# Patient Record
Sex: Male | Born: 1935
Health system: Southern US, Community
[De-identification: ages and names within clinical notes are randomized; demographics above are authoritative.]

## PROBLEM LIST (undated history)

## (undated) DIAGNOSIS — E785 Hyperlipidemia, unspecified: Secondary | ICD-10-CM

## (undated) DIAGNOSIS — I10 Essential (primary) hypertension: Secondary | ICD-10-CM

## (undated) DIAGNOSIS — N400 Enlarged prostate without lower urinary tract symptoms: Secondary | ICD-10-CM

## (undated) HISTORY — DX: Essential (primary) hypertension: I10

## (undated) HISTORY — PX: EXPLORATORY LAPAROTOMY: SUR591

## (undated) HISTORY — DX: Hyperlipidemia, unspecified: E78.5

## (undated) HISTORY — PX: FINGER SURGERY: SHX640

## (undated) HISTORY — DX: Benign prostatic hyperplasia without lower urinary tract symptoms: N40.0

---

## 2011-04-12 DIAGNOSIS — N400 Enlarged prostate without lower urinary tract symptoms: Secondary | ICD-10-CM | POA: Diagnosis not present

## 2011-04-12 DIAGNOSIS — I10 Essential (primary) hypertension: Secondary | ICD-10-CM | POA: Diagnosis not present

## 2011-04-12 DIAGNOSIS — E785 Hyperlipidemia, unspecified: Secondary | ICD-10-CM | POA: Diagnosis not present

## 2011-04-14 DIAGNOSIS — L259 Unspecified contact dermatitis, unspecified cause: Secondary | ICD-10-CM | POA: Diagnosis not present

## 2011-04-14 DIAGNOSIS — Z1212 Encounter for screening for malignant neoplasm of rectum: Secondary | ICD-10-CM | POA: Diagnosis not present

## 2011-05-12 DIAGNOSIS — L259 Unspecified contact dermatitis, unspecified cause: Secondary | ICD-10-CM | POA: Diagnosis not present

## 2011-07-25 DIAGNOSIS — E785 Hyperlipidemia, unspecified: Secondary | ICD-10-CM | POA: Diagnosis not present

## 2011-07-25 DIAGNOSIS — I10 Essential (primary) hypertension: Secondary | ICD-10-CM | POA: Diagnosis not present

## 2011-08-11 DIAGNOSIS — D235 Other benign neoplasm of skin of trunk: Secondary | ICD-10-CM | POA: Diagnosis not present

## 2011-08-11 DIAGNOSIS — L259 Unspecified contact dermatitis, unspecified cause: Secondary | ICD-10-CM | POA: Diagnosis not present

## 2011-09-07 DIAGNOSIS — E785 Hyperlipidemia, unspecified: Secondary | ICD-10-CM | POA: Diagnosis not present

## 2011-09-07 DIAGNOSIS — I10 Essential (primary) hypertension: Secondary | ICD-10-CM | POA: Diagnosis not present

## 2011-10-26 DIAGNOSIS — L259 Unspecified contact dermatitis, unspecified cause: Secondary | ICD-10-CM | POA: Diagnosis not present

## 2011-11-03 DIAGNOSIS — M542 Cervicalgia: Secondary | ICD-10-CM | POA: Diagnosis not present

## 2011-11-09 DIAGNOSIS — H903 Sensorineural hearing loss, bilateral: Secondary | ICD-10-CM | POA: Diagnosis not present

## 2011-12-21 DIAGNOSIS — E785 Hyperlipidemia, unspecified: Secondary | ICD-10-CM | POA: Diagnosis not present

## 2011-12-21 DIAGNOSIS — M542 Cervicalgia: Secondary | ICD-10-CM | POA: Diagnosis not present

## 2011-12-21 DIAGNOSIS — I10 Essential (primary) hypertension: Secondary | ICD-10-CM | POA: Diagnosis not present

## 2011-12-21 DIAGNOSIS — E559 Vitamin D deficiency, unspecified: Secondary | ICD-10-CM | POA: Diagnosis not present

## 2011-12-21 DIAGNOSIS — Z23 Encounter for immunization: Secondary | ICD-10-CM | POA: Diagnosis not present

## 2012-02-05 DIAGNOSIS — Z79899 Other long term (current) drug therapy: Secondary | ICD-10-CM | POA: Diagnosis not present

## 2012-02-05 DIAGNOSIS — I10 Essential (primary) hypertension: Secondary | ICD-10-CM | POA: Diagnosis not present

## 2012-03-20 DIAGNOSIS — Z125 Encounter for screening for malignant neoplasm of prostate: Secondary | ICD-10-CM | POA: Diagnosis not present

## 2012-03-20 DIAGNOSIS — E785 Hyperlipidemia, unspecified: Secondary | ICD-10-CM | POA: Diagnosis not present

## 2012-03-20 DIAGNOSIS — E559 Vitamin D deficiency, unspecified: Secondary | ICD-10-CM | POA: Diagnosis not present

## 2012-03-20 DIAGNOSIS — I10 Essential (primary) hypertension: Secondary | ICD-10-CM | POA: Diagnosis not present

## 2012-04-24 ENCOUNTER — Telehealth: Payer: Self-pay | Admitting: *Deleted

## 2012-04-24 NOTE — Telephone Encounter (Signed)
Verbal order from Gennette Pac, FNP for patient to try OTC Benadryl 25mg  instead of hydroxyzine.

## 2012-04-24 NOTE — Telephone Encounter (Signed)
Medicare denied hydroxyzine and pt wants a cheaper medication. Can you help out with this please?

## 2012-04-25 NOTE — Telephone Encounter (Signed)
Talked with pts son this am with pts  Permission and told him to get benadryl 25mg  for father and let us know if it does not work,

## 2012-05-01 DIAGNOSIS — R351 Nocturia: Secondary | ICD-10-CM | POA: Diagnosis not present

## 2012-05-01 DIAGNOSIS — R3915 Urgency of urination: Secondary | ICD-10-CM | POA: Diagnosis not present

## 2012-05-01 DIAGNOSIS — R972 Elevated prostate specific antigen [PSA]: Secondary | ICD-10-CM | POA: Diagnosis not present

## 2012-05-01 DIAGNOSIS — R39198 Other difficulties with micturition: Secondary | ICD-10-CM | POA: Diagnosis not present

## 2012-05-14 ENCOUNTER — Other Ambulatory Visit: Payer: Self-pay | Admitting: *Deleted

## 2012-05-14 MED ORDER — MELOXICAM 7.5 MG PO TABS: 7.5000 mg | ORAL_TABLET | Freq: Every day | ORAL | Status: DC

## 2012-05-16 ENCOUNTER — Other Ambulatory Visit: Payer: Self-pay

## 2012-05-16 MED ORDER — CLOBETASOL PROP EMOLLIENT BASE 0.05 % EX CREA: 1.0000 [drp] | TOPICAL_CREAM | Freq: Two times a day (BID) | CUTANEOUS | Status: DC

## 2012-05-27 DIAGNOSIS — R972 Elevated prostate specific antigen [PSA]: Secondary | ICD-10-CM | POA: Diagnosis not present

## 2012-05-31 DIAGNOSIS — R3915 Urgency of urination: Secondary | ICD-10-CM | POA: Diagnosis not present

## 2012-05-31 DIAGNOSIS — S61209A Unspecified open wound of unspecified finger without damage to nail, initial encounter: Secondary | ICD-10-CM | POA: Diagnosis not present

## 2012-05-31 DIAGNOSIS — IMO0002 Reserved for concepts with insufficient information to code with codable children: Secondary | ICD-10-CM | POA: Diagnosis not present

## 2012-05-31 DIAGNOSIS — S62609B Fracture of unspecified phalanx of unspecified finger, initial encounter for open fracture: Secondary | ICD-10-CM | POA: Diagnosis not present

## 2012-05-31 DIAGNOSIS — T148XXA Other injury of unspecified body region, initial encounter: Secondary | ICD-10-CM | POA: Diagnosis not present

## 2012-05-31 DIAGNOSIS — R972 Elevated prostate specific antigen [PSA]: Secondary | ICD-10-CM | POA: Diagnosis not present

## 2012-05-31 DIAGNOSIS — R351 Nocturia: Secondary | ICD-10-CM | POA: Diagnosis not present

## 2012-06-03 DIAGNOSIS — S61209A Unspecified open wound of unspecified finger without damage to nail, initial encounter: Secondary | ICD-10-CM | POA: Diagnosis not present

## 2012-06-05 DIAGNOSIS — Y998 Other external cause status: Secondary | ICD-10-CM | POA: Diagnosis not present

## 2012-06-05 DIAGNOSIS — Y9389 Activity, other specified: Secondary | ICD-10-CM | POA: Diagnosis not present

## 2012-06-05 DIAGNOSIS — IMO0002 Reserved for concepts with insufficient information to code with codable children: Secondary | ICD-10-CM | POA: Diagnosis not present

## 2012-06-05 DIAGNOSIS — Y92009 Unspecified place in unspecified non-institutional (private) residence as the place of occurrence of the external cause: Secondary | ICD-10-CM | POA: Diagnosis not present

## 2012-06-05 DIAGNOSIS — W309XXA Contact with unspecified agricultural machinery, initial encounter: Secondary | ICD-10-CM | POA: Diagnosis not present

## 2012-06-05 DIAGNOSIS — S61209A Unspecified open wound of unspecified finger without damage to nail, initial encounter: Secondary | ICD-10-CM | POA: Diagnosis not present

## 2012-06-11 ENCOUNTER — Other Ambulatory Visit: Payer: Self-pay | Admitting: Family Medicine

## 2012-06-12 NOTE — Telephone Encounter (Signed)
LAST OV 2/14. 

## 2012-06-14 DIAGNOSIS — Z4889 Encounter for other specified surgical aftercare: Secondary | ICD-10-CM | POA: Diagnosis not present

## 2012-07-01 ENCOUNTER — Other Ambulatory Visit: Payer: Self-pay

## 2012-07-01 MED ORDER — LISINOPRIL 40 MG PO TABS: 40.0000 mg | ORAL_TABLET | Freq: Every day | ORAL | Status: DC

## 2012-07-01 MED ORDER — HYDROCHLOROTHIAZIDE 25 MG PO TABS: 25.0000 mg | ORAL_TABLET | Freq: Every day | ORAL | Status: DC

## 2012-07-04 DIAGNOSIS — Z4889 Encounter for other specified surgical aftercare: Secondary | ICD-10-CM | POA: Diagnosis not present

## 2012-07-19 ENCOUNTER — Ambulatory Visit: Payer: Self-pay | Admitting: Nurse Practitioner

## 2012-07-23 ENCOUNTER — Encounter: Payer: Self-pay | Admitting: Family Medicine

## 2012-07-23 ENCOUNTER — Ambulatory Visit (INDEPENDENT_AMBULATORY_CARE_PROVIDER_SITE_OTHER): Payer: Medicare Other | Admitting: Family Medicine

## 2012-07-23 ENCOUNTER — Ambulatory Visit: Payer: Self-pay | Admitting: Family Medicine

## 2012-07-23 VITALS — BP 140/66 | HR 57 | Temp 97.0°F | Ht 64.0 in | Wt 136.4 lb

## 2012-07-23 DIAGNOSIS — I10 Essential (primary) hypertension: Secondary | ICD-10-CM | POA: Insufficient documentation

## 2012-07-23 DIAGNOSIS — N400 Enlarged prostate without lower urinary tract symptoms: Secondary | ICD-10-CM | POA: Diagnosis not present

## 2012-07-23 DIAGNOSIS — E785 Hyperlipidemia, unspecified: Secondary | ICD-10-CM | POA: Diagnosis not present

## 2012-07-23 DIAGNOSIS — M542 Cervicalgia: Secondary | ICD-10-CM | POA: Insufficient documentation

## 2012-07-23 LAB — POCT CBC
Granulocyte percent: 63.8 %G (ref 37–80)
HCT, POC: 40.7 % (ref 37.7–47.9)
Hemoglobin: 13.9 g/dL (ref 12.2–16.2)
Lymph, poc: 2.1 (ref 0.6–3.4)
MCH, POC: 31.9 pg — AB (ref 27–31.2)
MCHC: 34.2 g/dL (ref 31.8–35.4)
MCV: 93.3 fL (ref 80–97)
MPV: 7.9 fL (ref 0–99.8)
POC Granulocyte: 4.7 (ref 2–6.9)
POC LYMPH PERCENT: 28 %L (ref 10–50)
Platelet Count, POC: 203 10*3/uL (ref 142–424)
RBC: 4.4 M/uL (ref 4.04–5.48)
RDW, POC: 13.3 %
WBC: 7.4 10*3/uL (ref 4.6–10.2)

## 2012-07-23 MED ORDER — CYCLOBENZAPRINE HCL 5 MG PO TABS: 5.0000 mg | ORAL_TABLET | Freq: Three times a day (TID) | ORAL | Status: DC | PRN

## 2012-07-23 MED ORDER — MELOXICAM 7.5 MG PO TABS: 7.5000 mg | ORAL_TABLET | Freq: Every day | ORAL | Status: DC

## 2012-07-23 MED ORDER — LISINOPRIL 40 MG PO TABS: 40.0000 mg | ORAL_TABLET | Freq: Every day | ORAL | Status: DC

## 2012-07-23 MED ORDER — HYDROCHLOROTHIAZIDE 25 MG PO TABS: 25.0000 mg | ORAL_TABLET | Freq: Every day | ORAL | Status: DC

## 2012-07-23 MED ORDER — ATORVASTATIN CALCIUM 40 MG PO TABS: 40.0000 mg | ORAL_TABLET | Freq: Every day | ORAL | Status: DC

## 2012-07-23 NOTE — Progress Notes (Signed)
  Subjective:    Patient ID: Darren Rose, male    DOB: 1931-02-24, 79 y.o.   MRN: [id number]  HPI  This 79 y.o. male presents for evaluation of BPH, Hyperlipidemia, hypertension, and c/o headache which is band like.  He has hx of cervicalgia and OA.  Review of Systems    No chest pain, SOB, HA, dizziness, vision change, N/V, diarrhea, constipation, dysuria, urinary urgency or frequency, myalgias, arthralgias or rash.  Objective:   Physical Exam  Vital signs noted  Well developed well nourished male.  HEENT - Head atraumatic Normocephalic                Eyes - PERRLA, Conjuctiva - clear Sclera- Clear EOMI                Ears - EAC's Wnl TM's Wnl Gross Hearing WNL                Nose - Nares patent                 Throat - oropharanx wnl Respiratory - Lungs CTA bilateral Cardiac - RRR S1 and S2 without murmur GI - Abdomen soft Nontender and bowel sounds active x 4 Extremities - No edema. Neuro - Grossly intact.      Assessment & Plan:  Essential hypertension, benign - Plan: POCT CBC, COMPLETE METABOLIC PANEL WITH GFR Controlled and continue current.  BPH (benign prostatic hyperplasia) - Plan: POCT CBC, COMPLETE METABOLIC PANEL WITH GFR No increased nocturia.    Cervicalgia - Plan: Continue Mobic and take flexeril otc and this will also help with his headache.  Other and unspecified hyperlipidemia - Plan: POCT CBC, COMPLETE METABOLIC PANEL WITH GFR.  Continue atorvastatin.

## 2012-07-23 NOTE — Patient Instructions (Signed)

## 2012-07-24 LAB — COMPLETE METABOLIC PANEL WITH GFR
ALT: 23 U/L (ref 0–35)
AST: 24 U/L (ref 0–37)
Albumin: 4 g/dL (ref 3.5–5.2)
Alkaline Phosphatase: 64 U/L (ref 39–117)
BUN: 22 mg/dL (ref 6–23)
CO2: 30 mEq/L (ref 19–32)
Calcium: 9.8 mg/dL (ref 8.4–10.5)
Chloride: 100 mEq/L (ref 96–112)
Creat: 0.65 mg/dL (ref 0.50–1.10)
GFR, Est African American: >89 mL/min
GFR, Est Non African American: 85 mL/min
Glucose, Bld: 88 mg/dL (ref 70–99)
Potassium: 4.4 mEq/L (ref 3.5–5.3)
Sodium: 143 mEq/L (ref 135–145)
Total Bilirubin: 0.5 mg/dL (ref 0.3–1.2)
Total Protein: 6.7 g/dL (ref 6.0–8.3)

## 2012-07-24 NOTE — Progress Notes (Signed)
  Subjective:    Patient ID: Darren Rose, male    DOB: 1931/04/06, 79 y.o.   MRN: [id number]  HPI    Review of Systems     Objective:   Physical Exam   Vital signs noted  Well developed well nourished male in NAD.  HEENT - Head atraumatic Normocephalic                Eyes - PERRLA, Conjuctiva - clear Sclera- Clear EOMI                Ears - EAC's Wnl TM's Wnl Gross Hearing WNL                Nose - Nares patent                 Throat - oropharanx wnl Respiratory - Lungs CTA bilateral Cardiac - RRR S1 and S2 without murmur GI - Abdomen soft Nontender and bowel sounds active x 4 Extremities - No edema. Neuro - Grossly intact.     Assessment & Plan:  Patient had wrong dx of BPH entered.

## 2012-08-09 ENCOUNTER — Other Ambulatory Visit: Payer: Self-pay

## 2012-08-09 MED ORDER — ATORVASTATIN CALCIUM 40 MG PO TABS: 40.0000 mg | ORAL_TABLET | Freq: Every day | ORAL | Status: DC

## 2012-08-14 ENCOUNTER — Other Ambulatory Visit: Payer: Self-pay | Admitting: Nurse Practitioner

## 2012-09-06 ENCOUNTER — Ambulatory Visit: Payer: Self-pay | Admitting: Nurse Practitioner

## 2012-10-02 ENCOUNTER — Other Ambulatory Visit: Payer: Self-pay | Admitting: Family Medicine

## 2012-10-29 ENCOUNTER — Other Ambulatory Visit: Payer: Self-pay | Admitting: Family Medicine

## 2012-11-06 ENCOUNTER — Other Ambulatory Visit: Payer: Self-pay | Admitting: Family Medicine

## 2012-11-22 ENCOUNTER — Ambulatory Visit (INDEPENDENT_AMBULATORY_CARE_PROVIDER_SITE_OTHER): Payer: Medicare Other | Admitting: Family Medicine

## 2012-11-22 VITALS — BP 132/73 | HR 60 | Temp 97.8°F | Ht 64.0 in | Wt 140.0 lb

## 2012-11-22 DIAGNOSIS — M129 Arthropathy, unspecified: Secondary | ICD-10-CM | POA: Diagnosis not present

## 2012-11-22 DIAGNOSIS — N400 Enlarged prostate without lower urinary tract symptoms: Secondary | ICD-10-CM | POA: Diagnosis not present

## 2012-11-22 DIAGNOSIS — M1990 Unspecified osteoarthritis, unspecified site: Secondary | ICD-10-CM

## 2012-11-22 DIAGNOSIS — I10 Essential (primary) hypertension: Secondary | ICD-10-CM | POA: Diagnosis not present

## 2012-11-22 LAB — POCT CBC
Granulocyte percent: 64 %G (ref 37–80)
HCT, POC: 42.1 % — AB (ref 43.5–53.7)
Hemoglobin: 14 g/dL — AB (ref 14.1–18.1)
Lymph, poc: 1.9 (ref 0.6–3.4)
MCH, POC: 30.7 pg (ref 27–31.2)
MCHC: 33.3 g/dL (ref 31.8–35.4)
MCV: 92.2 fL (ref 80–97)
MPV: 8.3 fL (ref 0–99.8)
POC Granulocyte: 4.2 (ref 2–6.9)
POC LYMPH PERCENT: 29 %L (ref 10–50)
Platelet Count, POC: 192 10*3/uL (ref 142–424)
RBC: 4.6 M/uL — AB (ref 4.69–6.13)
RDW, POC: 13.2 %
WBC: 6.6 10*3/uL (ref 4.6–10.2)

## 2012-11-22 MED ORDER — TAMSULOSIN HCL 0.4 MG PO CAPS: 0.4000 mg | ORAL_CAPSULE | Freq: Every day | ORAL | Status: DC

## 2012-11-22 MED ORDER — MELOXICAM 7.5 MG PO TABS: 7.5000 mg | ORAL_TABLET | Freq: Every day | ORAL | Status: DC

## 2012-11-22 MED ORDER — CYCLOBENZAPRINE HCL 5 MG PO TABS: 5.0000 mg | ORAL_TABLET | Freq: Two times a day (BID) | ORAL | Status: DC | PRN

## 2012-11-22 NOTE — Patient Instructions (Signed)

## 2012-11-22 NOTE — Progress Notes (Signed)
  Subjective:    Patient ID: Darren Rose, male    DOB: 1931/03/22, 79 y.o.   MRN: [id number]  HPI This 79 y.o. male presents for evaluation of follow up.  He has Hx of hypertension, hyperlipidemia,and OA..   Review of Systems No chest pain, SOB, HA, dizziness, vision change, N/V, diarrhea, constipation, dysuria, urinary urgency or frequency, myalgias, arthralgias or rash.     Objective:   Physical Exam Vital signs noted  Well developed well nourished male.  HEENT - Head atraumatic Normocephalic                Eyes - PERRLA, Conjuctiva - clear Sclera- Clear EOMI                Ears - EAC's Wnl TM's Wnl Gross Hearing WNL                Nose - Nares patent                 Throat - oropharanx wnl Respiratory - Lungs CTA bilateral Cardiac - RRR S1 and S2 without murmur GI - Abdomen soft Nontender and bowel sounds active x 4 Extremities - No edema. Neuro - Grossly intact.       Assessment & Plan:  BPH (benign prostatic hyperplasia) - Plan: tamsulosin (FLOMAX) 0.4 MG CAPS capsule, POCT CBC, CMP14+EGFR  Arthritis - Plan: meloxicam (MOBIC) 7.5 MG tablet, cyclobenzaprine (FLEXERIL) 5 MG tablet, POCT CBC, CMP14+EGFR  Essential hypertension, benign - Plan: POCT CBC, CMP14+EGFR  Deatra Canter FNP

## 2012-11-23 LAB — CMP14+EGFR
ALT: 18 IU/L (ref 0–44)
AST: 20 IU/L (ref 0–40)
Albumin/Globulin Ratio: 1.9 (ref 1.1–2.5)
Albumin: 4.3 g/dL (ref 3.5–4.8)
Alkaline Phosphatase: 81 IU/L (ref 39–117)
BUN/Creatinine Ratio: 40 — ABNORMAL HIGH (ref 10–22)
BUN: 27 mg/dL (ref 8–27)
CO2: 28 mmol/L (ref 18–29)
Calcium: 9.6 mg/dL (ref 8.6–10.2)
Chloride: 99 mmol/L (ref 97–108)
Creatinine, Ser: 0.67 mg/dL — ABNORMAL LOW (ref 0.76–1.27)
GFR calc Af Amer: 106 mL/min/{1.73_m2} (ref >59)
GFR calc non Af Amer: 91 mL/min/{1.73_m2} (ref >59)
Globulin, Total: 2.3 g/dL (ref 1.5–4.5)
Glucose: 95 mg/dL (ref 65–99)
Potassium: 4.6 mmol/L (ref 3.5–5.2)
Sodium: 142 mmol/L (ref 134–144)
Total Bilirubin: 0.4 mg/dL (ref 0.0–1.2)
Total Protein: 6.6 g/dL (ref 6.0–8.5)

## 2012-12-23 ENCOUNTER — Other Ambulatory Visit: Payer: Self-pay

## 2012-12-23 MED ORDER — ATORVASTATIN CALCIUM 40 MG PO TABS: 40.0000 mg | ORAL_TABLET | Freq: Every day | ORAL | Status: DC

## 2012-12-23 NOTE — Telephone Encounter (Signed)
ntbs

## 2012-12-23 NOTE — Telephone Encounter (Signed)
Last lipid 03/20/12  [facility]

## 2012-12-24 ENCOUNTER — Telehealth: Payer: Self-pay | Admitting: *Deleted

## 2012-12-24 NOTE — Telephone Encounter (Signed)
Left message,NTBS.

## 2013-01-20 ENCOUNTER — Ambulatory Visit: Payer: Medicare Other | Admitting: Family Medicine

## 2013-01-21 ENCOUNTER — Ambulatory Visit (INDEPENDENT_AMBULATORY_CARE_PROVIDER_SITE_OTHER): Payer: Medicare Other | Admitting: General Practice

## 2013-01-21 ENCOUNTER — Ambulatory Visit (INDEPENDENT_AMBULATORY_CARE_PROVIDER_SITE_OTHER): Payer: Medicare Other

## 2013-01-21 ENCOUNTER — Encounter: Payer: Self-pay | Admitting: General Practice

## 2013-01-21 VITALS — BP 144/65 | HR 60 | Temp 97.3°F | Ht 64.0 in | Wt 138.0 lb

## 2013-01-21 DIAGNOSIS — R52 Pain, unspecified: Secondary | ICD-10-CM | POA: Diagnosis not present

## 2013-01-21 DIAGNOSIS — Z23 Encounter for immunization: Secondary | ICD-10-CM

## 2013-01-21 DIAGNOSIS — L84 Corns and callosities: Secondary | ICD-10-CM | POA: Diagnosis not present

## 2013-01-21 MED ORDER — ATORVASTATIN CALCIUM 40 MG PO TABS: 40.0000 mg | ORAL_TABLET | Freq: Every day | ORAL | Status: DC

## 2013-01-21 NOTE — Patient Instructions (Signed)
Callos y callosidades (Corns and Calluses) Un callo es una pequea zona de piel ms gruesa que se desarrolla en la parte superior, los costados, y las puntas de un dedo del pie. Contienen un ncleo con forma de cono que puede presionar un nervio que se encuentre debajo y causar dolor. Los callos son reas de piel engrosada que se desarrollan en las manos, los dedos, las palmas de las manos, las plantas de los pies y los talones. Estas son las reas que experimentan friccin o presin frecuente.  CAUSAS  Los callos son generalmente el resultado de roce (friccin) o la presin de los zapatos que son demasiado apretados o no se ajustan adecuadamente. Los callos son causados   por la friccin y la presin repetida sobre las zonas afectadas.  SNTOMAS  Un crecimiento de piel dura en los dedos de los pies.  Dolor o sensibilidad en la piel.  A veces, enrojecimiento e hinchazn.  Mayor dolor con el uso de zapatos ajustados. DIAGNSTICO  El mdico puede diagnosticar el problema haciendo un examen fsico.  TRATAMIENTO  Eliminacin de la causa de la friccin o la presin es generalmente el nico tratamiento necesario. Sin embargo, a veces pueden utilizarse medicamentos para ayudar a ablandar las zonas endurecidas y engrosadas. Estos medicamentos incluyen apsitos de cido saliclico y una locin de lactato de amonio al 12%. Estos medicamentos slo deben utilizarse bajo la supervisin de su mdico.  INSTRUCCIONES PARA EL CUIDADO DOMICILIARIO  Intente eliminar la presin de la zona afectada.  Puede proteger la piel con almohadillas para callos con forma de aro.  Puede usar una piedra pmez o una lima de uas metlica con suavidad para reducir el grosor del callo.  Use calzado bien ajustado.  Si tiene callos en las manos, utilice guantes al realizar actividades que puedan causar friccin.  Las personas diabticas deber controlar regularmente sus pies y comunicarse con el mdico de cabecera si notan  problemas en ellos. SOLICITE ATENCIN MDICA DE INMEDIATO SI:  Ha aumentado el dolor, hinchazn, enrojecimiento o calor en la zona afectada.  Su callo comienza a drenar el lquido o sangra.  Usted no est mejorando, incluso con tratamiento. Document Released: 10/26/2007 Document Revised: 04/10/2011 [employer] Patient Information 2014 [employer].  

## 2013-01-21 NOTE — Progress Notes (Signed)
   Subjective:    Patient ID: Darren Rose, male    DOB: 01/29/1932, 79 y.o.   MRN: [id number]  HPI Patient presents today today with complaints of right foot pain, between 3rd and 4th toe. Reports onset was several months ago and he has used OTC medication for callus removal. Reports also using knife to trim callus area down.  Patient is accompanied by his family and a Nurse, learning disability, due to being spanish speaking only.    Review of Systems  Constitutional: Negative for fever and chills.  Respiratory: Negative for chest tightness and shortness of breath.   Cardiovascular: Negative for chest pain and palpitations.  Musculoskeletal:       Right foot pain, between 3rd and 4th toes  All other systems reviewed and are negative.       Objective:   Physical Exam  Constitutional: He is oriented to person, place, and time. He appears well-developed and well-nourished.  Cardiovascular: Normal rate, regular rhythm and normal heart sounds.   Pulmonary/Chest: Effort normal and breath sounds normal. No respiratory distress. He exhibits no tenderness.  Musculoskeletal:  Callous noted to right foot 3rd toe and pressing against 4th toe. Negative broken skin to 4th toe.   Neurological: He is alert and oriented to person, place, and time.  Skin: Skin is warm and dry.  Psychiatric: He has a normal mood and affect.    [facility] reading (PRIMARY) by Coralie Keens, FNP-C, no fracture, dislocation, or bone spur noted.                                     Assessment & Plan:  1. Pain  - DG Foot Complete Right; Future  2. Callus of foot  - Ambulatory referral to Podiatry -keep feet clean and dry -information provided and discussed about callus -RTO if symptoms worsen, prior to podiatrist visit Patient verbalized understanding Coralie Keens, FNP-C

## 2013-02-03 DIAGNOSIS — L259 Unspecified contact dermatitis, unspecified cause: Secondary | ICD-10-CM | POA: Diagnosis not present

## 2013-02-12 ENCOUNTER — Ambulatory Visit: Payer: Medicare Other | Admitting: Podiatry

## 2013-02-18 DIAGNOSIS — M79609 Pain in unspecified limb: Secondary | ICD-10-CM | POA: Diagnosis not present

## 2013-02-18 DIAGNOSIS — M2040 Other hammer toe(s) (acquired), unspecified foot: Secondary | ICD-10-CM | POA: Diagnosis not present

## 2013-02-26 ENCOUNTER — Other Ambulatory Visit: Payer: Self-pay | Admitting: Family Medicine

## 2013-02-28 ENCOUNTER — Other Ambulatory Visit: Payer: Self-pay | Admitting: *Deleted

## 2013-02-28 MED ORDER — ATORVASTATIN CALCIUM 40 MG PO TABS
40.0000 mg | ORAL_TABLET | Freq: Every day | ORAL | Status: DC
Start: 2013-02-28 — End: 2013-04-24

## 2013-03-11 DIAGNOSIS — M779 Enthesopathy, unspecified: Secondary | ICD-10-CM | POA: Diagnosis not present

## 2013-03-11 DIAGNOSIS — M79609 Pain in unspecified limb: Secondary | ICD-10-CM | POA: Diagnosis not present

## 2013-04-01 DIAGNOSIS — M779 Enthesopathy, unspecified: Secondary | ICD-10-CM | POA: Diagnosis not present

## 2013-04-01 DIAGNOSIS — M79609 Pain in unspecified limb: Secondary | ICD-10-CM | POA: Diagnosis not present

## 2013-04-01 DIAGNOSIS — M25579 Pain in unspecified ankle and joints of unspecified foot: Secondary | ICD-10-CM | POA: Diagnosis not present

## 2013-04-24 ENCOUNTER — Other Ambulatory Visit: Payer: Self-pay | Admitting: *Deleted

## 2013-04-24 MED ORDER — ATORVASTATIN CALCIUM 40 MG PO TABS: 40.0000 mg | ORAL_TABLET | Freq: Every day | ORAL | Status: DC

## 2013-04-29 DIAGNOSIS — M25579 Pain in unspecified ankle and joints of unspecified foot: Secondary | ICD-10-CM | POA: Diagnosis not present

## 2013-04-29 DIAGNOSIS — M779 Enthesopathy, unspecified: Secondary | ICD-10-CM | POA: Diagnosis not present

## 2013-05-13 DIAGNOSIS — M2040 Other hammer toe(s) (acquired), unspecified foot: Secondary | ICD-10-CM | POA: Diagnosis not present

## 2013-05-22 DIAGNOSIS — M2040 Other hammer toe(s) (acquired), unspecified foot: Secondary | ICD-10-CM | POA: Diagnosis not present

## 2013-05-22 DIAGNOSIS — I10 Essential (primary) hypertension: Secondary | ICD-10-CM | POA: Diagnosis not present

## 2013-05-22 DIAGNOSIS — Z79899 Other long term (current) drug therapy: Secondary | ICD-10-CM | POA: Diagnosis not present

## 2013-05-22 DIAGNOSIS — M129 Arthropathy, unspecified: Secondary | ICD-10-CM | POA: Diagnosis not present

## 2013-05-22 DIAGNOSIS — L84 Corns and callosities: Secondary | ICD-10-CM | POA: Diagnosis not present

## 2013-05-23 DIAGNOSIS — M2040 Other hammer toe(s) (acquired), unspecified foot: Secondary | ICD-10-CM | POA: Diagnosis not present

## 2013-05-26 ENCOUNTER — Encounter: Payer: Medicare Other | Admitting: Family Medicine

## 2013-05-26 ENCOUNTER — Other Ambulatory Visit: Payer: Self-pay | Admitting: Family Medicine

## 2013-05-26 NOTE — Progress Notes (Signed)
   Subjective:    Patient ID: Darren Rose, male    DOB: 12-12-1931, 80 y.o.   MRN: [id number]  HPI    Review of Systems     Objective:   Physical Exam        Assessment & Plan:

## 2013-05-27 NOTE — Telephone Encounter (Signed)
Last seen 01/21/13 Mae  No Lipids in [facility]

## 2013-06-03 DIAGNOSIS — M2040 Other hammer toe(s) (acquired), unspecified foot: Secondary | ICD-10-CM | POA: Diagnosis not present

## 2013-06-04 DIAGNOSIS — R972 Elevated prostate specific antigen [PSA]: Secondary | ICD-10-CM | POA: Diagnosis not present

## 2013-06-09 DIAGNOSIS — R972 Elevated prostate specific antigen [PSA]: Secondary | ICD-10-CM | POA: Diagnosis not present

## 2013-06-17 ENCOUNTER — Encounter: Payer: Self-pay | Admitting: Family

## 2013-06-17 ENCOUNTER — Ambulatory Visit (INDEPENDENT_AMBULATORY_CARE_PROVIDER_SITE_OTHER): Payer: Medicare Other | Admitting: Family

## 2013-06-17 VITALS — BP 136/71 | HR 55 | Temp 97.6°F | Ht 64.0 in | Wt 133.0 lb

## 2013-06-17 DIAGNOSIS — E785 Hyperlipidemia, unspecified: Secondary | ICD-10-CM | POA: Diagnosis not present

## 2013-06-17 DIAGNOSIS — I10 Essential (primary) hypertension: Secondary | ICD-10-CM

## 2013-06-17 DIAGNOSIS — Z23 Encounter for immunization: Secondary | ICD-10-CM

## 2013-06-17 DIAGNOSIS — N400 Enlarged prostate without lower urinary tract symptoms: Secondary | ICD-10-CM | POA: Diagnosis not present

## 2013-06-17 DIAGNOSIS — Z0000 Encounter for general adult medical examination without abnormal findings: Secondary | ICD-10-CM

## 2013-06-17 MED ORDER — TAMSULOSIN HCL 0.4 MG PO CAPS: 0.4000 mg | ORAL_CAPSULE | Freq: Every day | ORAL | Status: DC

## 2013-06-17 MED ORDER — ATORVASTATIN CALCIUM 40 MG PO TABS: 40.0000 mg | ORAL_TABLET | Freq: Every day | ORAL | Status: DC

## 2013-06-17 NOTE — Addendum Note (Signed)
Addended by: Ilean China on: 06/17/2013 04:38 PM   Modules accepted: Orders

## 2013-06-17 NOTE — Patient Instructions (Addendum)
Mantenimiento de [employer]. (Health Maintenance, Males) Un estilo de vida saludable y los cuidados preventivos pueden favorecer la salud y [place].  No deje de [employer] de rutina de la salud, dentales y de Public librarian.  Consuma una dieta saludable. Los [employer], frutas, cereales integrales, productos lcteos descremados y protenas magras contienen los nutrientes que usted necesita y no tienen muchas caloras. Disminuya el consumo de alimentos ricos en grasas slidas, azcar y sal agregadas. Si es necesario, pdale informacin acerca de [place] a su mdico.  La actividad fsica regular es una de las cosas ms importantes que puede hacer por su salud. Los adultos deben hacer al menos 150 minutos de ejercicios de intensidad moderada (cualquier actividad que aumente la frecuencia cardaca y lo haga transpirar) todas las semana. Adems, la mayora de los adultos necesita realizar ejercicios de fortalecimiento muscular 2 o ms veces por semana.  Mantenga un peso saludable. El ndice de masa corporal [facility]) es una herramienta que identifica posibles problemas con [place]. Proporciona una estimacin de la grasa corporal basndose en el peso y la altura. El mdico podr determinar su [facility] y ayudarlo a Scientist, forensic o Theatre manager un peso saludable. Para los adultos mayores de 20aos:  Un [place] por debajo de 18,5 se considera bajo peso.  Un [facility] entre 18,5 y 24,9 es normal.  Un IMS entre 25 y 29,9 se considera sobrepeso.  Un IMC por encima de 30 se considera obesidad.  Mantenga un nivel normal de lpidos y colesterol en la sangre practicando actividad fsica y minimizando la ingesta de grasas saturadas. Consuma una dieta balanceada e incluya variedad de frutas y vegetales. A partir de los 20 aos, se deben [employer] de lpidos y Research officer, trade union y repetirse cada 5 aos. Si los niveles de colesterol o lpidos son elevados, tiene ms de 50aos o tiene mayor  riesgo de sufrir enfermedades cardacas, puede necesitar controlarse los niveles de colesterol con ms frecuencia. Si sus niveles elevados de lpidos y colesterol son Cori Razor, y si la dieta y el ejercicio no dan resultado, entonces debe recibir tratamiento con medicamentos.  Si fuma, consulte con el mdico acerca de las opciones para abandonar este hbito. Si no fuma, no comience.  Es recomendable que las personas adultas de entre 69 y 44aos que estn en riesgo de Horticulturist, commercial de pulmn por sus antecedentes de consumo de tabaco, se realicen los exmenes de deteccin correspondientes. Para quienes hayan fumado 30 paquetes por ao y sigan fumando o hayan dejado el hbito en los ltimos 15aos, se recomienda realizarse una tomografa computarizada de baja dosis de los [employer]. Paquete por ao es la forma de medir la cantidad de cigarrillos que una persona ha fumado durante un perodo. Fumar un paquete por ao equivale a fumar un promedio de un paquete de cigarrillos diario durante un ao (por ejemplo, fumar 30paquetes por ao podra significar fumar un paquete de cigarrillos diario durante 30aos o 2paquetes diarios durante 15aos). Se deben realizar estos exmenes todos los aos hasta que el fumador haya dejado de fumar durante un mnimo de 15 aos. No deben realizarse en personas que tengan un problema de salud que les impida recibir tratamiento para el cncer de pulmn.  Si decide tomar alcohol, no beba ms de [employer]. Se considera una medida 12onzas (34m) de cerveza, 5onzas (1572m de vino o 1,5,[id number] licor.  Evite el alcohol y el consumo de drogas.  No comparta agujas. Pida ayuda si necesita asistencia o instrucciones con respecto a abandonar el consumo de alcohol, cigarrillos o drogas.  La hipertensin arterial causa enfermedades cardacas y [place] el riesgo de ictus. Debe controlar su presin arterial al menos cada uno o [place]. La presin  arterial elevada que persiste debe tratarse con medicamentos si la prdida de peso y el ejercicio no son efectivos.  Si tiene entre 44 y 64 aos, consulte a su mdico si debe tomar aspirina para prevenir enfermedades cardacas.  Los anlisis para la diabetes implican tomar Truddie Coco de sangre para controlar el nivel de azcar en la sangre en ayuno. Debe hacerlos cada 3aos despus de los 21aos si su peso es normal y no tiene factores de riesgo de diabetes. Si tiene sobrepeso y cuenta con al menos un factor de riesgo, entonces lo mejor es controlarse y Dispensing optician las pruebas correspondientes desde joven o con ms frecuencia.  El cncer colorrectal puede detectarse y, con frecuencia, puede prevenirse. La mayor parte de los estudios de rutina de control de Surveyor, minerals comienzan a Dispensing optician a los 1 aos y [employer] 37 aos. Sin embargo, el mdico podr aconsejarle que lo haga antes si tiene factores de riesgo de cncer de colon. Una vez por ao, el mdico le dar un kit de prueba casera para Hydrologist en la materia fecal. La utilizacin de una pequea cmara en el extremo de un tubo para examinar directamente el colon (sigmoidoscopia o colonoscopia) puede detectar formas tempranas de cncer colorrectal. Hable con su mdico si tiene 25aos, edad en la que comienzan a realizarse los estudios de [place]. El examen directo del colon debe repetirse cada 5 a 10aos, hasta los 75aos, excepto que se encuentren formas tempranas de plipos precancerosos o pequeas masas.  Las personas con un riesgo mayor de hepatitis B deben realizarse anlisis para Futures trader virus. Se considera que tiene un alto riesgo de hepatitis B si:  Naci en un pas donde la hepatitis B es frecuente. Pregntele a su mdico qu pases son considerados de Public affairs consultant.  Sus padres nacieron en un pas de alto riesgo y usted no recibi la vacuna contra la hepatitis B.  [place].  [place] agujas para  inyectarse drogas ilegales.  Vive o tiene sexo con alguien que tiene hepatitis B.  Es un hombre que tiene sexo con otros hombres.  Recibe tratamiento de hemodilisis.  Toma ciertos medicamentos para Chief Operating Officer, trasplante de rganos y afecciones autoinmunes.  Se recomienda realizar un anlisis de sangre para Hydrographic surveyor hepatitis C a todas las personas nacidas entre 1945 y 1965, y a toda Insurance claims handler persona que tenga un riesgo de haber contrado esta enfermedad.  Los hombres sanos no deben hacerse anlisis de sangre para Hydrographic surveyor antgenos especficos prostticos como parte de los estudios de rutina para Science writer. Pregntele a su mdico sobre las pruebas de deteccin de cncer de prstata.  La evaluacin del cncer de testculos no se recomienda en hombres adolescentes ni adultos que no tengan sntomas. La evaluacin incluye el autoexamen, el examen por parte del profesional y otras pruebas diagnsticas. Consulte con su mdico si tiene algn sntoma o preocupaciones acerca del cncer de testculos.  Practique el sexo seguro. Use condones y evite las prcticas sexuales riesgosas para disminuir el contagio de enfermedades de transmisin sexual (ETS).  Use protector solar. Aplique el protector muchas veces al da. Resgurdese del sol cuando su propia sombra sea ms pequea que usted.  Protjase usando mangas y [employer], un sombrero de ala ancha y anteojos de sol todo el ao, siempre que se Scientific laboratory technician.  Informe a su mdico si aparecen nuevos lunares o los que tiene se modifican, especialmente en forma y color. Tambin notifique al mdico si un lunar es ms grande que el tamao de una goma de Games developer.  Si tiene entre 41 y los 47aos, y es o ha sido fumador, se recomienda un estudio con ecografa para Environmental manager de aorta abdominal (AAA) y su eventual reparacin [place].  Sauget (inmunizaciones). Document Released: 07/15/2007  Document Revised: 11/06/2012 [facility] Patient Information 2014 [place], [place].   Start on Melatonin OTC- 0.18m every night at bedtime  VWestern Saharadifteria, ttanos, tos ferina (DTP) - Lo que debe saber  (Tetanus, Diphtheria, Pertussis [Tdap] Vaccine, What You Need to Know) PORQU VACUNARSE?  El ttanos, la difteria y la tos fDyann Ruddlepueden ser enfermedades muy graves, an en adolescentes y aHoncut La vacuna Tdap nos puede proteger de estas enfermedades.  El TTANOS (Trismo) provoca la contraccin dolorosa de los msculos, por lo general, en todo el cuerpo.   Puede causar el endurecimiento de los msculos de la cabeza y el cuello, de modo que impide abrir la boca, tragar y en algunos casos, rAmbulance person El ttanos causa la muerte de 1 de cada 5 personas que se infectan. La DIFTERIA produce la formacin de una membrana gruesa que cubre el fondo de la garganta.   Puede causar problemas respiratorios, parlisis, insuficiencia cardaca e incluso la muerte. TOS FERINA (Pertusis) causa episodios de tos graves, que pueden hacer difcil la respiracin, causar vmitos y trastornos del sueo.   Tambin puede ser la causa de prdida de p[place] incontinencia y fMetallurgistde cTheatre stage manager Dos de cada 100 adolescentes y cArchitectde cada 100 adultos que enferman de pertusis deben ser hospitalizados, tienen complicaciones como la neumona o m[place] Estas enfermedades son provocadas por bacterias. La difteria y el pertusis se contagian de persona a persona a travs de la tos o el estornudo. El ttanos ingresa al organismo a travs de cortes, rasguos o heridas.  Antes de las vacunas, en los [place] se vieron ms de 200.000 casos al ao de difteria y tos fComorosy cientos de casos de ttanos. Desde el inicio de la vacunacin, los casos de ttanos y difteria han disminuido alrededor del 99% y los casos de tos ferina alrededor del 80%.  Tdap  La vacuna Tdap protege a adolescentes y a25contra el ttanos, la difteria y la  tos fPhilom[place] Una dosis de Tdap se administra a los 11 o 12 aos de edad. Las pIllinois Tool Worksno recibieron la vacuna Tdap a esa edad deben recibirla tan pronto como sea posible.  Es muy importante que los profesionales de la salud y todos aquellos que tengan contacto cercano con bebs menores de 12 meses reciban la Tdap.  Las mujeres embarazadas deben recibir una dosis de Tdap en cada eMedia planner para proteger al recin nacido de la tos fBur[place] Los nios tienen mayor riesgo de complicaciones graves y potencialmente mortales debido a la tos [place]ok[place]  Una vacuna similar, llamada Td, protege contra el ttanos y la difteria, pero no contra la tos fMay[place] Cada 10 aos debe recibirse un refuerzo de Td. La Tdap se puede administrar como uno de estos refuerzos, si todava no ha recibido una dosis. Tambin se puede aplicar despus de un corte o quemadura grave para prevenir la infeccin por ttanos.  El mdico le dar ms informacin.  La Tdap puede administrarse de manera segura simultneamente con otras vacunas.  ALGUNAS PERSONAS NO DEBEN RECIBIR ESTA VACUNA.   Si alguna vez tuvo una reaccin alrgica potencialmente mortal despus de Ardelia Mems dosis de la vacuna contra el ttanos, la diferia o la tos [place], o tuvo una alergia grave a cualquiera de los componentes de esta vacuna, no debe aplicarse la vacuna. Informe a su mdico si usted sufre algn tipo de alergia grave.  Si estuvo en coma o sufri mltiples convulsiones dentro de los 7 das posteriores despus de una dosis de DTP o DTaP no debe recibir la Tdap, salvo que se encuentre otra causa En este caso puede recibir la Td.  Consulte con su mdico si:  tiene epilepsia u otra enfermedad del sistema nervioso,  siente dolor intenso o se hincha despus de recibir cualquier vacuna contra la difteria, el ttanos o la tos [place],  alguna vez ha sufrido el sndrome de Curator,  no se siente Pharmacologist en que se ha programado la vacuna. RIESGOS DE UNA REACCIN  A LA VACUNA Con cualquier medicamento, incluyendo las vacunas, existe la posibilidad de que aparezcan efectos secundarios. Estos son leves y desaparecen por s solos, pero tambin son posibles las reacciones graves.  Breves episodios de [employer] seguir a una vacunacin, causando lesiones por la cada. Sentarse o recostarse durante 15 minutos puede ayudar a Scientist, clinical (histocompatibility and immunogenetics). Informe al mdico si se siente mareado o aturdido, tiene [employer] visin o zumbidos en los odos.  Problemas leves luego de la Tdap (no interferirn con las actividades)   Dolor en el sitio de la inyeccin (alrededor de 1 de cada 4 adolescentes o 2 de cada 3 adultos).  Enrojecimiento o hinchazn en el lugar de la inyeccin (1 de cada 5 personas).  Fiebre leve de al menos 100,4 F (38 C) (hasta alrededor de 1 cada 25 adolescentes y 1 de cada 100 adultos).  Dolor de cabeza (3 o 4de cada 10 personas).  Cansancio (1 de cada 3 o 4 personas).  Nuseas, vmitos, diarrea, dolor de estmago (1 de cada 4 adolescentes o 1 de cada 10 adultos).  Escalofros, dolores corporales, dolor articular, erupciones, inflamacin de las glndulas (poco frecuente). Problemas moderados: (interfieren con las actividades, pero no requieren atencin mdica)   Management consultant de la inyeccin (1 de cada 5 adolescentes o 1 de cada 100 adultos).  Enrojecimiento o inflamacin (1 de cada 16 adolescentes y 1 de cada 25 adultos).  Fiebre de ms de 102F o 38,9C (1 de cada 100 adolescentes o 1 de cada 250 adultos).  Dolor de cabeza (alrededor de 4 de cada 20 adolescentes y 3 de cada 10 adultos).  Nuseas, vmitos, diarrea, dolor de estmago (1 a 3 de cada 100 personas).  Hinchazn de todo el brazo en el que se aplic la vacuna (3 de [id number] personas). Problemas graves: luego de la Tdap (no puede Optometrist las actividades habituales, requiere atencin mdica)   Inflamacin, dolor intenso, sangrado y enrojecimiento en el brazo, en el sitio de la  inyeccin (poco frecuente). Una reaccin alrgica grave puede ocurrir despus de la administracin de cualquier vacuna (se estima en menos de 1 en un milln de dosis).  QU PASA SI HAY UNA REACCIN GRAVE?  Qu signos debo buscar?  Observe todo lo que le preocupe, como signos de una reaccin alrgica grave, fiebre muy alta o cambios en el comportamiento. Los signos de Nurse, mental health grave  pueden incluir urticaria, hinchazn de la cara y la garganta, dificultad para respirar, ritmo cardaco acelerado, mareos y debilidad. Estos sntomas pueden comenzar entre unos pocos minutos y algunas horas despus de la vacunacin.  Qu debo hacer?  Si usted piensa que se trata de una reaccin alrgica grave o de otra emergencia que no puede esperar, llame al 911 o lleve a la persona al hospital ms cercano. De lo contrario, llame a su mdico.  Despus, la reaccin debe informarse a la "Vaccine Adverse Event Reporting System" (Sistema de informacin sobre efectos [place]). El mdico o usted mismo pueden Multimedia programmer informe en el sitio web del VAERS [email or web address] llame al [phone]. El VAERS es slo para Electrical engineer. No brindan consejo mdico.  PROGRAMA NACIONAL DE COMPENSACIN DE DAOS POR [place] Vaccine Injury Compensation Program (VICP) es un programa federal que fue creado para compensar a las personas que puedan haber sufrido daos al recibir ciertas vacunas.  Aquellas personas que consideren que han sufrido un dao como consecuencia de una vacuna y quieren saber ms acerca del programa y como presentar Raechel [place], pueden llamar [phone] o visite el sitio web del VICP en [email or web address].  [place] INFORMACIN?   Consulte a su mdico.  Comunquese con el servicio de salud de su localidad o su estado.  Comunquese con los Centros para el control y la prevencin de Probation officer for Disease Control  and Prevention , CDC).  llamando al [phone] o visitando el sitio web del CDC en http://hunter.com/. CDC Tdap Vaccine VIS (06/08/11)  Document Released: 01/03/2012 [facility] Patient Information 2014 [place].

## 2013-06-17 NOTE — Progress Notes (Signed)
Subjective:    Patient ID: Darren Rose, male    DOB: August 19, 1931, 80 y.o.   MRN: [id number]  Hypertension This is a chronic problem. The current episode started more than 1 year ago. The problem has been resolved since onset. The problem is controlled. Pertinent negatives include no anxiety, blurred vision, chest pain, headaches, palpitations, peripheral edema or shortness of breath. Risk factors for coronary artery disease include dyslipidemia, male gender and smoking/tobacco exposure. Past treatments include ACE inhibitors and diuretics. The current treatment provides moderate improvement. There is no history of kidney disease or a thyroid problem. There is no history of sleep apnea.  Hyperlipidemia This is a chronic problem. The current episode started more than 1 year ago. The problem is controlled. Recent lipid tests were reviewed and are normal. He has no history of diabetes or liver disease. Pertinent negatives include no chest pain, leg pain or shortness of breath. Current antihyperlipidemic treatment includes statins. The current treatment provides moderate improvement of lipids. There are no compliance problems.  Risk factors for coronary artery disease include dyslipidemia, hypertension and male sex.  BPH Pt currently taking flomax. Pt states is currently working with no complaints.  Chronic Neck Pain Pt currently taking mobic for this with mild relief. Pt having trouble sleeping at night because of pain. Would like to have something to help him sleep.   *Pt spanish speaking with limited English interpreter present.   Review of Systems  Constitutional: Negative.   HENT: Negative.   Eyes: Negative for blurred vision.  Respiratory: Negative.  Negative for shortness of breath.   Cardiovascular: Negative for chest pain and palpitations.  Genitourinary: Negative.   Neurological: Negative for headaches.  All other systems reviewed and are negative.      Objective:   Physical  Exam  Vitals reviewed. Constitutional: He is oriented to person, place, and time. He appears well-developed and well-nourished. No distress.  HENT:  Head: Normocephalic.  Right Ear: External ear normal.  Left Ear: External ear normal.  Nose: Nose normal.  Mouth/Throat: Oropharynx is clear and moist.  Eyes: Pupils are equal, round, and reactive to light. Right eye exhibits no discharge. Left eye exhibits no discharge.  Neck: Normal range of motion. Neck supple. No thyromegaly present.  Cardiovascular: Normal rate, regular rhythm, normal heart sounds and intact distal pulses.   No murmur heard. Pulmonary/Chest: Effort normal. No respiratory distress. He has no wheezes.  Diminished breath sounds   Abdominal: Soft. Bowel sounds are normal. He exhibits no distension. There is no tenderness.  Musculoskeletal: Normal range of motion. He exhibits no edema and no tenderness.  Neurological: He is alert and oriented to person, place, and time. He has normal reflexes. No cranial nerve deficit.  Skin: Skin is warm and dry. No rash noted. No erythema.  Psychiatric: He has a normal mood and affect. His behavior is normal. Judgment and thought content normal.      BP 136/71  Pulse 55  Temp(Src) 97.6 F (36.4 C) (Oral)  Ht _0  (1.626 m)  Wt 133 lb (60.328 kg)  BMI 22.82 kg/m2     Assessment & Plan:  1. Essential hypertension, benign - BMP8+EGFR; Future  2. BPH (benign prostatic hyperplasia) - tamsulosin (FLOMAX) 0.4 MG CAPS capsule; Take 1 capsule (0.4 mg total) by mouth daily.  Dispense: 30 capsule; Refill: 11  3. Other and unspecified hyperlipidemia - Lipid panel; Future  4. Annual physical exam - PSA, total and free; Future - Vit D  25  hydroxy (rtn osteoporosis monitoring); Future   Continue all meds Labs pending Health Maintenance reviewed Diet and exercise encouraged RTO 6 months   Evelina Dun, FNP

## 2013-06-18 ENCOUNTER — Other Ambulatory Visit (INDEPENDENT_AMBULATORY_CARE_PROVIDER_SITE_OTHER): Payer: Medicare Other

## 2013-06-18 DIAGNOSIS — Z0000 Encounter for general adult medical examination without abnormal findings: Secondary | ICD-10-CM

## 2013-06-18 DIAGNOSIS — I10 Essential (primary) hypertension: Secondary | ICD-10-CM

## 2013-06-18 DIAGNOSIS — Z125 Encounter for screening for malignant neoplasm of prostate: Secondary | ICD-10-CM | POA: Diagnosis not present

## 2013-06-18 DIAGNOSIS — E785 Hyperlipidemia, unspecified: Secondary | ICD-10-CM

## 2013-06-18 DIAGNOSIS — E559 Vitamin D deficiency, unspecified: Secondary | ICD-10-CM | POA: Diagnosis not present

## 2013-06-18 NOTE — Progress Notes (Signed)
Pt came in for labs only 

## 2013-06-19 LAB — BMP8+EGFR
BUN / CREAT RATIO: 17 (ref 10–22)
BUN: 13 mg/dL (ref 8–27)
CHLORIDE: 101 mmol/L (ref 97–108)
CO2: 28 mmol/L (ref 18–29)
Calcium: 9.5 mg/dL (ref 8.6–10.2)
Creatinine, Ser: 0.76 mg/dL (ref 0.76–1.27)
GFR calc non Af Amer: 86 mL/min/{1.73_m2} (ref >59)
GFR, EST AFRICAN AMERICAN: 100 mL/min/{1.73_m2} (ref >59)
Glucose: 98 mg/dL (ref 65–99)
POTASSIUM: 3.9 mmol/L (ref 3.5–5.2)
SODIUM: 143 mmol/L (ref 134–144)

## 2013-06-19 LAB — VITAMIN D 25 HYDROXY (VIT D DEFICIENCY, FRACTURES): Vit D, 25-Hydroxy: 24.4 ng/mL — ABNORMAL LOW (ref 30.0–100.0)

## 2013-06-19 LAB — LIPID PANEL
CHOLESTEROL TOTAL: 107 mg/dL (ref 100–199)
Chol/HDL Ratio: 2.6 ratio units (ref 0.0–5.0)
HDL: 41 mg/dL (ref >39)
LDL CALC: 53 mg/dL (ref 0–99)
Triglycerides: 63 mg/dL (ref 0–149)
VLDL Cholesterol Cal: 13 mg/dL (ref 5–40)

## 2013-06-19 LAB — PSA, TOTAL AND FREE
PSA FREE: 0.66 ng/mL
PSA, Free Pct: 22 %
PSA: 3 ng/mL (ref 0.0–4.0)

## 2013-06-20 ENCOUNTER — Telehealth: Payer: Self-pay | Admitting: *Deleted

## 2013-06-20 NOTE — Telephone Encounter (Signed)
All labs look good except vitamin D is low. Start OTC vitamin D 3000u twice daily. Continue medication,dieting and exercising.

## 2013-06-26 ENCOUNTER — Telehealth: Payer: Self-pay | Admitting: *Deleted

## 2013-06-26 NOTE — Telephone Encounter (Signed)
Take OTC vitamin D 6000 units daily to increase numbers.  Follow low fat diet and exercise more.  Labs are normal otherwise.

## 2013-06-30 ENCOUNTER — Other Ambulatory Visit: Payer: Self-pay

## 2013-06-30 MED ORDER — LISINOPRIL 40 MG PO TABS: ORAL_TABLET | ORAL | Status: DC

## 2013-07-01 ENCOUNTER — Other Ambulatory Visit: Payer: Self-pay | Admitting: Family Medicine

## 2013-07-01 DIAGNOSIS — M2040 Other hammer toe(s) (acquired), unspecified foot: Secondary | ICD-10-CM | POA: Diagnosis not present

## 2013-07-27 ENCOUNTER — Other Ambulatory Visit: Payer: Self-pay | Admitting: Family Medicine

## 2013-09-11 ENCOUNTER — Encounter: Payer: Self-pay | Admitting: *Deleted

## 2013-11-23 ENCOUNTER — Other Ambulatory Visit: Payer: Self-pay | Admitting: Family Medicine

## 2013-11-24 NOTE — Telephone Encounter (Signed)
Last seen 06/17/13 [facility]

## 2013-12-22 ENCOUNTER — Ambulatory Visit (INDEPENDENT_AMBULATORY_CARE_PROVIDER_SITE_OTHER): Payer: Medicare Other | Admitting: Nurse Practitioner

## 2013-12-22 ENCOUNTER — Encounter: Payer: Self-pay | Admitting: Nurse Practitioner

## 2013-12-22 ENCOUNTER — Ambulatory Visit: Payer: Medicare Other | Admitting: Family

## 2013-12-22 VITALS — BP 130/88 | HR 56 | Temp 96.8°F | Ht 64.0 in | Wt 135.0 lb

## 2013-12-22 DIAGNOSIS — M542 Cervicalgia: Secondary | ICD-10-CM | POA: Diagnosis not present

## 2013-12-22 DIAGNOSIS — N400 Enlarged prostate without lower urinary tract symptoms: Secondary | ICD-10-CM | POA: Diagnosis not present

## 2013-12-22 DIAGNOSIS — I10 Essential (primary) hypertension: Secondary | ICD-10-CM

## 2013-12-22 DIAGNOSIS — E785 Hyperlipidemia, unspecified: Secondary | ICD-10-CM

## 2013-12-22 MED ORDER — LISINOPRIL 40 MG PO TABS: ORAL_TABLET | ORAL | Status: DC

## 2013-12-22 MED ORDER — ATORVASTATIN CALCIUM 40 MG PO TABS: 40.0000 mg | ORAL_TABLET | Freq: Every day | ORAL | Status: DC

## 2013-12-22 MED ORDER — HYDROCHLOROTHIAZIDE 25 MG PO TABS: ORAL_TABLET | ORAL | Status: DC

## 2013-12-22 NOTE — Patient Instructions (Signed)
Mantenimiento de Technical sales engineer (Health Maintenance) Un estilo de vida saludable y los cuidados preventivos pueden favorecer la salud y [place].  No deje de [employer] de rutina de la salud, dentales y de Public librarian.  Consuma una dieta saludable. Los [employer], frutas, cereales integrales, productos lcteos descremados y protenas magras contienen los nutrientes que usted necesita y no tienen muchas caloras. Disminuya la ingesta de alimentos ricos en grasas slidas, azcares y sal agregadas. Si es necesario, pdale informacin acerca de una dieta [place] a su mdico.  Realizar actividad fsica de forma regular es una de las prcticas ms importantes que puede hacer por su salud. Los adultos deben hacer al menos 150 minutos de ejercicios de intensidad moderada (cualquier actividad que aumente la frecuencia cardaca y lo haga transpirar) cada semana. Adems, la [employer] de los adultos necesita practicar ejercicios de fortalecimiento muscular dos o ms veces por semana.  Mantenga un peso saludable. El ndice de masa corporal [facility]) es una herramienta que identifica posibles problemas con [place]. Proporciona una estimacin de la grasa corporal basndose en el peso y la altura. El mdico podr determinar su [facility] y ayudarlo a Scientist, forensic o Theatre manager un peso saludable. Para los adultos mayores de 20aos:  Un [facility] menor de 18,5 se considera bajo peso.  Un [facility] entre 18,5 y 24,9 es normal.  Un [facility] entre 25 y 29,9 se considera sobrepeso.  Un IMC de 30 o ms se considera obesidad.  Mantenga un nivel normal de lpidos y colesterol en la sangre practicando actividad fsica y minimizando la ingesta de grasas saturadas. Consuma una dieta balanceada e incluya variedad de frutas y vegetales. A partir de los 20 aos se deben realizar anlisis de sangre a fin de Freight forwarder nivel de lpidos y colesterol en la sangre y [place] cada 5 aos. Si los niveles de colesterol son altos, tiene ms de 50  aos o tiene riesgo elevado de sufrir enfermedades cardacas, Designer, industrial/product controlarse con ms frecuencia. Si tiene [employer] de lpidos y colesterol, debe recibir tratamiento con medicamentos, si la dieta y el ejercicio no estn funcionando.  Si fuma, consulte con el mdico acerca de las opciones para dejar de [place]. Si no fuma, no comience.  Se recomienda realizar exmenes de deteccin de cncer de pulmn a personas adultas entre 92 y 12 aos que estn en riesgo de Horticulturist, commercial de pulmn por sus antecedentes de consumo de tabaco. Para quienes hayan fumado durante 30 aos un paquete diario, y sigan fumando o hayan dejado el hbito en algn momento en los ltimos 15 aos, se recomienda realizarse una tomografa computada de baja dosis de los pulmones todos los [place]. Fumar un paquete por ao equivale a fumar un promedio de un paquete de cigarrillos diario durante un ao (por ejemplo, fumar 30paquetes por ao podra significar fumar un paquete de cigarrillos diario durante 30aos o 2paquetes diarios durante 15aos). Los exmenes anuales deben continuar hasta que el fumador haya dejado de fumar durante un mnimo de 15 aos. Ya no deben Emergency planning/management officer que tengan un problema de salud que les impida recibir tratamiento para el cncer de pulmn.  Si decide tomar alcohol, no beba ms de [employer]. Se considera una medida 12onzas (361m) de cerveza, 5onzas (1528m de vino o 1,[id number] licor.  Evite el consumo de drogas. No comparta agujas. Pida ayuda si necesita asistencia o instrucciones con respecto a abandonar el consumo de drogas.  La hipertensin arterial causa  enfermedades cardacas y [place] el riesgo de ictus. Debe controlar su presin arterial al menos cada uno o [place]. La presin arterial elevada que persiste debe tratarse con medicamentos si la prdida de peso y el ejercicio no son efectivos.  Si tiene entre 70 y 37 aos, consulte a su mdico si debe  tomar aspirina para prevenir enfermedades cardacas.  Los anlisis para la diabetes incluyen la toma de [place] de sangre para controlar el nivel de azcar en la sangre durante el [place]. Debe hacerlos cada 3aos despus de los 12aos si su peso es normal y no tiene factores de riesgo de diabetes. Las pruebas deben comenzar a edades tempranas o llevarse a cabo con ms frecuencia si tiene sobrepeso y al menos un factor de riesgo para la diabetes.  El cncer colorrectal puede detectarse y con frecuencia puede prevenirse. La mayor parte de los estudios de rutina se deben Medical laboratory scientific officer a Field seismologist a Proofreader de los 41 aos y [place] 55 aos. Sin embargo, el mdico podr aconsejarle que lo haga antes, si tiene factores de riesgo para el cncer de colon. Una vez por ao, el mdico le dar un kit de prueba para Hydrologist en la materia fecal. Es posible que se use una pequea cmara en el extremo de un tubo para examinar directamente el colon (sigmoidoscopa o colonoscopa) para Hydrographic surveyor formas tempranas de cncer colorrectal. Hable sobre esto con su mdico si tiene 38 aos, edad a la que [employer] estudios de [place]. El examen directo del colon debe repetirse cada cinco a 10 aos, hasta los 75 aos, excepto que se encuentren formas tempranas de plipos precancerosos o pequeos bultos.  Las personas con un riesgo mayor de Insurance risk surveyor hepatitis B deben realizarse anlisis para Futures trader virus. Se considera que tiene un alto riesgo de Museum/gallery curator hepatitis B si:  Naci en un pas donde la hepatitis B es frecuente. Pregntele a su mdico qu pases son considerados de Public affairs consultant.  Sus padres nacieron en un pas de alto riesgo y usted no recibi una vacuna que lo proteja contra la hepatitis B (vacuna contra la hepatitis B).  [place].  [place] agujas para inyectarse drogas.  Vive o tiene sexo con alguien que tiene hepatitis B.  Es un hombre que tiene sexo con otros hombres.  Recibe tratamiento de  hemodilisis.  Toma ciertos medicamentos para Nurse, mental health, trasplante de rganos y afecciones autoinmunes.  Se recomienda realizar un anlisis de sangre para Hydrographic surveyor hepatitis C a todas las personas nacidas entre 1945 y 1965, y a toda persona que tenga un riesgo de haber contrado esta enfermedad.  Los hombres sanos no deben hacerse anlisis de sangre para Hydrographic surveyor antgenos especficos prostticos (PSA) como parte de los estudios de rutina para Science writer. Pregntele a su mdico sobre las pruebas de deteccin de cncer de prstata.  La evaluacin del cncer de testculos no se recomienda en hombres adolescentes ni adultos que no tengan sntomas. La evaluacin incluye el autoexamen, el examen por parte del mdico y otras pruebas diagnsticas. Consulte con su mdico si tiene algn sntoma o preocupaciones acerca del cncer de testculos.  Practique el sexo seguro. Use condones y evite las prcticas sexuales riesgosas para disminuir el contagio de enfermedades de transmisin sexual (ETS).  Debe realizarse pruebas de deteccin de ETS, incluidas la gonorrea y la clamidia si:  Es sexualmente activa y es menor de [place].  Es mayor de 45aos y Scientific laboratory technician que est  en riesgo de padecer esta infeccin.  La actividad sexual ha cambiado desde que le hicieron la ltima prueba de deteccin y tiene un riesgo mayor de Best boy clamidia o Radio broadcast assistant. Pregntele al mdico si usted tiene riesgo.  Si tiene riesgo de infectarse por el VIH, se recomienda tomar diariamente un medicamento recetado para evitar la infeccin. Esto se conoce como profilaxis previa a la exposicin. Se considera que est en riesgo si:  Es un hombre que tiene sexo con otros hombres.  Es heterosexual y es activo sexualmente con mltiples parejas.  Se inyecta drogas.  Es sexualmente activo con una pareja que tiene VIH.  Consulte a su mdico para saber si tiene un alto riesgo de infectarse por el VIH. Si opta por  comenzar la profilaxis previa a la exposicin, primero debe realizarse anlisis de deteccin del VIH. Luego, le harn anlisis cada 83mses mientras est tomando los medicamentos para la profilaxis previa a la exposicin.  Utilice pantalla solar. Aplique pantalla solar de mKerry Doryy repetida a lo largo del dTraining and development officer Resgurdese del sol cuando la sombra sea ms pequea que usted. Protjase usando mangas y p[employer] un sombrero de ala ancha y gafas para el sol todo el ao, siempre que se encuentre en el exterior.  Informe a su mdico si aparecen nuevos lunares o los que tiene se modifican, especialmente en forma y color. Tambin notifique al mdico si un lunar es ms grande que el tamao de una goma de lGames developer  Si tiene entre 680y 758aos y es o ha sido fumador, se recomienda un estudio con ecografa para dEnvironmental managerde aorta abdominal (AAA) y su eventual reparacin q[place]  [place](inmunizaciones). Document Released: 07/15/2007 Document Revised: 01/21/2013 EGateway Surgery CenterPatient Information 2015 [place] This information is not intended to replace advice given to you by your health care provider. Make sure you discuss any questions you have with your health care provider.

## 2013-12-22 NOTE — Progress Notes (Signed)
Subjective:    Patient ID: Darren Rose, male    DOB: [birth year], 80 y.o.   MRN: [id number]  Hypertension This is a chronic problem. The current episode started more than 1 year ago. The problem is unchanged. The problem is controlled. Pertinent negatives include no chest pain, headaches, palpitations or shortness of breath. Risk factors for coronary artery disease include dyslipidemia, family history and male gender. Past treatments include ACE inhibitors and diuretics. The current treatment provides moderate improvement. Compliance problems include diet and exercise.   Hyperlipidemia This is a chronic problem. The current episode started more than 1 year ago. The problem is uncontrolled. Recent lipid tests were reviewed and are high. He has no history of diabetes, hypothyroidism or obesity. Pertinent negatives include no chest pain or shortness of breath. Current antihyperlipidemic treatment includes statins. The current treatment provides moderate improvement of lipids. Compliance problems include adherence to diet and adherence to exercise.  Risk factors for coronary artery disease include dyslipidemia, family history and hypertension.  BPH Pt currently taking flomax. Pt states is currently working with no complaints.  Chronic Neck Pain Pt currently taking mobic for this with mild relief. Pt having trouble sleeping at night because of pain. Would like to have something to help him sleep.   *Pt spanish speaking with limited English interpreter present.   Review of Systems  Constitutional: Negative.   HENT: Negative.   Respiratory: Negative.  Negative for shortness of breath.   Cardiovascular: Negative for chest pain and palpitations.  Genitourinary: Negative.   Neurological: Negative for headaches.  All other systems reviewed and are negative.      Objective:   Physical Exam  Constitutional: He is oriented to person, place, and time. He appears well-developed and well-nourished.   HENT:  Head: Normocephalic.  Right Ear: External ear normal.  Left Ear: External ear normal.  Nose: Nose normal.  Mouth/Throat: Oropharynx is clear and moist.  Eyes: EOM are normal. Pupils are equal, round, and reactive to light.  Neck: Normal range of motion. Neck supple. No JVD present. No thyromegaly present.  Cardiovascular: Normal rate, regular rhythm, normal heart sounds and intact distal pulses.  Exam reveals no gallop and no friction rub.   No murmur heard. Pulmonary/Chest: Effort normal and breath sounds normal. No respiratory distress. He has no wheezes. He has no rales. He exhibits no tenderness.  Abdominal: Soft. Bowel sounds are normal. He exhibits no mass. There is no tenderness.  Musculoskeletal: Normal range of motion. He exhibits no edema.  Lymphadenopathy:    He has no cervical adenopathy.  Neurological: He is alert and oriented to person, place, and time. No cranial nerve deficit.  Skin: Skin is warm and dry.  Psychiatric: He has a normal mood and affect. His behavior is normal. Judgment and thought content normal.      BP 144/77 mmHg  Pulse 56  Temp(Src) 96.8 F (36 C) (Oral)  Ht 5' 4"  (1.626 m)  Wt 135 lb (61.236 kg)  BMI 23.16 kg/m2     Assessment & Plan:  1. Essential hypertension, benign DO not add salt to diet - hydrochlorothiazide (HYDRODIURIL) 25 MG tablet; TAKE 1 TABLET (25 MG TOTAL) BY MOUTH DAILY.  Dispense: 30 tablet; Refill: 5 - lisinopril (PRINIVIL,ZESTRIL) 40 MG tablet; TAKE 1 TABLET (40 MG TOTAL) BY MOUTH DAILY.  Dispense: 30 tablet; Refill: 5 - CMP14+EGFR  2. BPH (benign prostatic hyperplasia) Keep appoontment with urologist  3. Hyperlipidemia with target LDL less than 100 Low fat diet -  atorvastatin (LIPITOR) 40 MG tablet; Take 1 tablet (40 mg total) by mouth daily at 6 PM.  Dispense: 30 tablet; Refill: 6 - NMR, lipoprofile  4. Cervicalgia Rest Moist heat as needed   Flu shot today Labs pending Health maintenance  reviewed Diet and exercise encouraged Continue all meds Follow up  In 3 month   [place], FNP

## 2013-12-23 LAB — CMP14+EGFR
ALBUMIN: 4.2 g/dL (ref 3.5–4.7)
ALT: 21 IU/L (ref 0–44)
AST: 27 IU/L (ref 0–40)
Albumin/Globulin Ratio: 1.9 (ref 1.1–2.5)
Alkaline Phosphatase: 74 IU/L (ref 39–117)
BUN/Creatinine Ratio: 28 — ABNORMAL HIGH (ref 10–22)
BUN: 22 mg/dL (ref 8–27)
CALCIUM: 9.6 mg/dL (ref 8.6–10.2)
CO2: 26 mmol/L (ref 18–29)
CREATININE: 0.78 mg/dL (ref 0.76–1.27)
Chloride: 99 mmol/L (ref 97–108)
GFR calc Af Amer: 99 mL/min/{1.73_m2} (ref >59)
GFR calc non Af Amer: 85 mL/min/{1.73_m2} (ref >59)
GLOBULIN, TOTAL: 2.2 g/dL (ref 1.5–4.5)
Glucose: 87 mg/dL (ref 65–99)
Potassium: 4 mmol/L (ref 3.5–5.2)
Sodium: 140 mmol/L (ref 134–144)
TOTAL PROTEIN: 6.4 g/dL (ref 6.0–8.5)
Total Bilirubin: 0.8 mg/dL (ref 0.0–1.2)

## 2013-12-23 LAB — NMR, LIPOPROFILE
CHOLESTEROL: 120 mg/dL (ref 100–199)
HDL CHOLESTEROL BY NMR: 49 mg/dL (ref >39)
HDL PARTICLE NUMBER: 32.9 umol/L (ref >=30.5)
LDL Particle Number: 567 nmol/L (ref <1000)
LDL Size: 20.8 nm (ref >20.5)
LDL-C: 59 mg/dL (ref 0–99)
LP-IR Score: 32 (ref <=45)
Small LDL Particle Number: 194 nmol/L (ref <=527)
TRIGLYCERIDES BY NMR: 59 mg/dL (ref 0–149)

## 2013-12-24 ENCOUNTER — Telehealth: Payer: Self-pay | Admitting: Nurse Practitioner

## 2013-12-24 NOTE — Telephone Encounter (Signed)
-----   Message from [facility], [place] sent at 12/23/2013  6:48 PM EST ----- Kidney and liver function stable Cholesterol looks great Continue current meds- low fat diet and exercise and recheck in 3 months

## 2013-12-30 ENCOUNTER — Other Ambulatory Visit: Payer: Self-pay | Admitting: Nurse Practitioner

## 2013-12-30 ENCOUNTER — Other Ambulatory Visit: Payer: Self-pay | Admitting: Family Medicine

## 2013-12-30 NOTE — Telephone Encounter (Signed)
Patient aware.

## 2014-02-09 DIAGNOSIS — L309 Dermatitis, unspecified: Secondary | ICD-10-CM | POA: Diagnosis not present

## 2014-03-24 ENCOUNTER — Telehealth: Payer: Self-pay | Admitting: Nurse Practitioner

## 2014-03-24 NOTE — Telephone Encounter (Signed)
Pt given appt with MMM 2/26 at 12:30.

## 2014-03-27 ENCOUNTER — Ambulatory Visit: Payer: Medicare Other | Admitting: Nurse Practitioner

## 2014-03-30 ENCOUNTER — Encounter: Payer: Self-pay | Admitting: Nurse Practitioner

## 2014-03-30 ENCOUNTER — Ambulatory Visit (INDEPENDENT_AMBULATORY_CARE_PROVIDER_SITE_OTHER): Payer: Medicare Other | Admitting: Nurse Practitioner

## 2014-03-30 VITALS — BP 139/66 | HR 52 | Temp 97.1°F | Ht 64.0 in | Wt 135.0 lb

## 2014-03-30 DIAGNOSIS — L0100 Impetigo, unspecified: Secondary | ICD-10-CM | POA: Diagnosis not present

## 2014-03-30 DIAGNOSIS — N400 Enlarged prostate without lower urinary tract symptoms: Secondary | ICD-10-CM

## 2014-03-30 DIAGNOSIS — M542 Cervicalgia: Secondary | ICD-10-CM | POA: Diagnosis not present

## 2014-03-30 MED ORDER — MUPIROCIN 2 % EX OINT: TOPICAL_OINTMENT | CUTANEOUS | Status: DC

## 2014-03-30 MED ORDER — TAMSULOSIN HCL 0.4 MG PO CAPS: 0.4000 mg | ORAL_CAPSULE | Freq: Every day | ORAL | Status: DC

## 2014-03-30 MED ORDER — MELOXICAM 7.5 MG PO TABS: ORAL_TABLET | ORAL | Status: DC

## 2014-03-30 NOTE — Progress Notes (Signed)
   Subjective:    Patient ID: Darren Rose, male    DOB: Nov 02, 1931, 81 y.o.   MRN: [id number]  HPI Patient is here today for skin rash on left arm that started a month ago (interpreter present). Patient report the rash is painful and pruritic. He has  applied a cream which did not help. He reports drainage sometimes.     Review of Systems  Constitutional: Negative.   HENT: Negative.   Eyes: Negative.   Respiratory: Negative.   Cardiovascular: Negative.   Gastrointestinal: Negative.   Endocrine: Negative.   Genitourinary: Negative.   Musculoskeletal: Negative.   Skin: Positive for rash (left arm. ).  Allergic/Immunologic: Negative.   Neurological: Negative.   Hematological: Negative.   Psychiatric/Behavioral: Negative.  Negative for suicidal ideas.       Objective:   Physical Exam  Constitutional: He is oriented to person, place, and time. He appears well-developed and well-nourished.  HENT:  Head: Normocephalic.  Eyes: Conjunctivae are normal. Pupils are equal, round, and reactive to light.  Neck: Normal range of motion.  Cardiovascular: Normal rate.   Pulmonary/Chest: Effort normal.  Abdominal: Soft.  Musculoskeletal: Normal range of motion.  Neurological: He is alert and oriented to person, place, and time.  Skin: Skin is warm.  Psychiatric: He has a normal mood and affect.   BP 139/66 mmHg  Pulse 52  Temp(Src) 97.1 F (36.2 C) (Oral)  Ht 5\' 4"  (1.626 m)  Wt 135 lb (61.236 kg)  BMI 23.16 kg/m2        Assessment & Plan:  1. Impetigo Keep clean Do not pick or scratch Good hand washing after applying medication - mupirocin ointment (BACTROBAN) 2 %; Apply to affected area   BID x5 days  Dispense: 22 g; Refill: 0  2. BPH (benign prostatic hyperplasia) - tamsulosin (FLOMAX) 0.4 MG CAPS capsule; Take 1 capsule (0.4 mg total) by mouth daily.  Dispense: 30 capsule; Refill: 11  3. Neck pain - meloxicam (MOBIC) 7.5 MG tablet; TAKE 1 TABLET (7.5 MG TOTAL)  BY MOUTH DAILY.  Dispense: 30 tablet; Refill: 2   RTO PRN Mary-Margaret Hassell Done, FNP

## 2014-04-06 ENCOUNTER — Telehealth: Payer: Self-pay | Admitting: Nurse Practitioner

## 2014-04-06 NOTE — Telephone Encounter (Signed)
Appointment given for tomorrow at 2 with Ronnald Collum, FNP

## 2014-04-07 ENCOUNTER — Ambulatory Visit (INDEPENDENT_AMBULATORY_CARE_PROVIDER_SITE_OTHER): Payer: Medicare Other | Admitting: Nurse Practitioner

## 2014-04-07 ENCOUNTER — Encounter: Payer: Self-pay | Admitting: Nurse Practitioner

## 2014-04-07 VITALS — BP 134/64 | HR 64 | Temp 97.4°F | Ht 64.0 in | Wt 135.0 lb

## 2014-04-07 DIAGNOSIS — L0100 Impetigo, unspecified: Secondary | ICD-10-CM | POA: Diagnosis not present

## 2014-04-07 MED ORDER — SULFAMETHOXAZOLE-TRIMETHOPRIM 800-160 MG PO TABS: 1.0000 | ORAL_TABLET | Freq: Two times a day (BID) | ORAL | Status: DC

## 2014-04-07 NOTE — Progress Notes (Signed)
° °  Subjective:    Patient ID: Darren Rose, male    DOB: [birth year], 81 y.o.   MRN: [id number]  HPI Patient is here today following up on a rash on his left arm. He was seen on 2/29 and was given bactroban. He reports the rash is not healing. He denies any pain but reports intermittent itching.    Review of Systems  Constitutional: Negative.   HENT: Negative.   Eyes: Negative.   Respiratory: Negative.   Cardiovascular: Negative.   Gastrointestinal: Negative.   Endocrine: Negative.   Genitourinary: Negative.   Musculoskeletal: Negative.   Skin: Negative.        Left arm rash.   Allergic/Immunologic: Negative.   Neurological: Negative.   Hematological: Negative.   Psychiatric/Behavioral: Negative.        Objective:   Physical Exam  Constitutional: He is oriented to person, place, and time. He appears well-developed.  HENT:  Head: Normocephalic.  Eyes: Pupils are equal, round, and reactive to light.  Neck: Normal range of motion.  Cardiovascular: Normal rate.   Pulmonary/Chest: Effort normal.  Abdominal: Soft.  Musculoskeletal: Normal range of motion.  Neurological: He is alert and oriented to person, place, and time.  Skin: Rash (left arm. ) noted. There is erythema (left arm. ).  Rash is healing. Small maculapapular lesions noted in annular shape with central scabbing. No drainage.     BP 134/64 mmHg   Pulse 64   Temp(Src) 97.4 F (36.3 C) (Oral)   Ht 5\' 4"  (1.626 m)   Wt 135 lb (61.236 kg)   BMI 23.16 kg/m2       Assessment & Plan:   1. Impetigo    Meds ordered this encounter  Medications   sulfamethoxazole-trimethoprim (BACTRIM DS) 800-160 MG per tablet    Sig: Take 1 tablet by mouth 2 (two) times daily.    Dispense:  14 tablet    Refill:  0    Order Specific Question:  Supervising Provider    Answer:  Joycelyn Man   Do not scratch or rub RTO prn  Mary-Margaret Hassell Done, FNP

## 2014-04-25 ENCOUNTER — Other Ambulatory Visit: Payer: Self-pay | Admitting: Nurse Practitioner

## 2014-07-02 ENCOUNTER — Other Ambulatory Visit: Payer: Self-pay | Admitting: Nurse Practitioner

## 2014-07-13 ENCOUNTER — Other Ambulatory Visit: Payer: Self-pay | Admitting: Nurse Practitioner

## 2014-08-11 ENCOUNTER — Other Ambulatory Visit: Payer: Self-pay | Admitting: Nurse Practitioner

## 2014-08-11 NOTE — Telephone Encounter (Signed)
Last seen 04/07/14 MMM  Last lipid 12/22/13

## 2014-08-11 NOTE — Telephone Encounter (Signed)
no more refills without being seen  

## 2014-09-10 ENCOUNTER — Other Ambulatory Visit: Payer: Self-pay | Admitting: Nurse Practitioner

## 2014-09-30 ENCOUNTER — Other Ambulatory Visit: Payer: Self-pay | Admitting: Nurse Practitioner

## 2014-10-01 NOTE — Telephone Encounter (Signed)
no more refills without being seen  

## 2014-10-01 NOTE — Telephone Encounter (Signed)
Last seen 04/07/14 MMM

## 2014-10-10 ENCOUNTER — Other Ambulatory Visit: Payer: Self-pay | Admitting: Nurse Practitioner

## 2014-10-12 NOTE — Telephone Encounter (Signed)
Only filled blood pressure meds Patient NTBS for follow up and lab work

## 2014-10-12 NOTE — Telephone Encounter (Signed)
Detailed message left for patient that he will need to be seen and that we only filled that BP medication.

## 2014-10-12 NOTE — Telephone Encounter (Signed)
Last labs 11/2013

## 2014-10-16 ENCOUNTER — Ambulatory Visit (INDEPENDENT_AMBULATORY_CARE_PROVIDER_SITE_OTHER): Payer: Medicare Other | Admitting: Family Medicine

## 2014-10-16 ENCOUNTER — Encounter: Payer: Self-pay | Admitting: Family Medicine

## 2014-10-16 ENCOUNTER — Ambulatory Visit (INDEPENDENT_AMBULATORY_CARE_PROVIDER_SITE_OTHER): Payer: Medicare Other

## 2014-10-16 VITALS — BP 138/77 | HR 52 | Temp 97.4°F | Ht 64.0 in | Wt 134.0 lb

## 2014-10-16 DIAGNOSIS — E785 Hyperlipidemia, unspecified: Secondary | ICD-10-CM

## 2014-10-16 DIAGNOSIS — N400 Enlarged prostate without lower urinary tract symptoms: Secondary | ICD-10-CM

## 2014-10-16 DIAGNOSIS — G8929 Other chronic pain: Secondary | ICD-10-CM | POA: Diagnosis not present

## 2014-10-16 DIAGNOSIS — I10 Essential (primary) hypertension: Secondary | ICD-10-CM

## 2014-10-16 DIAGNOSIS — R361 Hematospermia: Secondary | ICD-10-CM

## 2014-10-16 DIAGNOSIS — Z0000 Encounter for general adult medical examination without abnormal findings: Secondary | ICD-10-CM

## 2014-10-16 DIAGNOSIS — K5901 Slow transit constipation: Secondary | ICD-10-CM

## 2014-10-16 DIAGNOSIS — M542 Cervicalgia: Secondary | ICD-10-CM

## 2014-10-16 MED ORDER — LISINOPRIL 40 MG PO TABS: ORAL_TABLET | ORAL | Status: DC

## 2014-10-16 MED ORDER — MELOXICAM 7.5 MG PO TABS: ORAL_TABLET | ORAL | Status: DC

## 2014-10-16 MED ORDER — ATORVASTATIN CALCIUM 40 MG PO TABS: ORAL_TABLET | ORAL | Status: DC

## 2014-10-16 MED ORDER — DOCUSATE SODIUM 100 MG PO CAPS: 100.0000 mg | ORAL_CAPSULE | Freq: Two times a day (BID) | ORAL | Status: DC

## 2014-10-16 MED ORDER — HYDROCHLOROTHIAZIDE 25 MG PO TABS: ORAL_TABLET | ORAL | Status: DC

## 2014-10-16 MED ORDER — TAMSULOSIN HCL 0.4 MG PO CAPS: 0.4000 mg | ORAL_CAPSULE | Freq: Every day | ORAL | Status: DC

## 2014-10-16 NOTE — Progress Notes (Signed)
Subjective:   Darren Rose is a 81 y.o. male who presents for Medicare Annual/Subsequent preventive examination.  Review of Systems:  Review of Systems  Constitutional: Negative for fever and chills.  HENT: Positive for hearing loss. Negative for congestion, ear discharge and ear pain.   Eyes: Negative for double vision.  Respiratory: Negative for cough, hemoptysis, shortness of breath and wheezing.   Cardiovascular: Negative for chest pain, palpitations and leg swelling.  Gastrointestinal: Positive for constipation. Negative for heartburn, nausea, vomiting, abdominal pain, diarrhea, blood in stool and melena.  Genitourinary: Negative for dysuria and flank pain.  Musculoskeletal: Positive for neck pain.  Skin: Negative for rash.  Neurological: Negative for dizziness, tremors, weakness and headaches.  Psychiatric/Behavioral: Negative for depression and suicidal ideas. The patient is not nervous/anxious.     Cardiac Risk Factors include: advanced age (>78mn, >>66women);hypertension;male gender     Objective:    Vitals: BP 138/77 mmHg  Pulse 52  Temp(Src) 97.4 F (36.3 C) (Oral)  Ht 5' 4"  (1.626 m)  Wt 134 lb (60.782 kg)  BMI 22.99 kg/m2  Tobacco History  Smoking status  . Never Smoker   Smokeless tobacco  . Not on file     Counseling given: Not Answered   Past Medical History  Diagnosis Date  . Hypertension   . Hyperlipidemia   . BPH (benign prostatic hypertrophy)    Past Surgical History  Procedure Laterality Date  . Finger surgery     History reviewed. No pertinent family history. History  Sexual Activity  . Sexual Activity: Not on file    Outpatient Encounter Prescriptions as of 10/16/2014  Medication Sig  . atorvastatin (LIPITOR) 40 MG tablet TAKE 1 TABLET (40 MG TOTAL) BY MOUTH DAILY AT 6 PM.  . hydrochlorothiazide (HYDRODIURIL) 25 MG tablet TAKE 1 TABLET (25 MG TOTAL) BY MOUTH DAILY.  .Marland Kitchenlisinopril (PRINIVIL,ZESTRIL) 40 MG tablet TAKE 1 TABLET  (40 MG TOTAL) BY MOUTH DAILY.  . meloxicam (MOBIC) 7.5 MG tablet TOME UNA TABLETA TODOS LOS DIAS  . tamsulosin (FLOMAX) 0.4 MG CAPS capsule Take 1 capsule (0.4 mg total) by mouth daily.  . [DISCONTINUED] CLOBETASOL PROPIONATE E 0.05 % emollient cream APLIQUE DOS VECES AL DIA  . [DISCONTINUED] mupirocin ointment (BACTROBAN) 2 % Apply to affected area   BID x5 days  . [DISCONTINUED] sulfamethoxazole-trimethoprim (BACTRIM DS) 800-160 MG per tablet Take 1 tablet by mouth 2 (two) times daily.   No facility-administered encounter medications on file as of 10/16/2014.    Activities of Daily Living In your present state of health, do you have any difficulty performing the following activities: 10/16/2014 10/16/2014  Hearing? Y N  Vision? N N  Difficulty concentrating or making decisions? - -  Walking or climbing stairs? - N  Dressing or bathing? - N  Doing errands, shopping? - N  PConservation officer, natureand eating ? - N  Using the Toilet? - N  In the past six months, have you accidently leaked urine? - N  Do you have problems with loss of bowel control? - N  Managing your Medications? - N  Managing your Finances? - N  Housekeeping or managing your Housekeeping? - N    Patient Care Team: MChevis Pretty FNP as PCP - General (Nurse Practitioner)   Assessment:    Problem List Items Addressed This Visit      Cardiovascular and Mediastinum   Essential hypertension, benign - Primary   Relevant Orders   Basic metabolic panel  Other   Hyperlipidemia with target LDL less than 100   Relevant Orders   Lipid panel    Other Visit Diagnoses    Medicare annual wellness visit, subsequent        Routine history and physical examination of adult           Exercise Activities and Dietary recommendations Current Exercise Habits:: Home exercise routine, Type of exercise: walking, Time (Minutes): 20, Frequency (Times/Week): 5, Weekly Exercise (Minutes/Week): 100  Goals    None     Fall  Risk Fall Risk  10/16/2014 03/30/2014 12/22/2013 01/21/2013  Falls in the past year? No No No No   Depression Screen PHQ 2/9 Scores 10/16/2014 03/30/2014 12/22/2013  PHQ - 2 Score 0 0 0    Cognitive Testing MMSE - Mini Mental State Exam 10/16/2014  Orientation to time 5  Orientation to Place 5  Registration 3  Attention/ Calculation 5  Recall 2  Language- name 2 objects 2  Language- repeat 1  Language- follow 3 step command 3  Language- read & follow direction 1  Write a sentence 0  Copy design 1  Total score 28    Immunization History  Administered Date(s) Administered  . Pneumococcal Polysaccharide-23 01/21/2013  . Tdap 06/17/2013  . Zoster 12/20/2012   Screening Tests Health Maintenance  Topic Date Due  . PNA vac Low Risk Adult (2 of 2 - PCV13) 01/21/2014  . INFLUENZA VACCINE  11/15/2014 (Originally 08/31/2014)  . COLONOSCOPY  12/19/2022  . TETANUS/TDAP  06/18/2023  . ZOSTAVAX  Completed      Plan:      Problem List Items Addressed This Visit      Cardiovascular and Mediastinum   Essential hypertension, benign - Primary   Relevant Medications   atorvastatin (LIPITOR) 40 MG tablet   hydrochlorothiazide (HYDRODIURIL) 25 MG tablet   lisinopril (PRINIVIL,ZESTRIL) 40 MG tablet   Other Relevant Orders   Basic metabolic panel   KVQ25+ZDGL     Genitourinary   BPH (benign prostatic hyperplasia)   Relevant Medications   tamsulosin (FLOMAX) 0.4 MG CAPS capsule   Other Relevant Orders   PSA, total and free     Other   Hyperlipidemia with target LDL less than 100   Relevant Medications   atorvastatin (LIPITOR) 40 MG tablet   hydrochlorothiazide (HYDRODIURIL) 25 MG tablet   lisinopril (PRINIVIL,ZESTRIL) 40 MG tablet   Other Relevant Orders   Lipid panel   Lipid panel    Other Visit Diagnoses    Medicare annual wellness visit, subsequent        Routine history and physical examination of adult        Blood in semen        Relevant Orders    Ambulatory  referral to Urology    Slow transit constipation        Relevant Medications    docusate sodium (COLACE) 100 MG capsule    Neck pain of over 3 months duration        Relevant Medications    meloxicam (MOBIC) 7.5 MG tablet    Other Relevant Orders    DG Cervical Spine Complete      During the course of the visit the patient was educated and counseled about the following appropriate screening and preventive services:   Vaccines to include Pneumoccal, Influenza, Hepatitis B, Td, Zostavax, HCV  Electrocardiogram  Cardiovascular Disease  Colorectal cancer screening  Diabetes screening  Prostate Cancer Screening  Glaucoma screening  Nutrition counseling  Smoking cessation counseling  Patient does not want colonoscopy. Influenza vaccine will be back next month and patient will come back for that.  Patient Instructions (the written plan) was given to the patient.    Worthy Rancher, MD  10/16/2014

## 2014-10-16 NOTE — Patient Instructions (Signed)
Hiperplasia prosttica benigna  (Benign Prostatic Hypertrophy) La glndula prosttica forma parte del sistema reproductor masculino. Una prstata normal tiene aproximadamente el tamao de [place]. Produce un lquido que se mezcla con el esperma para formar el semen. Esta glndula rodea la uretra y se [place] en frente del recto y por debajo de la vejiga. La vejiga es TEFL teacher en el que se acumula la [place]. La uretra es el conducto que lleva la orina desde la vejiga hacia el exterior del cuerpo. La prstata crece a medida que el hombre envejece. Cuando la prstata se agranda por otras causas que no sean cncer, producen un trastorno llamado hipertrofia prosttica benigna (HPB). Una prstata agrandada puede presionar la uretra. Esto puede dificultar el pasaje de la orina. En las primeras etapas del agrandamiento, la vejiga puede adaptarse al fortalecer los msculos para impulsar la orina en la uretra ms estrecha. Si el problema contina o Boykin Reaper, requerir tratamiento mdico o quirrgico.  Esta afeccin debe ser controlada por su mdico. La acumulacin de orina en la vejiga puede causar una infeccin. La presin y la infeccin pueden llevar a un dao en la vejiga y a una insuficiencia en los riones (renal). Si es necesario, el mdico podr derivarlo a Teaching laboratory technician en enfermedades del rin y de la prstata (urlogo). CAUSAS  La hiperplasia prosttica benigna es un problema en hombres mayores de 22 aos. Esta afeccin es una parte normal del envejecimiento. Sin embargo, no todos los Energy manager por esta afeccin. Si el agrandamiento va ms all de la uretra, no habr compresin de la uretra ni resistencia al flujo de la orina. Si el agrandamiento es [employer] uretra y la comprime, experimentar dificultad para Garment/textile technologist.  SNTOMAS   Dificultad para vaciar completamente la vejiga.  Levantarse con frecuencia durante la noche para orinar.  Necesidad de Garment/textile technologist con ms frecuencia Musician.  Dificultad para comenzar a eliminar la orina.  Disminucin del tamao y la fuerza del chorro de [place].  Goteo al terminar de Garment/textile technologist.  Dolor al orinar (ms frecuente en caso de infeccin).  Imposibilidad para orinar. En este caso es necesario un tratamiento inmediato.  Desarrollo de una infeccin en el tracto urinario. DIAGNSTICO  Estos estudios ayudarn al mdico a diagnosticar el problema.  Una exhaustiva historia clnica y examen fsico.  Historia de su forma de orinar, con el nmero de veces y la cantidad que orina, la fuerza del chorro de [place] y la sensacin de haber vaciado o no la vejiga despus de Garment/textile technologist.  Un estudio despus de vaciar la vejiga que mide la cantidad de orina que queda en la vejiga despus de terminar de Garment/textile technologist.  Examen rectal digital. En el examen rectal digital, el mdico controla la prstata colocando un dedo enguantado y lubricado en el recto para sentir la parte posterior de la prstata. En este estudio se detecta el tamao de la glndula y si hay bultos o crecimientos.  Anlisis de orina (urinlisis).  Estudio de antgeno prosttico [place] es un anlisis de sangre que se utiliza para Electrical engineer de prstata.  Ecografa rectal. En este estudio se utilizan ondas de sonido para producir de Publishing copy una imagen de la glndula prosttica. TRATAMIENTO  Una vez que los sntomas comienzan, el mdico controlar la afeccin. De los hombres que sufren esta afeccin, un tercio tendr sntomas que se estabilizan, un tercio tendr sntomas que mejoran y un tercio tendr sntomas que Set designer. Los sntomas  leves pueden no necesitar tratamiento. Una simple observacin y exmenes anuales pudiera ser todo lo que necesita. Los medicamentos y la [place] son opciones para los problemas ms graves. El mdico lo ayudar a tomar una decisin acerca de lo que resulte lo mejor para usted. Dos tipos de medicamentos estn  disponibles para el alivio de los sntomas prostticos:  Hay medicamentos para Contractor. Esto ayuda a [employer]. Estos medicamentos tardan en Chief of Staff y pueden pasar meses antes de que se observe alguna mejora.  Los efectos adversos poco frecuentes incluyen problemas con la funcin sexual.  Medicamentos para relajar el msculo de la prstata. Esto tambin [employer] la obstruccin al reducir la compresin sobre la uretra. Este grupo de medicamentos funciona ms rpido que aquellos que reducen el tamao de la glndula prosttica. Generalmente se experimenta una mejora en [employer].  Los efectos adversos incluyen [place], [place], [place] varios tipos de tratamiento quirrgico disponibles para Public house manager los sntomas de la prstata:  Reseccin transuretral de la prstata (RTUP): En este tipo de tratamiento, se inserta un instrumento a travs de la abertura de la punta del pene. Se cortan trozos del Freight forwarder. Los trozos se retiran a Designer, jewellery de la misma abertura del pene. Esto mejora la obstruccin y [place] a [employer] sntomas.  Incisin transuretral (ITUP): En este procedimiento se hacen pequeos cortes en la prstata. sto hace que se alivie la presin de la prstata sobre la uretra.  Termoterapia transuretral con microondas (TTUM): En este procedimiento se utilizan microondas para crear calor. El calor destruye y extirpa una pequea cantidad de tejido prosttico.  Ablacin transuretral con aguja (ATUA): Este es un procedimiento en el que se utilizan frecuencias de radio para Field seismologist lo mismo que en el TTUM.  Coagulacin intersticial con lser (CIL): En este procedimiento se utiliza un rayo lser para hacer lo mismo que en el TTUM y el ATUA.  Electrovaporizacin transuretral (EVTU): En este procedimiento se utilizan electrodos para hacer los mismo que en los procedimientos enumerados anteriormente. SOLICITE ATENCIN MDICA SI:   Jaclynn Guarneri.  Siente un dolor inexplicable en la espalda.  Los sntomas no se alivian con los medicamentos recetados.  Los [employer] causan [employer].  La orina se vuelve muy oscura o tiene mal olor.  Siente que la parte inferior del abdomen est distendida y tiene dificultad para Garment/textile technologist. SOLICITE ATENCIN MDICA DE INMEDIATO SI:   Repentinamente no puede orinar. Esto es Engineer, maintenance (IT). Debe ser atendido inmediatamente.  Observa gran cantidad de sangre o cogulos sanguneos en la orina.  No puede controlar sus problemas urinarios.  Se siente confundido, tiene mareos intensos o se desmaya.  Siente dolor moderado a intenso en la espalda o en un flanco.  Siente escalofros o le sube la fiebre. Document Released: 01/16/2005 Document Revised: 01/21/2013 [facility] Patient Information 2015 [place]. This information is not intended to replace advice given to you by your health care provider. Make sure you discuss any questions you have with your health care provider.

## 2014-10-19 ENCOUNTER — Other Ambulatory Visit: Payer: Self-pay | Admitting: *Deleted

## 2014-10-19 DIAGNOSIS — M47812 Spondylosis without myelopathy or radiculopathy, cervical region: Secondary | ICD-10-CM

## 2014-10-23 DIAGNOSIS — M542 Cervicalgia: Secondary | ICD-10-CM | POA: Diagnosis not present

## 2014-11-04 ENCOUNTER — Ambulatory Visit: Payer: Medicare Other

## 2014-11-13 ENCOUNTER — Ambulatory Visit: Payer: Self-pay | Admitting: Urology

## 2014-11-25 ENCOUNTER — Ambulatory Visit (INDEPENDENT_AMBULATORY_CARE_PROVIDER_SITE_OTHER): Payer: Medicare Other | Admitting: Urology

## 2014-11-25 DIAGNOSIS — N329 Bladder disorder, unspecified: Secondary | ICD-10-CM

## 2014-11-25 DIAGNOSIS — N401 Enlarged prostate with lower urinary tract symptoms: Secondary | ICD-10-CM

## 2014-11-25 DIAGNOSIS — R361 Hematospermia: Secondary | ICD-10-CM

## 2014-12-21 DIAGNOSIS — M542 Cervicalgia: Secondary | ICD-10-CM | POA: Diagnosis not present

## 2014-12-30 ENCOUNTER — Ambulatory Visit (INDEPENDENT_AMBULATORY_CARE_PROVIDER_SITE_OTHER): Payer: Medicare Other | Admitting: Urology

## 2014-12-30 DIAGNOSIS — R351 Nocturia: Secondary | ICD-10-CM

## 2014-12-30 DIAGNOSIS — R3915 Urgency of urination: Secondary | ICD-10-CM

## 2014-12-30 DIAGNOSIS — N401 Enlarged prostate with lower urinary tract symptoms: Secondary | ICD-10-CM | POA: Diagnosis not present

## 2014-12-30 DIAGNOSIS — R361 Hematospermia: Secondary | ICD-10-CM

## 2014-12-31 DIAGNOSIS — M25512 Pain in left shoulder: Secondary | ICD-10-CM | POA: Diagnosis not present

## 2014-12-31 DIAGNOSIS — M25511 Pain in right shoulder: Secondary | ICD-10-CM | POA: Diagnosis not present

## 2015-01-06 DIAGNOSIS — M542 Cervicalgia: Secondary | ICD-10-CM | POA: Diagnosis not present

## 2015-01-06 DIAGNOSIS — M50322 Other cervical disc degeneration at C5-C6 level: Secondary | ICD-10-CM | POA: Diagnosis not present

## 2015-01-19 ENCOUNTER — Ambulatory Visit: Payer: Medicare Other | Attending: Physician Assistant | Admitting: Physical Therapy

## 2015-01-19 DIAGNOSIS — M542 Cervicalgia: Secondary | ICD-10-CM | POA: Insufficient documentation

## 2015-01-19 DIAGNOSIS — M25512 Pain in left shoulder: Secondary | ICD-10-CM | POA: Diagnosis not present

## 2015-01-19 DIAGNOSIS — M25511 Pain in right shoulder: Secondary | ICD-10-CM | POA: Diagnosis not present

## 2015-01-19 NOTE — Therapy (Signed)
[facility] [place], [place], [address] Phone: [phone]   Fax:  [phone]  Physical Therapy Evaluation  Patient Details  Name: Darren Rose MRN: [id number] Date of Birth: Dec 14, 1931 Referring Provider: Jeri Cos PA-C  Encounter Date: 01/19/2015      PT End of Session - 01/19/15 0935    Visit Number 1   Number of Visits 12   Date for PT Re-Evaluation 03/02/15   PT Start Time 0900   PT Stop Time 0942   PT Time Calculation (min) 42 min   Activity Tolerance Patient tolerated treatment well   Behavior During Therapy [facility] for tasks assessed/performed      Past Medical History  Diagnosis Date  . Hypertension   . Hyperlipidemia   . BPH (benign prostatic hypertrophy)     Past Surgical History  Procedure Laterality Date  . Finger surgery      There were no vitals filed for this visit.  Visit Diagnosis:  Neck pain  Pain of both shoulder joints      Subjective Assessment - 01/19/15 1021    Subjective Patient presented to clinic with daughter and interpreter.  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   Patient Stated Goals Have neck feel better.   Currently in Pain? No/denies            [facility] PT Assessment - 01/19/15 0001    Assessment   Medical Diagnosis Degeneration of intervertebral disc C5-6   Referring Provider Jeri Cos PA-C   Onset Date/Surgical Date --  ~6 months.   Precautions   Precautions None   Restrictions   Weight Bearing Restrictions No   Balance Screen   Has the patient fallen in the past 6 months No   Has the patient had a decrease in activity level because of a fear of falling?  No   Is the patient reluctant to leave their home because of a fear of falling?  No   Home Environment   Living Environment Private residence   Prior Function   Level of Independence Independent   Posture/Postural Control   Posture/Postural Control Postural limitations   Postural  Limitations Rounded Shoulders;Forward head   ROM / Strength   AROM / PROM / Strength AROM;Strength   AROM   AROM Assessment Site Cervical   Cervical - Right Rotation 62 degrees.   Cervical - Left Rotation 53 degrees.   Strength   Overall Strength Comments Normal bilateral UE strength.   Palpation   Palpation comment Tender to palpation over left levator scapulae.   Special Tests    Special Tests Rotator Cuff Impingement  1+/4+ bil UE DTR's.   Rotator Cuff Impingment tests Michel Bickers test   Hawkins-Kennedy test   Findings Positive   Side Left                   [facility] Adult PT Treatment/Exercise - 01/19/15 0001    Modalities   Modalities Electrical Stimulation;Moist Heat   Moist Heat Therapy   Number Minutes Moist Heat 15 Minutes   Moist Heat Location Cervical   Electrical Stimulation   Electrical Stimulation Location --  Left side of neck/levator scapulae.   Electrical Stimulation Action --  80-150 HZ x 15 minutes (5 sec on and 5 sec off).   Electrical Stimulation Goals Pain                  PT Short Term Goals - 01/19/15 1254  PT SHORT TERM GOAL #1   Title Ind with HEP.   Time 2   Period Weeks   Status New           PT Long Term Goals - 22-Jan-2015 1254    PT LONG TERM GOAL #1   Title Bilateral active cervical rotation= 65-70 degrees.   Time 6   Period Weeks   Status New   PT LONG TERM GOAL #2   Title Perform ADL's with pain not > 3/10.   Time 6   Period Weeks   Status New   PT LONG TERM GOAL #3   Title Sleep 6 hours undisturbed.   Time 6   Period Weeks   Status New               Plan - 22-Jan-2015 1228    Clinical Impression Statement The patient has had ongoing neck and shoulder pain left > right over the last 6 months.  He reports no pain at rest todaybut is palpably tender over his left levator scapulae.  He reports he has trouble getting to sleep at night due to pain and also rotating his neck and doing wood slitting  increases his pain as well.  His pain radiates into his left shoulder region in the area of the middle deltoid.   Pt will benefit from skilled therapeutic intervention in order to improve on the following deficits Pain;Decreased activity tolerance;Decreased range of motion   Rehab Potential Excellent   PT Frequency 2x / week   PT Duration 6 weeks   PT Treatment/Interventions ADLs/Self Care Home Management;Moist Heat;Electrical Stimulation;Ultrasound;Therapeutic activities;Therapeutic exercise;Patient/family education;Manual techniques;Passive range of motion   PT Next Visit Plan STW/M TP release to left levator scapulae; Modalities to this area; cervical range of motion and strengthening; right shoulder ER with red T-band.   Consulted and Agree with Plan of Care Other (Comment)  Via daughter and interpreter.          G-Codes - 01/22/2015 1246    Functional Assessment Tool Used FOTO.   Functional Limitation Other PT primary   Other PT Primary Current Status [id number]) At least 40 percent but less than 60 percent impaired, limited or restricted   Other PT Primary Goal Status [id number]) At least 1 percent but less than 20 percent impaired, limited or restricted       Problem List Patient Active Problem List   Diagnosis Date Noted  . Essential hypertension, benign 07/23/2012  . BPH (benign prostatic hyperplasia) 07/23/2012  . Cervicalgia 07/23/2012  . Hyperlipidemia with target LDL less than 100 07/23/2012    APPLEGATE, [place] MPT 01-22-15, 12:56 PM  [facility] [place], [place], [address] Phone: [phone]   Fax:  [phone]  Name: Nixon Mischler MRN: [id number] Date of Birth: 03-28-1931

## 2015-01-27 ENCOUNTER — Other Ambulatory Visit: Payer: Self-pay | Admitting: Urology

## 2015-01-27 ENCOUNTER — Ambulatory Visit: Payer: Medicare Other | Admitting: Physical Therapy

## 2015-01-27 DIAGNOSIS — M25512 Pain in left shoulder: Secondary | ICD-10-CM | POA: Diagnosis not present

## 2015-01-27 DIAGNOSIS — M542 Cervicalgia: Secondary | ICD-10-CM | POA: Diagnosis not present

## 2015-01-27 DIAGNOSIS — M25511 Pain in right shoulder: Secondary | ICD-10-CM | POA: Diagnosis not present

## 2015-01-27 NOTE — Therapy (Signed)
[facility] [place], [place], [address] Phone: [phone]   Fax:  [phone]  Physical Therapy Treatment  Patient Details  Name: Darren Rose MRN: [id number] Date of Birth: 1931/10/27 Referring Provider: Jeri Cos PA-C  Encounter Date: 01/27/2015      PT End of Session - 01/27/15 0903    Visit Number 2   Number of Visits 12   Date for PT Re-Evaluation 03/02/15   PT Start Time 0903   PT Stop Time 1000   PT Time Calculation (min) 57 min   Activity Tolerance Patient tolerated treatment well   Behavior During Therapy [facility] for tasks assessed/performed      Past Medical History  Diagnosis Date  . Hypertension   . Hyperlipidemia   . BPH (benign prostatic hypertrophy)     Past Surgical History  Procedure Laterality Date  . Finger surgery      There were no vitals filed for this visit.  Visit Diagnosis:  Neck pain  Pain of both shoulder joints      Subjective Assessment - 01/27/15 0903    Subjective Patient states he had a few days where the neck felt really well. He thought he was going to be okay, but then pain returned.   Patient is accompained by: Family member;Interpreter   Patient Stated Goals Have neck feel better.   Currently in Pain? Yes   Pain Score 7    Pain Location Neck   Pain Orientation Left   Pain Descriptors / Indicators Sharp   Pain Radiating Towards from arm to neck   Pain Onset More than a month ago   Pain Frequency Intermittent   Effect of Pain on Daily Activities not sleeping well                         [facility] Adult PT Treatment/Exercise - 01/27/15 0001    Self-Care   Self-Care --  towel roll for pillow   Exercises   Exercises Neck   Neck Exercises: Seated   Neck Retraction 10 reps   Modalities   Modalities Electrical Stimulation;Moist Heat   Moist Heat Therapy   Number Minutes Moist Heat 15 Minutes   Moist Heat Location Cervical   Electrical  Stimulation   Electrical Stimulation Location left med scap and UT   Electrical Stimulation Action premod   Electrical Stimulation Parameters 80-150 hz to tolerance x 15 min   Electrical Stimulation Goals Pain   Manual Therapy   Manual Therapy Soft tissue mobilization;Passive ROM   Soft tissue mobilization to left UT, lev scapula, paraspinals   Passive ROM stretch to UT, [employer] and scalenes   Neck Exercises: Stretches   Upper Trapezius Stretch 2 reps;30 seconds   Levator Stretch 2 reps;30 seconds                PT Education - 01/27/15 0954    Education provided Yes   Education Details HEP; showed patient how to use towel roll in his pillow   Person(s) Educated Patient;Child(ren)   Methods Explanation;Demonstration;Handout   Comprehension Verbalized understanding;Returned demonstration          PT Short Term Goals - 01/19/15 1254    PT SHORT TERM GOAL #1   Title Ind with HEP.   Time 2   Period Weeks   Status New           PT Long Term Goals - 01/19/15 1254    PT LONG  TERM GOAL #1   Title Bilateral active cervical rotation= 65-70 degrees.   Time 6   Period Weeks   Status New   PT LONG TERM GOAL #2   Title Perform ADL's with pain not > 3/10.   Time 6   Period Weeks   Status New   PT LONG TERM GOAL #3   Title Sleep 6 hours undisturbed.   Time 6   Period Weeks   Status New               Plan - 01/27/15 3672    Clinical Impression Statement Patient did well with treatment with help of interpreter. He reported relief with cervical retraction. He did report that he wouldn't be able to stretch 3x/day because he has to work, but he will try. He has multiple trigger points in L UT and levator scapula but responded well to manual and estim/heat reporting 0/10 pain at end. No goals met as only second visit.   Pt will benefit from skilled therapeutic intervention in order to improve on the following deficits Pain;Decreased activity tolerance;Decreased  range of motion   Rehab Potential Excellent   PT Frequency 2x / week   PT Duration 6 weeks   PT Treatment/Interventions ADLs/Self Care Home Management;Moist Heat;Electrical Stimulation;Ultrasound;Therapeutic activities;Therapeutic exercise;Patient/family education;Manual techniques;Passive range of motion   PT Next Visit Plan Try [place] to L UT and levator TPs, continue STW, review stretches. Add scapular strengthening as tolerated. Add R shoulder ER.   PT Home Exercise Plan levator and UT stretch, cervical retraction   Consulted and Agree with Plan of Care Patient        Problem List Patient Active Problem List   Diagnosis Date Noted  . Essential hypertension, benign 07/23/2012  . BPH (benign prostatic hyperplasia) 07/23/2012  . Cervicalgia 07/23/2012  . Hyperlipidemia with target LDL less than 100 07/23/2012    [name] PT  01/27/2015, 10:13 AM  [facility] [address] [place], [place], [address] Phone: [phone]   Fax:  [phone]  Name: Darren Rose MRN: [id number] Date of Birth: 29-Dec-1931

## 2015-01-27 NOTE — Patient Instructions (Signed)
Flexibility: Upper Trapezius Stretch    Tome del lado derecho de la cabeza con Plato, coloque el otro brazo por detrs de la espalda. Incline la cabeza hacia el hombro hasta sentir un estiramiento. Sostenga _30___ segundos. Repita __3__ [name] por rutina. Realice ____ rutinas por sesin. Realice _2 ___ sesiones por da.  http://orth.exer.us/340   Copyright  VHI. All rights reserved.   Levator Scapula Stretch    Coloque la mano izquierda sobre la escapula del mismo lado. Con la otra mano lleve la cabeza hacia abajo y afuera. Sostenga __30__ segundos. Repita __3__ [name] por rutina. Realice ____ rutinas por sesin. Realice [id number] sesiones por da.  http://orth.exer.us/348   Copyright  VHI. All rights reserved.   Axial Extension (Chin Tuck)    Hunda el mentn y alargue la parte posterior del cuello. Mantenga _5___ segundos mientras cuenta en voz alta. Repita _10___ veces. Haga _3___ sesiones por da.  http://gt2.exer.us/449   Copyright  VHI. All rights reserved.   Darren Rose, PT 01/27/2015 9:23 AM [facility] [address] [place], [place], [address] Phone: [phone]   Fax:  [phone]

## 2015-01-28 DIAGNOSIS — M25511 Pain in right shoulder: Secondary | ICD-10-CM | POA: Diagnosis not present

## 2015-01-28 DIAGNOSIS — G8929 Other chronic pain: Secondary | ICD-10-CM | POA: Diagnosis not present

## 2015-01-28 DIAGNOSIS — M25512 Pain in left shoulder: Secondary | ICD-10-CM | POA: Diagnosis not present

## 2015-01-28 DIAGNOSIS — M50322 Other cervical disc degeneration at C5-C6 level: Secondary | ICD-10-CM | POA: Diagnosis not present

## 2015-02-03 ENCOUNTER — Encounter: Payer: Medicare Other | Admitting: Physical Therapy

## 2015-02-03 DIAGNOSIS — M50322 Other cervical disc degeneration at C5-C6 level: Secondary | ICD-10-CM | POA: Diagnosis not present

## 2015-02-05 ENCOUNTER — Ambulatory Visit: Payer: Medicare Other | Attending: Physician Assistant | Admitting: Physical Therapy

## 2015-02-05 DIAGNOSIS — M25512 Pain in left shoulder: Secondary | ICD-10-CM | POA: Diagnosis not present

## 2015-02-05 DIAGNOSIS — M25511 Pain in right shoulder: Secondary | ICD-10-CM | POA: Insufficient documentation

## 2015-02-05 DIAGNOSIS — M542 Cervicalgia: Secondary | ICD-10-CM | POA: Diagnosis not present

## 2015-02-05 NOTE — Patient Instructions (Signed)
Flexibility: Corner Stretch    De pie, en una esquina con las manos ligeramente por encima de la altura de los hombros y los pies a ____ cm. de la esquina inclnese hacia adelante hasta sentir un estiramiento a travs del pecho. Sostenga _30___ segundos. Repita ___3_ [name] por rutina. Realice ____ rutinas por sesin. Realice [id number] sesiones por da.  http://orth.exer.us/342   Copyright  VHI. All rights reserved.   [name], PT 02/05/2015 8:53 AM [facility] [address] [place], [place], [address] Phone: [phone]   Fax:  [phone]

## 2015-02-05 NOTE — Therapy (Signed)
[facility] [place], [place], [address] Phone: [phone]   Fax:  [phone]  Physical Therapy Treatment  Patient Details  Name: Darren Rose MRN: [id number] Date of Birth: 06-30-1931 Referring Provider: Jeri Cos PA-C  Encounter Date: 02/05/2015      PT End of Session - 02/05/15 0809    Visit Number 3   Number of Visits 12   Date for PT Re-Evaluation 03/02/15   PT Start Time 0815   PT Stop Time 0905   PT Time Calculation (min) 50 min   Activity Tolerance Patient tolerated treatment well   Behavior During Therapy [facility] for tasks assessed/performed      Past Medical History  Diagnosis Date  . Hypertension   . Hyperlipidemia   . BPH (benign prostatic hypertrophy)     Past Surgical History  Procedure Laterality Date  . Finger surgery      There were no vitals filed for this visit.  Visit Diagnosis:  Neck pain  Pain of both shoulder joints      Subjective Assessment - 02/05/15 0810    Subjective I've been feeling well. He had an injection in his neck on 02/03/15, but no significant changes.   Patient is accompained by: Interpreter   Patient Stated Goals Have neck feel better.   Currently in Pain? Yes   Pain Score 7    Pain Location Neck   Pain Orientation Left   Pain Descriptors / Indicators Sharp   Pain Onset More than a month ago   Pain Frequency Intermittent   Effect of Pain on Daily Activities affects sleep and his work            [facility] PT Assessment - 02/05/15 0001    Assessment   Medical Diagnosis Degeneration of intervertebral disc C5-6   AROM   AROM Assessment Site Cervical   Cervical - Right Rotation 80 deg   Cervical - Left Rotation 65 deg                     [facility] Adult PT Treatment/Exercise - 02/05/15 0001    Neck Exercises: Standing   Neck Retraction 5 reps;10 secs   Modalities   Modalities Electrical Stimulation;Ultrasound   Moist Heat Therapy   Number  Minutes Moist Heat 15 Minutes   Moist Heat Location Cervical   Electrical Stimulation   Electrical Stimulation Location L UT/Lev scap   Electrical Stimulation Action premod   Electrical Stimulation Parameters 80-150 hz to tolerance x 15 min   Electrical Stimulation Goals Pain   Ultrasound   Ultrasound Location L levator scap, UT and med scapular border   Ultrasound Parameters 1.5 wcm2 1 mhz cont x 10 min   Ultrasound Goals Pain   Manual Therapy   Manual Therapy Soft tissue mobilization;Passive ROM   Soft tissue mobilization to left UT, lev scapula, paraspinals   Neck Exercises: Stretches   Levator Stretch 1 rep;10 seconds   Corner Stretch 1 rep;20 seconds                PT Education - 02/05/15 0853    Education provided Yes   Education Details HEP   Person(s) Educated Patient;Other (comment)   Methods Explanation;Demonstration;Handout   Comprehension Verbalized understanding;Returned demonstration          PT Short Term Goals - 02/05/15 1050    PT SHORT TERM GOAL #1   Title Ind with HEP.   Time 2   Period Weeks  Status Achieved           PT Long Term Goals - 02/05/15 1050    PT LONG TERM GOAL #1   Title Bilateral active cervical rotation= 65-70 degrees.   Time 6   Period Weeks   Status Achieved   PT LONG TERM GOAL #2   Title Perform ADL's with pain not > 3/10.   Time 6   Period Weeks   Status On-going   PT LONG TERM GOAL #3   Title Sleep 6 hours undisturbed.   Time 6   Period Weeks   Status On-going               Plan - 02/05/15 0857    Clinical Impression Statement Patient is getting temporary relief at this time, but his pain remains. He states that he gets relief from cervical stretches and retraction and is compliant with these. He has made significant improvement in cervical rotation B however L rotation is still 15 deg less than right. He states the injection did not help at all. He continues to have active TPs in L UT and levator  scapuls. Goals are ongoing.    Pt will benefit from skilled therapeutic intervention in order to improve on the following deficits Pain;Decreased activity tolerance;Decreased range of motion   Rehab Potential Excellent   PT Frequency 2x / week   PT Duration 6 weeks   PT Treatment/Interventions ADLs/Self Care Home Management;Moist Heat;Electrical Stimulation;Ultrasound;Therapeutic activities;Therapeutic exercise;Patient/family education;Manual techniques;Passive range of motion   PT Next Visit Plan Continue modalities, STW and add scap strengtheing. R shoulder ER with red band.   Consulted and Agree with Plan of Care Patient        Problem List Patient Active Problem List   Diagnosis Date Noted  . Essential hypertension, benign 07/23/2012  . BPH (benign prostatic hyperplasia) 07/23/2012  . Cervicalgia 07/23/2012  . Hyperlipidemia with target LDL less than 100 07/23/2012    [name] PT  02/05/2015, 10:52 AM  [facility] [address] [place], [place], [address] Phone: [phone]   Fax:  [phone]  Name: Darren Rose MRN: [id number] Date of Birth: 1931/12/11

## 2015-02-12 ENCOUNTER — Ambulatory Visit: Payer: Medicare Other | Admitting: Physical Therapy

## 2015-02-12 DIAGNOSIS — M25511 Pain in right shoulder: Secondary | ICD-10-CM | POA: Diagnosis not present

## 2015-02-12 DIAGNOSIS — M25512 Pain in left shoulder: Secondary | ICD-10-CM | POA: Diagnosis not present

## 2015-02-12 DIAGNOSIS — M542 Cervicalgia: Secondary | ICD-10-CM

## 2015-02-12 NOTE — Patient Instructions (Addendum)
Scapular Retraction: Bilateral    De frente al punto de agarre, lleve los brazos hacia atrs, mientras junta los omplatos. Repita __20-30__ veces por rutina. Realice ____ rutinas por sesin. Realice [id number] sesiones por da.  http://orth.exer.us/176    Patient also given B shoulder extension with h/o from drawer.  Copyright  VHI. All rights reserved.  [name], PT 02/12/2015 12:08 PM [facility] [address] [place], [place], [address] Phone: [phone]   Fax:  [phone]

## 2015-02-12 NOTE — Therapy (Signed)
[facility] [place], [place], [address] Phone: [phone]   Fax:  [phone]  Physical Therapy Treatment  Patient Details  Name: Darren Rose MRN: [id number] Date of Birth: Jun 26, 1931 Referring Provider: Jeri Cos PA-C  Encounter Date: 02/12/2015      PT End of Session - 02/12/15 0903    Visit Number 4   Number of Visits 12   Date for PT Re-Evaluation 03/02/15   PT Start Time 0903   PT Stop Time 1005   PT Time Calculation (min) 62 min      Past Medical History  Diagnosis Date  . Hypertension   . Hyperlipidemia   . BPH (benign prostatic hypertrophy)     Past Surgical History  Procedure Laterality Date  . Finger surgery      There were no vitals filed for this visit.  Visit Diagnosis:  Neck pain      Subjective Assessment - 02/12/15 0903    Subjective Patient states he is a little bit better. He continues to sleep terribly, but reports that his sleep is not disturbed because of neck pain. He denies any shoulder pain. He states that normally he feels pain all the time but last week he only had the pain 3-4 times.   Patient is accompained by: Interpreter  Nicole Kindred   Patient Stated Goals Have neck feel better.   Currently in Pain? Yes   Pain Score 7    Pain Location Neck   Pain Orientation Left   Pain Descriptors / Indicators Sharp   Pain Onset More than a month ago                         [facility] Adult PT Treatment/Exercise - 02/12/15 0001    Neck Exercises: Theraband   Scapula Retraction 20 reps;Red   Shoulder Extension 20 reps;Red   Horizontal ABduction 15 reps;Red   Horizontal ABduction Limitations Stopped due to grinding in R shoulder; pt denied pain   Modalities   Modalities Electrical Stimulation;Moist Heat;Ultrasound   Moist Heat Therapy   Number Minutes Moist Heat 15 Minutes   Moist Heat Location Cervical   Electrical Stimulation   Electrical Stimulation Location L UT   Electrical Stimulation Action premod   Electrical Stimulation Parameters 80-150 Hz to tolerance x 15 min   Electrical Stimulation Goals Pain   Ultrasound   Ultrasound Location L UT, lev scap and supraspinatus   Ultrasound Parameters 1.5 wcm2 1 mhz cont x 10 min   Ultrasound Goals Pain   Manual Therapy   Manual Therapy Soft tissue mobilization;Myofascial release   Soft tissue mobilization to L UT, lev scap and supraspinatus   Myofascial Release TPR to supraspinatus and UT with active shoulder flexion   Passive ROM passive stretch to L lev scap                PT Education - 02/12/15 1209    Education provided Yes   Education Details HEP   Person(s) Educated Patient   Methods Explanation;Demonstration;Handout   Comprehension Verbalized understanding;Returned demonstration          PT Short Term Goals - 02/05/15 1050    PT SHORT TERM GOAL #1   Title Ind with HEP.   Time 2   Period Weeks   Status Achieved           PT Long Term Goals - 02/12/15 1207    PT LONG TERM GOAL #1  Title Bilateral active cervical rotation= 65-70 degrees.   Time 6   Period Weeks   Status Achieved   PT LONG TERM GOAL #2   Title Perform ADL's with pain not > 3/10.   Time 6   Period Weeks   Status On-going   PT LONG TERM GOAL #3   Title Sleep 6 hours undisturbed.   Time 6   Period Weeks   Status Deferred  pt reports difficulty sleeping not due to neck pain               Plan - 02/12/15 0909    Clinical Impression Statement Therapist spent 10 minutes reviewing symptoms and goals through interpreter including discussing his problem with sleeping. Patient reports he is a 'little" better however throughout appointment indicates more significant improvements. He stated through interpreter that he may be able to decrease frequency after next visit.  He is very compliant with HEP and states the corner stretch makes him feel the best and he does it 3-4 x/day. He did well with  theraband TE. My impression is that the patient may be overzealous with TE and should be monitored.    Pt will benefit from skilled therapeutic intervention in order to improve on the following deficits Pain;Decreased activity tolerance;Decreased range of motion   Rehab Potential Excellent   PT Frequency 2x / week   PT Duration 6 weeks   PT Treatment/Interventions ADLs/Self Care Home Management;Moist Heat;Electrical Stimulation;Ultrasound;Therapeutic activities;Therapeutic exercise;Patient/family education;Manual techniques;Passive range of motion   PT Next Visit Plan Continue modalities, STW and add scap strengtheing. Review TBand exercises.   Consulted and Agree with Plan of Care Patient;Other (Comment)        Problem List Patient Active Problem List   Diagnosis Date Noted  . Essential hypertension, benign 07/23/2012  . BPH (benign prostatic hyperplasia) 07/23/2012  . Cervicalgia 07/23/2012  . Hyperlipidemia with target LDL less than 100 07/23/2012    Darren Rose PT  02/12/2015, 12:20 PM  [facility] [place], [place], [address] Phone: [phone]   Fax:  [phone]  Name: Darren Rose MRN: [id number] Date of Birth: [birth year]

## 2015-02-19 ENCOUNTER — Ambulatory Visit: Payer: Medicare Other | Admitting: Physical Therapy

## 2015-02-19 ENCOUNTER — Encounter: Payer: Self-pay | Admitting: Physical Therapy

## 2015-02-19 DIAGNOSIS — M25512 Pain in left shoulder: Secondary | ICD-10-CM

## 2015-02-19 DIAGNOSIS — M542 Cervicalgia: Secondary | ICD-10-CM | POA: Diagnosis not present

## 2015-02-19 DIAGNOSIS — M25511 Pain in right shoulder: Secondary | ICD-10-CM

## 2015-02-19 NOTE — Patient Instructions (Signed)
Flexibility: Upper Trapezius Stretch    Gently grasp right side of head while reaching behind back with other hand. Tilt head away until a gentle stretch is felt. Hold _30___ seconds. Repeat __3__ times per set. Do ____ sets per session. Do _3___ sessions per day.  http://orth.exer.us/340   Copyright  VHI. All rights reserved.  Levator Scapula Stretch    Place left hand on same side shoulder blade. With other hand, gently stretch head down and away. Hold _30___ seconds. Repeat __3__ times per set. Do ____ sets per session. Do __3__ sessions per day.  http://orth.exer.us/348   Copyright  VHI. All rights reserved.

## 2015-02-19 NOTE — Therapy (Signed)
[facility] [place], [place], [address] Phone: [phone]   Fax:  [phone]  Physical Therapy Treatment  Patient Details  Name: Darren Rose MRN: [id number] Date of Birth: 10/27/1931 Referring Provider: Jeri Cos PA-C  Encounter Date: 02/19/2015      PT End of Session - 02/19/15 0905    Visit Number 5   Number of Visits 12   Date for PT Re-Evaluation 03/02/15   PT Start Time 0905   PT Stop Time 0944   PT Time Calculation (min) 39 min   Activity Tolerance Patient tolerated treatment well   Behavior During Therapy [facility] for tasks assessed/performed      Past Medical History  Diagnosis Date  . Hypertension   . Hyperlipidemia   . BPH (benign prostatic hypertrophy)     Past Surgical History  Procedure Laterality Date  . Finger surgery      There were no vitals filed for this visit.  Visit Diagnosis:  Neck pain  Pain of both shoulder joints      Subjective Assessment - 02/19/15 0903    Subjective Reports that neck is "so so" and that patient wishes to continue PT due to benefits but would like to move to every other week.   Patient is accompained by: Interpreter   Patient Stated Goals Have neck feel better.   Currently in Pain? Yes   Pain Score 5    Pain Location Neck   Pain Orientation Left   Pain Onset More than a month ago            [facility] PT Assessment - 02/19/15 0001    Assessment   Medical Diagnosis Degeneration of intervertebral disc C5-6                     [facility] Adult PT Treatment/Exercise - 02/19/15 0001    Modalities   Modalities Ultrasound   Ultrasound   Ultrasound Location L UT/ Levator Scapula/ cervical parspinals   Ultrasound Parameters 1.5 w/cm2, 100%,1 mhz x10 min   Ultrasound Goals Pain   Manual Therapy   Manual Therapy Myofascial release   Myofascial Release TPR/MFR to L UT/ Levator Scapula/ cervical paraspinals to decrease tightness and pain                 PT Education - 02/19/15 0944    Education provided Yes   Education Details HEP- UT, Levator Scapula stretch   Person(s) Educated Patient;Other (comment)  Interpreter   Methods Explanation;Demonstration;Verbal cues;Handout   Comprehension Verbalized understanding;Returned demonstration;Verbal cues required          PT Short Term Goals - 02/05/15 1050    PT SHORT TERM GOAL #1   Title Ind with HEP.   Time 2   Period Weeks   Status Achieved           PT Long Term Goals - 02/12/15 1207    PT LONG TERM GOAL #1   Title Bilateral active cervical rotation= 65-70 degrees.   Time 6   Period Weeks   Status Achieved   PT LONG TERM GOAL #2   Title Perform ADL's with pain not > 3/10.   Time 6   Period Weeks   Status On-going   PT LONG TERM GOAL #3   Title Sleep 6 hours undisturbed.   Time 6   Period Weeks   Status Deferred  pt reports difficulty sleeping not due to neck pain  Plan - 02/19/15 0949    Clinical Impression Statement Patient tolerated today's treatment well and communication was completed per interpreter. Normal [place] response noted following end of the [place] session. Patient presented with increased tightness in L UT/ Levator Scapula region especially at mediosuperior angle of the L scapula. Patient noted that that area has become less tight with the treatments. Patient/interpreter were educated regarding the stretches to be completed and parameters with verbalized understanding. Patient also communicated that he uses one pillow for sleep and that the LUE is the UE he uses for work.   Pt will benefit from skilled therapeutic intervention in order to improve on the following deficits Pain;Decreased activity tolerance;Decreased range of motion   Rehab Potential Excellent   PT Frequency 2x / week   PT Duration 6 weeks   PT Treatment/Interventions ADLs/Self Care Home Management;Moist Heat;Electrical Stimulation;Ultrasound;Therapeutic  activities;Therapeutic exercise;Patient/family education;Manual techniques;Passive range of motion   PT Next Visit Plan Continue modalities, STW and add scap strengtheing. Review TBand exercises.   PT Home Exercise Plan levator and UT stretch, cervical retraction   Consulted and Agree with Plan of Care Patient;Other (Comment)  Interpreter        Problem List Patient Active Problem List   Diagnosis Date Noted  . Essential hypertension, benign 07/23/2012  . BPH (benign prostatic hyperplasia) 07/23/2012  . Cervicalgia 07/23/2012  . Hyperlipidemia with target LDL less than 100 07/23/2012    Wynelle Fanny, PTA 02/19/2015, 10:51 AM  [facility] [address] [place], [place], [address] Phone: [phone]   Fax:  [phone]  Name: Darren Rose MRN: [id number] Date of Birth: [birth year]

## 2015-02-26 ENCOUNTER — Encounter: Payer: Medicare Other | Admitting: Physical Therapy

## 2015-03-05 ENCOUNTER — Encounter: Payer: Self-pay | Admitting: Physical Therapy

## 2015-03-05 ENCOUNTER — Encounter: Payer: Self-pay | Admitting: Family Medicine

## 2015-03-05 ENCOUNTER — Ambulatory Visit (INDEPENDENT_AMBULATORY_CARE_PROVIDER_SITE_OTHER): Payer: Medicare Other | Admitting: Family Medicine

## 2015-03-05 ENCOUNTER — Ambulatory Visit: Payer: Medicare Other | Attending: Physician Assistant | Admitting: Physical Therapy

## 2015-03-05 VITALS — BP 131/77 | HR 61 | Temp 98.0°F | Ht 64.0 in | Wt 132.8 lb

## 2015-03-05 DIAGNOSIS — E785 Hyperlipidemia, unspecified: Secondary | ICD-10-CM | POA: Diagnosis not present

## 2015-03-05 DIAGNOSIS — M25512 Pain in left shoulder: Secondary | ICD-10-CM | POA: Diagnosis not present

## 2015-03-05 DIAGNOSIS — M542 Cervicalgia: Secondary | ICD-10-CM

## 2015-03-05 DIAGNOSIS — N400 Enlarged prostate without lower urinary tract symptoms: Secondary | ICD-10-CM | POA: Diagnosis not present

## 2015-03-05 DIAGNOSIS — I10 Essential (primary) hypertension: Secondary | ICD-10-CM

## 2015-03-05 DIAGNOSIS — M25511 Pain in right shoulder: Secondary | ICD-10-CM | POA: Diagnosis not present

## 2015-03-05 MED ORDER — HYDROCHLOROTHIAZIDE 25 MG PO TABS: ORAL_TABLET | ORAL | Status: DC

## 2015-03-05 MED ORDER — MELOXICAM 7.5 MG PO TABS: ORAL_TABLET | ORAL | Status: DC

## 2015-03-05 MED ORDER — LISINOPRIL 40 MG PO TABS: ORAL_TABLET | ORAL | Status: DC

## 2015-03-05 MED ORDER — ATORVASTATIN CALCIUM 40 MG PO TABS: ORAL_TABLET | ORAL | Status: DC

## 2015-03-05 MED ORDER — TAMSULOSIN HCL 0.4 MG PO CAPS: 0.4000 mg | ORAL_CAPSULE | Freq: Every day | ORAL | Status: DC

## 2015-03-05 NOTE — Progress Notes (Signed)
BP 131/77 mmHg  Pulse 61  Temp(Src) 98 F (36.7 C) (Oral)  Ht _0  (1.626 m)  Wt 132 lb 12.8 oz (60.238 kg)  BMI 22.78 kg/m2   Subjective:    Patient ID: Darren Rose, male    DOB: 07-31-1931, 82 y.o.   MRN: [id number]  HPI: Darren Rose is a 82 y.o. male presenting on 03/05/2015 for Annual Exam   HPI Hypertension recheck Patient is coming in today for hypertension recheck prior to going on a long trip for 2 months to [place]. He was hoping to get 90 day prescription so that it would cover his trip. He is currently on lisinopril and hydrochlorothiazide and his blood pressure today is 131/77. Patient denies headaches, blurred vision, chest pains, shortness of breath, or weakness. Denies any side effects from medication and is content with current medication.   Hyperlipidemia Patient is currently on atorvastatin 40 mg for his cholesterol and he denies any issues such as myalgias or liver problems with it. He is coming in today because he wants to get a recheck and make sure that is normal before going on a trip.  Enlarged prostate Patient has been on Flomax for his enlarged prostate for at least a few years and it is working well and he is not having any issues with his stream or urinating too frequently at night. Usually urinates 1-2 times at night. He denies any issues with dry mouth or constipation  Relevant past medical, surgical, family and social history reviewed and updated as indicated. Interim medical history since our last visit reviewed. Allergies and medications reviewed and updated.  Review of Systems  Constitutional: Negative for fever and chills.  HENT: Negative for congestion, ear discharge and ear pain.   Eyes: Negative for discharge and visual disturbance.  Respiratory: Negative for shortness of breath and wheezing.   Cardiovascular: Negative for chest pain and leg swelling.  Gastrointestinal: Negative for abdominal pain, diarrhea and constipation.    Genitourinary: Negative for difficulty urinating.  Musculoskeletal: Negative for back pain and gait problem.  Skin: Negative for rash.  Neurological: Negative for dizziness, syncope, light-headedness and headaches.  All other systems reviewed and are negative.   Per HPI unless specifically indicated above     Medication List       This list is accurate as of: 03/05/15  3:15 PM.  Always use your most recent med list.               atorvastatin 40 MG tablet  Commonly known as:  LIPITOR  TAKE 1 TABLET (40 MG TOTAL) BY MOUTH DAILY AT 6 PM.     docusate sodium 100 MG capsule  Commonly known as:  COLACE  Take 1 capsule (100 mg total) by mouth 2 (two) times daily.     hydrochlorothiazide 25 MG tablet  Commonly known as:  HYDRODIURIL  TAKE 1 TABLET (25 MG TOTAL) BY MOUTH DAILY.     lisinopril 40 MG tablet  Commonly known as:  PRINIVIL,ZESTRIL  TAKE 1 TABLET (40 MG TOTAL) BY MOUTH DAILY.     meloxicam 7.5 MG tablet  Commonly known as:  MOBIC  TOME UNA TABLETA TODOS LOS DIAS     tamsulosin 0.4 MG Caps capsule  Commonly known as:  FLOMAX  Take 1 capsule (0.4 mg total) by mouth daily.           Objective:    BP 131/77 mmHg  Pulse 61  Temp(Src) 98 F (36.7 C) (Oral)  Ht _0  (1.626 m)  Wt 132 lb 12.8 oz (60.238 kg)  BMI 22.78 kg/m2  Wt Readings from Last 3 Encounters:  03/05/15 132 lb 12.8 oz (60.238 kg)  10/16/14 134 lb (60.782 kg)  04/07/14 135 lb (61.236 kg)    Physical Exam  Constitutional: He is oriented to person, place, and time. He appears well-developed and well-nourished. No distress.  Eyes: Conjunctivae and EOM are normal. Pupils are equal, round, and reactive to light. Right eye exhibits no discharge. No scleral icterus.  Cardiovascular: Normal rate, regular rhythm, normal heart sounds and intact distal pulses.   No murmur heard. Pulmonary/Chest: Effort normal and breath sounds normal. No respiratory distress. He has no wheezes.  Abdominal: He  exhibits no distension. There is no tenderness. There is no rebound and no guarding.  Musculoskeletal: Normal range of motion. He exhibits no edema or tenderness.  Neurological: He is alert and oriented to person, place, and time. Coordination normal.  Skin: Skin is warm and dry. No rash noted. He is not diaphoretic.  Psychiatric: He has a normal mood and affect. His behavior is normal.  Nursing note and vitals reviewed.      Assessment & Plan:   Problem List Items Addressed This Visit      Cardiovascular and Mediastinum   Essential hypertension, benign - Primary   Relevant Medications   atorvastatin (LIPITOR) 40 MG tablet   hydrochlorothiazide (HYDRODIURIL) 25 MG tablet   lisinopril (PRINIVIL,ZESTRIL) 40 MG tablet   Other Relevant Orders   CMP14+EGFR     Genitourinary   BPH (benign prostatic hyperplasia)   Relevant Medications   tamsulosin (FLOMAX) 0.4 MG CAPS capsule   Other Relevant Orders   PSA, total and free     Other   Hyperlipidemia with target LDL less than 100   Relevant Medications   atorvastatin (LIPITOR) 40 MG tablet   hydrochlorothiazide (HYDRODIURIL) 25 MG tablet   lisinopril (PRINIVIL,ZESTRIL) 40 MG tablet   Other Relevant Orders   Lipid panel       Follow up plan: Return in about 6 months (around 09/02/2015), or if symptoms worsen or fail to improve.  Counseling provided for all of the vaccine components Orders Placed This Encounter  Procedures  . CMP14+EGFR  . Lipid panel  . PSA, total and free    Caryl Pina, MD [facility] 03/05/2015, 3:15 PM

## 2015-03-05 NOTE — Therapy (Signed)
[facility] [place], [place], [address] Phone: [phone]   Fax:  [phone]  Physical Therapy Treatment  Patient Details  Name: Darren Rose MRN: [id number] Date of Birth: [birth year] Referring Provider: Jeri Cos PA-C  Encounter Date: 03/05/2015      PT End of Session - 03/05/15 0859    Visit Number 6   Number of Visits 12   Date for PT Re-Evaluation 03/02/15   PT Start Time 0900   PT Stop Time 0944   PT Time Calculation (min) 44 min   Activity Tolerance Patient tolerated treatment well   Behavior During Therapy [facility] for tasks assessed/performed      Past Medical History  Diagnosis Date  . Hypertension   . Hyperlipidemia   . BPH (benign prostatic hypertrophy)     Past Surgical History  Procedure Laterality Date  . Finger surgery      There were no vitals filed for this visit.  Visit Diagnosis:  Neck pain  Pain of both shoulder joints      Subjective Assessment - 03/05/15 0859    Subjective States that his neck is much better. Reports a burning sensation in L cervical musculature last time he drove to [place]. Reports he will be gone to [place] for 2 months and may be done with PT or call back once he gets back.   Patient is accompained by: Interpreter   Patient Stated Goals Have neck feel better.   Currently in Pain? No/denies            [facility] PT Assessment - 03/05/15 0001    Assessment   Medical Diagnosis Degeneration of intervertebral disc C5-6                     [facility] Adult PT Treatment/Exercise - 03/05/15 0001    Modalities   Modalities Ultrasound   Ultrasound   Ultrasound Location L UT/ Levator Scapula   Ultrasound Parameters 1.5 w/cm2, 100%,1 mhz x10 min   Ultrasound Goals Other (Comment)  Tone   Manual Therapy   Manual Therapy Myofascial release   Myofascial Release TPR/MFR to L UT/ Levator Scapula/ cervical paraspinals to decrease tightness                   PT Short Term Goals - 02/05/15 1050    PT SHORT TERM GOAL #1   Title Ind with HEP.   Time 2   Period Weeks   Status Achieved           PT Long Term Goals - 03/05/15 0930    PT LONG TERM GOAL #1   Title Bilateral active cervical rotation= 65-70 degrees.   Time 6   Period Weeks   Status Achieved   PT LONG TERM GOAL #2   Title Perform ADL's with pain not > 3/10.   Time 6   Period Weeks   Status Achieved   PT LONG TERM GOAL #3   Title Sleep 6 hours undisturbed.   Time 6   Period Weeks   Status Deferred  pt reports difficulty sleeping not due to neck pain               Plan - 03/05/15 1032    Clinical Impression Statement Patient tolerated today's treatment very well and reported experiencing a great improvement. Normal [place] response noted following end of the [place] session. Tightness noted in L medial UT/ Levator Scapula had greatly decreased since previous treatment although  manual therapy was continued today to further decrease the tightness and following the fact that he will be gone to [place] for 2 months. Patient and interpreter were both educated to continue both HEPs given in previous treatments as to decrease pain or tension he may experience especially with driving as he had reported. Patient has achieved all goals set at evaluation other than sleep goal which was deferred previously.   Pt will benefit from skilled therapeutic intervention in order to improve on the following deficits Pain;Decreased activity tolerance;Decreased range of motion   Rehab Potential Excellent   PT Frequency 2x / week   PT Duration 6 weeks   PT Treatment/Interventions ADLs/Self Care Home Management;Moist Heat;Electrical Stimulation;Ultrasound;Therapeutic activities;Therapeutic exercise;Patient/family education;Manual techniques;Passive range of motion   PT Next Visit Plan Communicate to MPT of need for D/C summary   PT Home Exercise Plan levator and UT stretch,  cervical retraction   Consulted and Agree with Plan of Care Patient;Other (Comment)  Interpreter        Problem List Patient Active Problem List   Diagnosis Date Noted  . Essential hypertension, benign 07/23/2012  . BPH (benign prostatic hyperplasia) 07/23/2012  . Cervicalgia 07/23/2012  . Hyperlipidemia with target LDL less than 100 07/23/2012    Ahmed Prima, PTA 03/05/2015 10:38 AM  [facility] [place], [place], [address] Phone: [phone]   Fax:  [phone]  Name: Darren Rose MRN: [id number] Date of Birth: 12/28/1931

## 2015-03-06 LAB — CMP14+EGFR
ALK PHOS: 88 IU/L (ref 39–117)
ALT: 23 IU/L (ref 0–44)
AST: 24 IU/L (ref 0–40)
Albumin/Globulin Ratio: 1.7 (ref 1.1–2.5)
Albumin: 4.3 g/dL (ref 3.5–4.7)
BUN/Creatinine Ratio: 25 — ABNORMAL HIGH (ref 10–22)
BUN: 19 mg/dL (ref 8–27)
Bilirubin Total: 0.4 mg/dL (ref 0.0–1.2)
CALCIUM: 9.5 mg/dL (ref 8.6–10.2)
CO2: 27 mmol/L (ref 18–29)
CREATININE: 0.77 mg/dL (ref 0.76–1.27)
Chloride: 104 mmol/L (ref 96–106)
GFR calc Af Amer: 98 mL/min/{1.73_m2} (ref >59)
GFR, EST NON AFRICAN AMERICAN: 85 mL/min/{1.73_m2} (ref >59)
GLUCOSE: 104 mg/dL — AB (ref 65–99)
Globulin, Total: 2.5 g/dL (ref 1.5–4.5)
Potassium: 4.6 mmol/L (ref 3.5–5.2)
SODIUM: 147 mmol/L — AB (ref 134–144)
Total Protein: 6.8 g/dL (ref 6.0–8.5)

## 2015-03-06 LAB — LIPID PANEL
CHOLESTEROL TOTAL: 123 mg/dL (ref 100–199)
Chol/HDL Ratio: 2.3 ratio units (ref 0.0–5.0)
HDL: 53 mg/dL (ref >39)
LDL CALC: 60 mg/dL (ref 0–99)
TRIGLYCERIDES: 48 mg/dL (ref 0–149)
VLDL CHOLESTEROL CAL: 10 mg/dL (ref 5–40)

## 2015-03-06 LAB — PSA, TOTAL AND FREE
PSA FREE: 0.7 ng/mL
PSA, Free Pct: 31.8 %
Prostate Specific Ag, Serum: 2.2 ng/mL (ref 0.0–4.0)

## 2015-03-06 NOTE — Therapy (Signed)
[facility] [place], [place], [address] Phone: [phone]   Fax:  [phone]  Physical Therapy Treatment  Patient Details  Name: Darren Rose MRN: [id number] Date of Birth: 05/21/1931 Referring Provider: Jeri Cos PA-C  Encounter Date: 03/05/2015      PT End of Session - 03/05/15 0859    Visit Number 6   Number of Visits 12   Date for PT Re-Evaluation 03/02/15   PT Start Time 0900   PT Stop Time 0944   PT Time Calculation (min) 44 min   Activity Tolerance Patient tolerated treatment well   Behavior During Therapy [facility] for tasks assessed/performed      Past Medical History  Diagnosis Date  . Hypertension   . Hyperlipidemia   . BPH (benign prostatic hypertrophy)     Past Surgical History  Procedure Laterality Date  . Finger surgery      There were no vitals filed for this visit.  Visit Diagnosis:  Neck pain  Pain of both shoulder joints      Subjective Assessment - 03/05/15 0859    Subjective States that his neck is much better. Reports a burning sensation in L cervical musculature last time he drove to [place]. Reports he will be gone to [place] for 2 months and may be done with PT or call back once he gets back.   Patient is accompained by: Interpreter   Patient Stated Goals Have neck feel better.   Currently in Pain? No/denies                                   PT Short Term Goals - 02/05/15 1050    PT SHORT TERM GOAL #1   Title Ind with HEP.   Time 2   Period Weeks   Status Achieved           PT Long Term Goals - 03/05/15 0930    PT LONG TERM GOAL #1   Title Bilateral active cervical rotation= 65-70 degrees.   Time 6   Period Weeks   Status Achieved   PT LONG TERM GOAL #2   Title Perform ADL's with pain not > 3/10.   Time 6   Period Weeks   Status Achieved   PT LONG TERM GOAL #3   Title Sleep 6 hours undisturbed.   Time 6   Period Weeks   Status  Deferred  pt reports difficulty sleeping not due to neck pain               Plan - 03/05/15 1032    Clinical Impression Statement Patient tolerated today's treatment very well and reported experiencing a great improvement. Normal [place] response noted following end of the [place] session. Tightness noted in L medial UT/ Levator Scapula had greatly decreased since previous treatment although manual therapy was continued today to further decrease the tightness and following the fact that he will be gone to [place] for 2 months. Patient and interpreter were both educated to continue both HEPs given in previous treatments as to decrease pain or tension he may experience especially with driving as he had reported. Patient has achieved all goals set at evaluation other than sleep goal which was deferred previously.   Pt will benefit from skilled therapeutic intervention in order to improve on the following deficits Pain;Decreased activity tolerance;Decreased range of motion   Rehab Potential Excellent   PT Frequency  2x / week   PT Duration 6 weeks   PT Treatment/Interventions ADLs/Self Care Home Management;Moist Heat;Electrical Stimulation;Ultrasound;Therapeutic activities;Therapeutic exercise;Patient/family education;Manual techniques;Passive range of motion   PT Next Visit Plan Communicate to MPT of need for D/C summary   PT Home Exercise Plan levator and UT stretch, cervical retraction   Consulted and Agree with Plan of Care Patient;Other (Comment)  Interpreter          G-Codes - 03/23/15 1617    Functional Assessment Tool Used FOTO.   Functional Limitation Other PT primary   Other PT Primary Current Status (F0071) At least 20 percent but less than 40 percent impaired, limited or restricted   Other PT Primary Goal Status (Q1975) At least 1 percent but less than 20 percent impaired, limited or restricted   Other PT Primary Discharge Status [phone]) At least 20 percent but less than 40 percent  impaired, limited or restricted      Problem List Patient Active Problem List   Diagnosis Date Noted  . Essential hypertension, benign 07/23/2012  . BPH (benign prostatic hyperplasia) 07/23/2012  . Cervicalgia 07/23/2012  . Hyperlipidemia with target LDL less than 100 07/23/2012   PHYSICAL THERAPY DISCHARGE SUMMARY  Visits from Start of Care: 6  Current functional level related to goals / functional outcomes: Please see above.   Remaining deficits: Pain during sleeping.   Education / Equipment: HEP.  Plan: Patient agrees to discharge.  Patient goals were partially met. Patient is being discharged due to meeting the stated rehab goals.  ?????      Anup Brigham, [place] MPT 03/23/15, 4:18 PM  [facility] [address] [place], [place], [address] Phone: [phone]   Fax:  [phone]  Name: Darren Rose MRN: [id number] Date of Birth: [birth year]

## 2015-03-08 LAB — POCT GLYCOSYLATED HEMOGLOBIN (HGB A1C): Hemoglobin A1C: 6

## 2015-03-08 NOTE — Addendum Note (Signed)
Addended by: Pollyann Kennedy F on: 03/08/2015 03:55 PM   Modules accepted: Orders, SmartSet

## 2015-03-08 NOTE — Addendum Note (Signed)
Addended by: Pollyann Kennedy F on: 03/08/2015 04:18 PM   Modules accepted: Orders

## 2015-03-09 NOTE — Progress Notes (Signed)
Patient aware.

## 2015-03-19 ENCOUNTER — Ambulatory Visit: Payer: Medicare Other | Admitting: Pharmacist

## 2015-03-31 ENCOUNTER — Ambulatory Visit (INDEPENDENT_AMBULATORY_CARE_PROVIDER_SITE_OTHER): Payer: Medicare Other | Admitting: Urology

## 2015-03-31 DIAGNOSIS — N401 Enlarged prostate with lower urinary tract symptoms: Secondary | ICD-10-CM

## 2015-03-31 DIAGNOSIS — R3915 Urgency of urination: Secondary | ICD-10-CM

## 2015-03-31 DIAGNOSIS — R351 Nocturia: Secondary | ICD-10-CM | POA: Diagnosis not present

## 2015-05-22 ENCOUNTER — Other Ambulatory Visit: Payer: Self-pay | Admitting: Family Medicine

## 2015-06-14 ENCOUNTER — Ambulatory Visit (INDEPENDENT_AMBULATORY_CARE_PROVIDER_SITE_OTHER): Payer: Medicare Other | Admitting: Family Medicine

## 2015-06-14 ENCOUNTER — Encounter: Payer: Self-pay | Admitting: Family Medicine

## 2015-06-14 VITALS — BP 138/85 | HR 68 | Temp 97.9°F | Ht 64.0 in | Wt 132.0 lb

## 2015-06-14 DIAGNOSIS — J209 Acute bronchitis, unspecified: Secondary | ICD-10-CM | POA: Diagnosis not present

## 2015-06-14 MED ORDER — AZITHROMYCIN 250 MG PO TABS: ORAL_TABLET | ORAL | Status: DC

## 2015-06-14 NOTE — Progress Notes (Signed)
BP 138/85 mmHg  Pulse 68  Temp(Src) 97.9 F (36.6 C) (Oral)  Ht 5\' 4"  (1.626 m)  Wt 132 lb (59.875 kg)  BMI 22.65 kg/m2  SpO2 95%   Subjective:    Patient ID: Darren Rose, male    DOB: 1931-12-15, 82 y.o.   MRN: [id number]  HPI: Darren Rose is a 82 y.o. male presenting on 06/14/2015 for Cough; Chest congestion; and Headache   HPI Cough and chest congestion Patient is coming in for cough and chest congestion. He has been having this cough has been going on for about a week. His cough is nonproductive. He denies any shortness of breath or wheezing or fevers or chills. He has been using NyQuil but does not seem to be helping. He denies any sick contacts that he knows of.  Relevant past medical, surgical, family and social history reviewed and updated as indicated. Interim medical history since our last visit reviewed. Allergies and medications reviewed and updated.  Review of Systems  Constitutional: Negative for fever and chills.  HENT: Positive for congestion, postnasal drip, rhinorrhea, sinus pressure, sneezing and sore throat. Negative for ear discharge, ear pain and voice change.   Eyes: Negative for pain, discharge, redness and visual disturbance.  Respiratory: Positive for cough. Negative for chest tightness, shortness of breath and wheezing.   Cardiovascular: Negative for chest pain and leg swelling.  Gastrointestinal: Negative for abdominal pain, diarrhea and constipation.  Genitourinary: Negative for difficulty urinating.  Musculoskeletal: Negative for back pain and gait problem.  Skin: Negative for rash.  Neurological: Negative for syncope, light-headedness and headaches.  All other systems reviewed and are negative.   Per HPI unless specifically indicated above     Medication List       This list is accurate as of: 06/14/15  2:22 PM.  Always use your most recent med list.               atorvastatin 40 MG tablet  Commonly known as:  LIPITOR    TAKE 1 TABLET (40 MG TOTAL) BY MOUTH DAILY AT 6 PM.     azithromycin 250 MG tablet  Commonly known as:  ZITHROMAX  Take 2 the first day and then one each day after.     docusate sodium 100 MG capsule  Commonly known as:  COLACE  Take 1 capsule (100 mg total) by mouth 2 (two) times daily.     finasteride 5 MG tablet  Commonly known as:  PROSCAR  Take 5 mg by mouth daily.     hydrochlorothiazide 25 MG tablet  Commonly known as:  HYDRODIURIL  TAKE 1 TABLET (25 MG TOTAL) BY MOUTH DAILY.     lisinopril 40 MG tablet  Commonly known as:  PRINIVIL,ZESTRIL  TAKE 1 TABLET (40 MG TOTAL) BY MOUTH DAILY.     meloxicam 7.5 MG tablet  Commonly known as:  MOBIC  TOME UNA TABLETA TODOS LOS DIAS     tamsulosin 0.4 MG Caps capsule  Commonly known as:  FLOMAX  Take 1 capsule (0.4 mg total) by mouth daily.           Objective:    BP 138/85 mmHg  Pulse 68  Temp(Src) 97.9 F (36.6 C) (Oral)  Ht 5\' 4"  (1.626 m)  Wt 132 lb (59.875 kg)  BMI 22.65 kg/m2  SpO2 95%  Wt Readings from Last 3 Encounters:  06/14/15 132 lb (59.875 kg)  03/05/15 132 lb 12.8 oz (60.238 kg)  10/16/14 134 lb (60.782  kg)    Physical Exam  Constitutional: He is oriented to person, place, and time. He appears well-developed and well-nourished. No distress.  HENT:  Right Ear: Tympanic membrane, external ear and ear canal normal.  Left Ear: Tympanic membrane, external ear and ear canal normal.  Nose: Mucosal edema and rhinorrhea present. No sinus tenderness. No epistaxis. Right sinus exhibits maxillary sinus tenderness. Right sinus exhibits no frontal sinus tenderness. Left sinus exhibits maxillary sinus tenderness. Left sinus exhibits no frontal sinus tenderness.  Mouth/Throat: Uvula is midline and mucous membranes are normal. Posterior oropharyngeal edema and posterior oropharyngeal erythema present. No oropharyngeal exudate or tonsillar abscesses.  Eyes: Conjunctivae and EOM are normal. Pupils are equal, round, and  reactive to light. Right eye exhibits no discharge. No scleral icterus.  Neck: Neck supple. No thyromegaly present.  Cardiovascular: Normal rate, regular rhythm, normal heart sounds and intact distal pulses.   No murmur heard. Pulmonary/Chest: Effort normal and breath sounds normal. No respiratory distress. He has no wheezes. He has no rales.  Musculoskeletal: Normal range of motion. He exhibits no edema.  Lymphadenopathy:    He has no cervical adenopathy.  Neurological: He is alert and oriented to person, place, and time. Coordination normal.  Skin: Skin is warm and dry. No rash noted. He is not diaphoretic.  Psychiatric: He has a normal mood and affect. His behavior is normal.  Nursing note and vitals reviewed.     Assessment & Plan:   Problem List Items Addressed This Visit    None    Visit Diagnoses    Acute bronchitis, unspecified organism    -  Primary    Relevant Medications    azithromycin (ZITHROMAX) 250 MG tablet    Other Relevant Orders    EKG 12-Lead        Follow up plan: Return if symptoms worsen or fail to improve.  Counseling provided for all of the vaccine components No orders of the defined types were placed in this encounter.    Darren Pina, MD [facility] 06/14/2015, 2:22 PM

## 2015-07-07 ENCOUNTER — Ambulatory Visit: Payer: Medicare Other | Admitting: Urology

## 2015-09-18 ENCOUNTER — Other Ambulatory Visit: Payer: Self-pay | Admitting: Family Medicine

## 2015-09-21 DIAGNOSIS — H2511 Age-related nuclear cataract, right eye: Secondary | ICD-10-CM | POA: Diagnosis not present

## 2015-09-21 DIAGNOSIS — H2513 Age-related nuclear cataract, bilateral: Secondary | ICD-10-CM | POA: Diagnosis not present

## 2015-10-07 NOTE — Patient Instructions (Addendum)
Your procedure is scheduled on:  10/12/2015               Report to [facility] at    8:30 AM.  Call this number if you have problems the morning of surgery: [phone]   Remember:   Do not eat or drink :After Midnight.    Take these medicines the morning of surgery with A SIP OF WATER:     lisinopril       Do not wear jewelry, make-up or nail polish.  Do not wear lotions, powders, or perfumes. You may wear deodorant.  Do not bring valuables to the hospital.  Contacts, dentures or bridgework may not be worn into surgery.  Patients discharged the day of surgery will not be allowed to drive home.  Name and phone number of your driver:    @[email or web address] @ Cataract Surgery  A cataract is a clouding of the lens of the eye. When a lens becomes cloudy, vision is reduced based on the degree and nature of the clouding. Surgery may be needed to improve vision. Surgery removes the cloudy lens and usually replaces it with a substitute lens (intraocular lens, IOL). LET YOUR EYE DOCTOR KNOW ABOUT:  Allergies to food or medicine.   Medicines taken including herbs, eyedrops, over-the-counter medicines, and creams.   Use of steroids (by mouth or creams).   Previous problems with anesthetics or numbing medicine.   History of bleeding problems or blood clots.   Previous surgery.   Other health problems, including diabetes and kidney problems.   Possibility of pregnancy, if this applies.  RISKS AND COMPLICATIONS  Infection.   Inflammation of the eyeball (endophthalmitis) that can spread to both eyes (sympathetic ophthalmia).   Poor wound healing.   If an IOL is inserted, it can later fall out of proper position. This is very uncommon.   Clouding of the part of your eye that holds an IOL in place. This is called an "after-cataract." These are uncommon, but easily treated.  BEFORE THE PROCEDURE  Do not eat or drink anything except small amounts of water for 8 to 12 before your  surgery, or as directed by your caregiver.   Unless you are told otherwise, continue any eyedrops you have been prescribed.   Talk to your primary caregiver about all other medicines that you take (both prescription and non-prescription). In some cases, you may need to stop or change medicines near the time of your surgery. This is most important if you are taking blood-thinning medicine.Do not stop medicines unless you are told to do so.   Arrange for someone to drive you to and from the procedure.   Do not put contact lenses in either eye on the day of your surgery.  PROCEDURE There is more than one method for safely removing a cataract. Your doctor can explain the differences and help determine which is best for you. Phacoemulsification surgery is the most common form of cataract surgery.  An injection is given behind the eye or eyedrops are given to make this a painless procedure.   A small cut (incision) is made on the edge of the clear, dome-shaped surface that covers the front of the eye (cornea).   A tiny probe is painlessly inserted into the eye. This device gives off ultrasound waves that soften and break up the cloudy center of the lens. This makes it easier for the cloudy lens to be removed by suction.   An IOL may be implanted.  The normal lens of the eye is covered by a clear capsule. Part of that capsule is intentionally left in the eye to support the IOL.   Your surgeon may or may not use stitches to close the incision.  There are other forms of cataract surgery that require a larger incision and stiches to close the eye. This approach is taken in cases where the doctor feels that the cataract cannot be easily removed using phacoemulsification. AFTER THE PROCEDURE  When an IOL is implanted, it does not need care. It becomes a permanent part of your eye and cannot be seen or felt.   Your doctor will schedule follow-up exams to check on your progress.   Review your  other medicines with your doctor to see which can be resumed after surgery.   Use eyedrops or take medicine as prescribed by your doctor.  Document Released: 01/05/2011 Document Reviewed: 01/02/2011 [facility] Patient Information 2012 [place].  .Cataract Surgery Care After Refer to this sheet in the next few weeks. These instructions provide you with information on caring for yourself after your procedure. Your caregiver may also give you more specific instructions. Your treatment has been planned according to current medical practices, but problems sometimes occur. Call your caregiver if you have any problems or questions after your procedure.  HOME CARE INSTRUCTIONS   Avoid strenuous activities as directed by your caregiver.   Ask your caregiver when you can resume driving.   Use eyedrops or other medicines to help healing and control pressure inside your eye as directed by your caregiver.   Only take over-the-counter or prescription medicines for pain, discomfort, or fever as directed by your caregiver.   Do not to touch or rub your eyes.   You may be instructed to use a protective shield during the first few days and nights after surgery. If not, wear sunglasses to protect your eyes. This is to protect the eye from pressure or from being accidentally bumped.   Keep the area around your eye clean and dry. Avoid swimming or allowing water to hit you directly in the face while showering. Keep soap and shampoo out of your eyes.   Do not bend or lift heavy objects. Bending increases pressure in the eye. You can walk, climb stairs, and do light household chores.   Do not put a contact lens into the eye that had surgery until your caregiver says it is okay to do so.   Ask your doctor when you can return to work. This will depend on the kind of work that you do. If you work in a dusty environment, you may be advised to wear protective eyewear for a period of time.   Ask your  caregiver when it will be safe to engage in sexual activity.   Continue with your regular eye exams as directed by your caregiver.  What to expect:  It is normal to feel itching and mild discomfort for a few days after cataract surgery. Some fluid discharge is also common, and your eye may be sensitive to light and touch.   After 1 to 2 days, even moderate discomfort should disappear. In most cases, healing will take about 6 weeks.   If you received an intraocular lens (IOL), you may notice that colors are very bright or have a blue tinge. Also, if you have been in bright sunlight, everything may appear reddish for a few hours. If you see these color tinges, it is because your lens is  clear and no longer cloudy. Within a few months after receiving an IOL, these extra colors should go away. When you have healed, you will probably need new glasses.  SEEK MEDICAL CARE IF:   You have increased bruising around your eye.   You have discomfort not helped by medicine.  SEEK IMMEDIATE MEDICAL CARE IF:   You have a fever.   You have a worsening or sudden vision loss.   You have redness, swelling, or increasing pain in the eye.   You have a thick discharge from the eye that had surgery.  MAKE SURE YOU:  Understand these instructions.   Will watch your condition.   Will get help right away if you are not doing well or get worse.  Document Released: 08/05/2004 Document Revised: 01/05/2011 Document Reviewed: 09/09/2010 [facility] Patient Information 2012 [place].    Monitored Anesthesia Care  Monitored anesthesia care is an anesthesia service for a medical procedure. Anesthesia is the loss of the ability to feel pain. It is produced by medications called anesthetics. It may affect a small area of your body (local anesthesia), a large area of your body (regional anesthesia), or your entire body (general anesthesia). The need for monitored anesthesia care depends your procedure, your  condition, and the potential need for regional or general anesthesia. It is often provided during procedures where:   General anesthesia may be needed if there are complications. This is because you need special care when you are under general anesthesia.   You will be under local or regional anesthesia. This is so that you are able to have higher levels of anesthesia if needed.   You will receive calming medications (sedatives). This is especially the case if sedatives are given to put you in a semi-conscious state of relaxation (deep sedation). This is because the amount of sedative needed to produce this state can be hard to predict. Too much of a sedative can produce general anesthesia. Monitored anesthesia care is performed by one or more caregivers who have special training in all types of anesthesia. You will need to meet with these caregivers before your procedure. During this meeting, they will ask you about your medical history. They will also give you instructions to follow. (For example, you will need to stop eating and drinking before your procedure. You may also need to stop or change medications you are taking.) During your procedure, your caregivers will stay with you. They will:   Watch your condition. This includes watching you blood pressure, breathing, and level of pain.   Diagnose and treat problems that occur.   Give medications if they are needed. These may include calming medications (sedatives) and anesthetics.   Make sure you are comfortable.  Having monitored anesthesia care does not necessarily mean that you will be under anesthesia. It does mean that your caregivers will be able to manage anesthesia if you need it or if it occurs. It also means that you will be able to have a different type of anesthesia than you are having if you need it. When your procedure is complete, your caregivers will continue to watch your condition. They will make sure any medications wear  off before you are allowed to go home.  Document Released: 10/12/2004 Document Revised: 05/13/2012 Document Reviewed: 02/28/2012 [facility] Patient Information 2014 [place], [place].

## 2015-10-08 ENCOUNTER — Other Ambulatory Visit: Payer: Self-pay

## 2015-10-08 ENCOUNTER — Encounter (HOSPITAL_COMMUNITY): Payer: Self-pay

## 2015-10-08 ENCOUNTER — Encounter (HOSPITAL_COMMUNITY)
Admission: RE | Admit: 2015-10-08 | Discharge: 2015-10-08 | Disposition: A | Discharge status: Home / Self Care | Payer: Medicare Other | Source: Ambulatory Visit | Attending: Ophthalmology | Admitting: Ophthalmology

## 2015-10-08 DIAGNOSIS — R001 Bradycardia, unspecified: Secondary | ICD-10-CM | POA: Insufficient documentation

## 2015-10-08 DIAGNOSIS — I10 Essential (primary) hypertension: Secondary | ICD-10-CM | POA: Diagnosis not present

## 2015-10-08 DIAGNOSIS — N400 Enlarged prostate without lower urinary tract symptoms: Secondary | ICD-10-CM | POA: Insufficient documentation

## 2015-10-08 DIAGNOSIS — M542 Cervicalgia: Secondary | ICD-10-CM | POA: Diagnosis not present

## 2015-10-08 DIAGNOSIS — Z01812 Encounter for preprocedural laboratory examination: Secondary | ICD-10-CM | POA: Insufficient documentation

## 2015-10-08 DIAGNOSIS — Z01810 Encounter for preprocedural cardiovascular examination: Secondary | ICD-10-CM | POA: Insufficient documentation

## 2015-10-08 DIAGNOSIS — E785 Hyperlipidemia, unspecified: Secondary | ICD-10-CM | POA: Diagnosis not present

## 2015-10-08 DIAGNOSIS — H269 Unspecified cataract: Secondary | ICD-10-CM | POA: Insufficient documentation

## 2015-10-08 LAB — BASIC METABOLIC PANEL
ANION GAP: 2 — AB (ref 5–15)
BUN: 26 mg/dL — ABNORMAL HIGH (ref 6–20)
CHLORIDE: 105 mmol/L (ref 101–111)
CO2: 28 mmol/L (ref 22–32)
CREATININE: 0.64 mg/dL (ref 0.61–1.24)
Calcium: 8.3 mg/dL — ABNORMAL LOW (ref 8.9–10.3)
GFR calc Af Amer: >60 mL/min (ref >60)
GFR calc non Af Amer: >60 mL/min (ref >60)
GLUCOSE: 101 mg/dL — AB (ref 65–99)
Potassium: 3.3 mmol/L — ABNORMAL LOW (ref 3.5–5.1)
Sodium: 135 mmol/L (ref 135–145)

## 2015-10-08 LAB — CBC
HEMATOCRIT: 40.1 % (ref 39.0–52.0)
HEMOGLOBIN: 13.1 g/dL (ref 13.0–17.0)
MCH: 31 pg (ref 26.0–34.0)
MCHC: 32.7 g/dL (ref 30.0–36.0)
MCV: 94.8 fL (ref 78.0–100.0)
Platelets: 191 10*3/uL (ref 150–400)
RBC: 4.23 MIL/uL (ref 4.22–5.81)
RDW: 13.1 % (ref 11.5–15.5)
WBC: 7.3 10*3/uL (ref 4.0–10.5)

## 2015-10-12 ENCOUNTER — Encounter (HOSPITAL_COMMUNITY)
Admission: RE | Disposition: A | Discharge status: Home / Self Care | Payer: Self-pay | Source: Ambulatory Visit | Attending: Ophthalmology

## 2015-10-12 ENCOUNTER — Encounter (HOSPITAL_COMMUNITY): Payer: Self-pay | Admitting: *Deleted

## 2015-10-12 ENCOUNTER — Ambulatory Visit (HOSPITAL_COMMUNITY)
Admission: RE | Admit: 2015-10-12 | Discharge: 2015-10-12 | Disposition: A | Discharge status: Home / Self Care | Payer: Medicare Other | Source: Ambulatory Visit | Attending: Ophthalmology | Admitting: Ophthalmology

## 2015-10-12 ENCOUNTER — Ambulatory Visit (HOSPITAL_COMMUNITY): Payer: Medicare Other | Admitting: Anesthesiology

## 2015-10-12 DIAGNOSIS — M1990 Unspecified osteoarthritis, unspecified site: Secondary | ICD-10-CM | POA: Insufficient documentation

## 2015-10-12 DIAGNOSIS — Z791 Long term (current) use of non-steroidal anti-inflammatories (NSAID): Secondary | ICD-10-CM | POA: Diagnosis not present

## 2015-10-12 DIAGNOSIS — Z79899 Other long term (current) drug therapy: Secondary | ICD-10-CM | POA: Diagnosis not present

## 2015-10-12 DIAGNOSIS — H2512 Age-related nuclear cataract, left eye: Secondary | ICD-10-CM | POA: Diagnosis not present

## 2015-10-12 DIAGNOSIS — I10 Essential (primary) hypertension: Secondary | ICD-10-CM | POA: Insufficient documentation

## 2015-10-12 DIAGNOSIS — H269 Unspecified cataract: Secondary | ICD-10-CM | POA: Insufficient documentation

## 2015-10-12 DIAGNOSIS — H2511 Age-related nuclear cataract, right eye: Secondary | ICD-10-CM | POA: Diagnosis not present

## 2015-10-12 HISTORY — PX: CATARACT EXTRACTION W/PHACO: SHX586

## 2015-10-12 SURGERY — PHACOEMULSIFICATION, CATARACT, WITH IOL INSERTION
Anesthesia: Monitor Anesthesia Care | Site: Eye | Laterality: Right

## 2015-10-12 MED ORDER — MIDAZOLAM HCL 2 MG/2ML IJ SOLN
INTRAMUSCULAR | Status: AC
  Filled 2015-10-12: qty 2

## 2015-10-12 MED ORDER — PROVISC 10 MG/ML IO SOLN
INTRAOCULAR | Status: DC | PRN
  Administered 2015-10-12: 0.85 mL via INTRAOCULAR

## 2015-10-12 MED ORDER — TETRACAINE HCL 0.5 % OP SOLN
1.0000 [drp] | OPHTHALMIC | Status: AC
  Administered 2015-10-12 (×3): 1 [drp] via OPHTHALMIC

## 2015-10-12 MED ORDER — EPINEPHRINE HCL 1 MG/ML IJ SOLN
INTRAOCULAR | Status: DC | PRN
  Administered 2015-10-12: 500 mL

## 2015-10-12 MED ORDER — LACTATED RINGERS IV SOLN
INTRAVENOUS | Status: DC | PRN
  Administered 2015-10-12: 09:00:00 via INTRAVENOUS

## 2015-10-12 MED ORDER — BSS IO SOLN
INTRAOCULAR | Status: DC | PRN
  Administered 2015-10-12: 15 mL

## 2015-10-12 MED ORDER — FENTANYL CITRATE (PF) 100 MCG/2ML IJ SOLN
INTRAMUSCULAR | Status: AC
  Filled 2015-10-12: qty 2

## 2015-10-12 MED ORDER — MIDAZOLAM HCL 2 MG/2ML IJ SOLN
2.0000 mg | Freq: Once | INTRAMUSCULAR | Status: AC
  Administered 2015-10-12: 2 mg via INTRAVENOUS

## 2015-10-12 MED ORDER — PHENYLEPHRINE HCL 2.5 % OP SOLN
1.0000 [drp] | OPHTHALMIC | Status: AC
  Administered 2015-10-12 (×3): 1 [drp] via OPHTHALMIC

## 2015-10-12 MED ORDER — GLYCOPYRROLATE 0.2 MG/ML IJ SOLN
INTRAMUSCULAR | Status: DC | PRN
  Administered 2015-10-12: 0.2 mg via INTRAVENOUS

## 2015-10-12 MED ORDER — LACTATED RINGERS IV SOLN
Freq: Once | INTRAVENOUS | Status: AC
  Administered 2015-10-12: 09:00:00 via INTRAVENOUS

## 2015-10-12 MED ORDER — EPINEPHRINE HCL 1 MG/ML IJ SOLN
INTRAMUSCULAR | Status: AC
  Filled 2015-10-12: qty 1

## 2015-10-12 MED ORDER — GLYCOPYRROLATE 0.2 MG/ML IJ SOLN
INTRAMUSCULAR | Status: AC
  Filled 2015-10-12: qty 1

## 2015-10-12 MED ORDER — FENTANYL CITRATE (PF) 100 MCG/2ML IJ SOLN
25.0000 ug | Freq: Once | INTRAMUSCULAR | Status: AC
  Administered 2015-10-12: 25 ug via INTRAVENOUS

## 2015-10-12 MED ORDER — CYCLOPENTOLATE-PHENYLEPHRINE 0.2-1 % OP SOLN
1.0000 [drp] | OPHTHALMIC | Status: AC
  Administered 2015-10-12 (×3): 1 [drp] via OPHTHALMIC

## 2015-10-12 MED ORDER — TETRACAINE 0.5 % OP SOLN OPTIME - NO CHARGE
OPHTHALMIC | Status: DC | PRN
  Administered 2015-10-12: 2 [drp] via OPHTHALMIC

## 2015-10-12 MED ORDER — KETOROLAC TROMETHAMINE 0.5 % OP SOLN
1.0000 [drp] | OPHTHALMIC | Status: AC
  Administered 2015-10-12 (×3): 1 [drp] via OPHTHALMIC

## 2015-10-12 SURGICAL SUPPLY — 9 items
CLOTH BEACON ORANGE TIMEOUT ST (SAFETY) ×3 IMPLANT
EYE SHIELD UNIVERSAL CLEAR (GAUZE/BANDAGES/DRESSINGS) ×3 IMPLANT
GLOVE BIOGEL PI IND STRL 7.0 (GLOVE) ×1 IMPLANT
GLOVE BIOGEL PI INDICATOR 7.0 (GLOVE) ×2
LENS ALC ACRYL/TECN (Ophthalmic Related) ×3 IMPLANT
PAD ARMBOARD 7.5X6 YLW CONV (MISCELLANEOUS) ×3 IMPLANT
TAPE SURG TRANSPORE 1 IN (GAUZE/BANDAGES/DRESSINGS) ×1 IMPLANT
TAPE SURGICAL TRANSPORE 1 IN (GAUZE/BANDAGES/DRESSINGS) ×2
WATER STERILE IRR 250ML POUR (IV SOLUTION) ×3 IMPLANT

## 2015-10-12 NOTE — Anesthesia Postprocedure Evaluation (Signed)
Anesthesia Post Note  Patient: Darren Rose  Procedure(s) Performed: Procedure(s) (LRB): CATARACT EXTRACTION PHACO AND INTRAOCULAR LENS PLACEMENT RIGHT EYE (Right)  Patient location during evaluation: Short Stay Anesthesia Type: MAC Level of consciousness: awake and alert Pain management: pain level controlled Vital Signs Assessment: post-procedure vital signs reviewed and stable Respiratory status: spontaneous breathing Cardiovascular status: stable Anesthetic complications: no    Last Vitals:  Vitals:   10/12/15 0814 10/12/15 0942  BP:  (!) 155/72  Pulse: (!) 39 (!) 43  Resp: (!) 28 20  Temp: 36.6 C 36.4 C    Last Pain:  Vitals:   10/12/15 0942  TempSrc: Oral                 [name]

## 2015-10-12 NOTE — Anesthesia Preprocedure Evaluation (Signed)
Anesthesia Evaluation  Patient identified by MRN, date of birth, ID band Patient awake    Reviewed: Allergy & Precautions, H&P , NPO status , Patient's Chart, lab work & pertinent test results  Airway Mallampati: II   Neck ROM: full    Dental   Pulmonary neg pulmonary ROS,    breath sounds clear to auscultation       Cardiovascular hypertension,  Rhythm:regular Rate:Normal     Neuro/Psych    GI/Hepatic   Endo/Other    Renal/GU      Musculoskeletal   Abdominal   Peds  Hematology   Anesthesia Other Findings   Reproductive/Obstetrics                             Anesthesia Physical Anesthesia Plan  ASA: II  Anesthesia Plan: MAC   Post-op Pain Management:    Induction: Intravenous  Airway Management Planned: Nasal Cannula  Additional Equipment:   Intra-op Plan:   Post-operative Plan:   Informed Consent: I have reviewed the patients History and Physical, chart, labs and discussed the procedure including the risks, benefits and alternatives for the proposed anesthesia with the patient or authorized representative who has indicated his/her understanding and acceptance.     Plan Discussed with: CRNA, Anesthesiologist and Surgeon  Anesthesia Plan Comments:         Anesthesia Quick Evaluation

## 2015-10-12 NOTE — H&P (Signed)
The patient was re examined and there is no change in the patients condition since the original H and P. 

## 2015-10-12 NOTE — Discharge Instructions (Signed)
°  °          [facility] Instructions °[address]- [place] [address] °    ° °1. Avoid closing eyes tightly. One often closes the eye tightly when laughing, talking, sneezing, coughing or if they feel irritated. At these times, you should be careful not to close your eyes tightly. ° °2. Instill eye drops as instructed. To instill drops in your eye, open it, look up and have someone gently pull the lower lid down and instill a couple of drops inside the lower lid. ° °3. Do not touch upper lid. ° °4. Take Advil or Tylenol for pain. ° °5. You may use either eye for near work, such as reading or sewing and you may watch television. ° °6. You may have your hair done at the beauty parlor at any time. ° °7. Wear dark glasses with or without your own glasses if you are in bright light. ° °8. Call our office at [phone] or [phone] if you have sharp pain in your eye or unusual symptoms. ° °9.  FOLLOW UP WITH DR. SHAPIRO TODAY IN HIS [place] OFFICE AT 2:45pm. ° °  °I have received a copy of the above instructions and will follow them.  ° ° ° °IF YOU ARE IN IMMEDIATE DANGER CALL 911! ° °It is important for you to keep your follow-up appointment with your physician after discharge, OR, for you /your caregiver to make a follow-up appointment with your physician / medical provider after discharge. ° °Show these instructions to the next healthcare provider you see. °PATIENT INSTRUCTIONS °POST-ANESTHESIA ° °IMMEDIATELY FOLLOWING SURGERY:  Do not drive or operate machinery for the first twenty four hours after surgery.  Do not make any important decisions for twenty four hours after surgery or while taking narcotic pain medications or sedatives.  If you develop intractable nausea and vomiting or a severe headache please notify your doctor immediately. ° °FOLLOW-UP:  Please make an appointment with your surgeon as instructed. You do not need to follow up with anesthesia unless  specifically instructed to do so. ° °WOUND CARE INSTRUCTIONS (if applicable):  Keep a dry clean dressing on the anesthesia/puncture wound site if there is drainage.  Once the wound has quit draining you may leave it open to air.  Generally you should leave the bandage intact for twenty four hours unless there is drainage.  If the epidural site drains for more than 36-48 hours please call the anesthesia department. ° °QUESTIONS?:  Please feel free to call your physician or the hospital operator if you have any questions, and they will be happy to assist you.    ° ° ° °

## 2015-10-12 NOTE — Transfer of Care (Signed)
Immediate Anesthesia Transfer of Care Note  Patient: Darren Rose  Procedure(s) Performed: Procedure(s) with comments: CATARACT EXTRACTION PHACO AND INTRAOCULAR LENS PLACEMENT RIGHT EYE (Right) - CDE:8.04  Patient Location: PACU  Anesthesia Type:MAC  Level of Consciousness: awake, alert , oriented and patient cooperative  Airway & Oxygen Therapy: Patient Spontanous Breathing  Post-op Assessment: Report given to RN, Post -op Vital signs reviewed and stable and Patient moving all extremities X 4  Post vital signs: Reviewed and stable  Last Vitals:  Vitals:   10/12/15 0814  Pulse: (!) 39  Resp: (!) 28  Temp: 36.6 C    Last Pain:  Vitals:   10/12/15 0814  TempSrc: Oral         Complications: No apparent anesthesia complications

## 2015-10-12 NOTE — Op Note (Signed)
Patient brought to the operating room and prepped and draped in the usual manner.  Lid speculum inserted in right eye.  Stab incision made at the twelve o'clock position.  Provisc instilled in the anterior chamber.   A 2.4 mm. Stab incision was made temporally.  An anterior capsulotomy was done with a bent 25 gauge needle.  The nucleus was hydrodissected.  The Phaco tip was inserted in the anterior chamber and the nucleus was emulsified.  CDE was 8.04.  The cortical material was then removed with the I and A tip.  Posterior capsule was the polished.  The anterior chamber was deepened with Provisc.  A 20.5 Diopter Alcon [id number] IOL was then inserted in the capsular bag.  Provisc was then removed with the I and A tip.  The wound was then hydrated.  Patient sent to the Recovery Room in good condition with follow up in my office.  Preoperative Diagnosis:  Nuclear Cataract OD Postoperative Diagnosis:  Same Procedure name: Kelman Phacoemulsification OD with IOL

## 2015-10-15 ENCOUNTER — Encounter (HOSPITAL_COMMUNITY): Payer: Self-pay | Admitting: Ophthalmology

## 2015-11-03 ENCOUNTER — Encounter (HOSPITAL_COMMUNITY): Payer: Self-pay

## 2015-11-03 MED ORDER — OXYCODONE HCL 5 MG/5ML PO SOLN: 5.0000 mg | Freq: Once | ORAL | Status: DC | PRN

## 2015-11-03 MED ORDER — ONDANSETRON HCL 4 MG/2ML IJ SOLN: 4.0000 mg | Freq: Four times a day (QID) | INTRAMUSCULAR | Status: DC | PRN

## 2015-11-03 MED ORDER — FENTANYL CITRATE (PF) 100 MCG/2ML IJ SOLN: 25.0000 ug | INTRAMUSCULAR | Status: DC | PRN

## 2015-11-03 MED ORDER — OXYCODONE HCL 5 MG PO TABS: 5.0000 mg | ORAL_TABLET | Freq: Once | ORAL | Status: DC | PRN

## 2015-11-04 ENCOUNTER — Encounter (HOSPITAL_COMMUNITY)
Admission: RE | Admit: 2015-11-04 | Discharge: 2015-11-04 | Disposition: A | Discharge status: Home / Self Care | Payer: Medicare Other | Source: Ambulatory Visit | Attending: Ophthalmology | Admitting: Ophthalmology

## 2015-11-08 MED ORDER — PHENYLEPHRINE HCL 2.5 % OP SOLN
OPHTHALMIC | Status: AC
  Filled 2015-11-08: qty 15

## 2015-11-08 MED ORDER — CYCLOPENTOLATE-PHENYLEPHRINE 0.2-1 % OP SOLN
OPHTHALMIC | Status: AC
  Filled 2015-11-08: qty 2

## 2015-11-08 MED ORDER — TETRACAINE HCL 0.5 % OP SOLN
OPHTHALMIC | Status: AC
  Filled 2015-11-08: qty 4

## 2015-11-08 MED ORDER — KETOROLAC TROMETHAMINE 0.5 % OP SOLN
OPHTHALMIC | Status: AC
  Filled 2015-11-08: qty 5

## 2015-11-09 ENCOUNTER — Ambulatory Visit (HOSPITAL_COMMUNITY): Payer: Medicare Other | Admitting: Anesthesiology

## 2015-11-09 ENCOUNTER — Ambulatory Visit (HOSPITAL_COMMUNITY)
Admission: RE | Admit: 2015-11-09 | Discharge: 2015-11-09 | Disposition: A | Discharge status: Home / Self Care | Payer: Medicare Other | Source: Ambulatory Visit | Attending: Ophthalmology | Admitting: Ophthalmology

## 2015-11-09 ENCOUNTER — Encounter (HOSPITAL_COMMUNITY)
Admission: RE | Disposition: A | Discharge status: Home / Self Care | Payer: Self-pay | Source: Ambulatory Visit | Attending: Ophthalmology

## 2015-11-09 ENCOUNTER — Encounter (HOSPITAL_COMMUNITY): Payer: Self-pay | Admitting: *Deleted

## 2015-11-09 DIAGNOSIS — I10 Essential (primary) hypertension: Secondary | ICD-10-CM | POA: Diagnosis not present

## 2015-11-09 DIAGNOSIS — Z79899 Other long term (current) drug therapy: Secondary | ICD-10-CM | POA: Insufficient documentation

## 2015-11-09 DIAGNOSIS — H2512 Age-related nuclear cataract, left eye: Secondary | ICD-10-CM | POA: Insufficient documentation

## 2015-11-09 HISTORY — PX: CATARACT EXTRACTION W/PHACO: SHX586

## 2015-11-09 SURGERY — PHACOEMULSIFICATION, CATARACT, WITH IOL INSERTION
Anesthesia: Monitor Anesthesia Care | Site: Eye | Laterality: Left

## 2015-11-09 MED ORDER — TETRACAINE HCL 0.5 % OP SOLN
1.0000 [drp] | OPHTHALMIC | Status: AC
  Administered 2015-11-09 (×3): 1 [drp] via OPHTHALMIC

## 2015-11-09 MED ORDER — TETRACAINE 0.5 % OP SOLN OPTIME - NO CHARGE
OPHTHALMIC | Status: DC | PRN
  Administered 2015-11-09: 2 [drp] via OPHTHALMIC

## 2015-11-09 MED ORDER — PHENYLEPHRINE HCL 2.5 % OP SOLN
1.0000 [drp] | OPHTHALMIC | Status: AC
  Administered 2015-11-09 (×3): 1 [drp] via OPHTHALMIC

## 2015-11-09 MED ORDER — LACTATED RINGERS IV SOLN
INTRAVENOUS | Status: DC
  Administered 2015-11-09 (×2): via INTRAVENOUS

## 2015-11-09 MED ORDER — PROVISC 10 MG/ML IO SOLN
INTRAOCULAR | Status: DC | PRN
  Administered 2015-11-09: 0.85 mL via INTRAOCULAR

## 2015-11-09 MED ORDER — CYCLOPENTOLATE-PHENYLEPHRINE 0.2-1 % OP SOLN
1.0000 [drp] | OPHTHALMIC | Status: AC
  Administered 2015-11-09 (×3): 1 [drp] via OPHTHALMIC

## 2015-11-09 MED ORDER — BSS IO SOLN
INTRAOCULAR | Status: DC | PRN
  Administered 2015-11-09: 15 mL

## 2015-11-09 MED ORDER — FENTANYL CITRATE (PF) 100 MCG/2ML IJ SOLN
25.0000 ug | INTRAMUSCULAR | Status: DC | PRN
  Administered 2015-11-09: 25 ug via INTRAVENOUS

## 2015-11-09 MED ORDER — KETOROLAC TROMETHAMINE 0.5 % OP SOLN
1.0000 [drp] | OPHTHALMIC | Status: AC
  Administered 2015-11-09 (×3): 1 [drp] via OPHTHALMIC

## 2015-11-09 MED ORDER — EPINEPHRINE PF 1 MG/ML IJ SOLN
INTRAMUSCULAR | Status: DC | PRN
  Administered 2015-11-09: 500 mL

## 2015-11-09 MED ORDER — MIDAZOLAM HCL 2 MG/2ML IJ SOLN
1.0000 mg | INTRAMUSCULAR | Status: DC | PRN
  Administered 2015-11-09: 2 mg via INTRAVENOUS

## 2015-11-09 MED ORDER — MIDAZOLAM HCL 2 MG/2ML IJ SOLN
INTRAMUSCULAR | Status: AC
  Filled 2015-11-09: qty 2

## 2015-11-09 MED ORDER — FENTANYL CITRATE (PF) 100 MCG/2ML IJ SOLN
INTRAMUSCULAR | Status: AC
  Filled 2015-11-09: qty 2

## 2015-11-09 SURGICAL SUPPLY — 11 items
CLOTH BEACON ORANGE TIMEOUT ST (SAFETY) ×3 IMPLANT
EYE SHIELD UNIVERSAL CLEAR (GAUZE/BANDAGES/DRESSINGS) ×3 IMPLANT
GLOVE BIO SURGEON STRL SZ 6.5 (GLOVE) ×2 IMPLANT
GLOVE BIO SURGEONS STRL SZ 6.5 (GLOVE) ×1
GLOVE EXAM NITRILE MD LF STRL (GLOVE) ×3 IMPLANT
LENS ALC ACRYL/TECN (Ophthalmic Related) ×3 IMPLANT
PAD ARMBOARD 7.5X6 YLW CONV (MISCELLANEOUS) ×3 IMPLANT
SIGHTPATH CAT PROC W REG LENS (Ophthalmic Related) ×3 IMPLANT
TAPE SURG TRANSPORE 1 IN (GAUZE/BANDAGES/DRESSINGS) ×1 IMPLANT
TAPE SURGICAL TRANSPORE 1 IN (GAUZE/BANDAGES/DRESSINGS) ×2
WATER STERILE IRR 250ML POUR (IV SOLUTION) ×3 IMPLANT

## 2015-11-09 NOTE — Discharge Instructions (Signed)
°  °          [facility] Instructions °[address]- [place] [address] °    ° °1. Avoid closing eyes tightly. One often closes the eye tightly when laughing, talking, sneezing, coughing or if they feel irritated. At these times, you should be careful not to close your eyes tightly. ° °2. Instill eye drops as instructed. To instill drops in your eye, open it, look up and have someone gently pull the lower lid down and instill a couple of drops inside the lower lid. ° °3. Do not touch upper lid. ° °4. Take Advil or Tylenol for pain. ° °5. You may use either eye for near work, such as reading or sewing and you may watch television. ° °6. You may have your hair done at the beauty parlor at any time. ° °7. Wear dark glasses with or without your own glasses if you are in bright light. ° °8. Call our office at [phone] or [phone] if you have sharp pain in your eye or unusual symptoms. ° °9.  FOLLOW UP WITH DR. SHAPIRO TODAY IN HIS [place] OFFICE AT 2:45pm. ° °  °I have received a copy of the above instructions and will follow them.  ° ° ° °IF YOU ARE IN IMMEDIATE DANGER CALL 911! ° °It is important for you to keep your follow-up appointment with your physician after discharge, OR, for you /your caregiver to make a follow-up appointment with your physician / medical provider after discharge. ° °Show these instructions to the next healthcare provider you see. ° °

## 2015-11-09 NOTE — Anesthesia Procedure Notes (Signed)
Procedure Name: MAC Date/Time: 11/09/2015 8:04 AM Performed by: Michele Rockers Pre-anesthesia Checklist: Patient identified, Emergency Drugs available, Suction available, Timeout performed and Patient being monitored Patient Re-evaluated:Patient Re-evaluated prior to inductionOxygen Delivery Method: Nasal Cannula

## 2015-11-09 NOTE — Anesthesia Postprocedure Evaluation (Signed)
Anesthesia Post Note  Patient: Darren Rose  Procedure(s) Performed: Procedure(s) (LRB): CATARACT EXTRACTION PHACO AND INTRAOCULAR LENS PLACEMENT (IOC) (Left)  Patient location during evaluation: Short Stay Anesthesia Type: MAC Level of consciousness: awake, awake and alert and oriented Pain management: pain level controlled Vital Signs Assessment: post-procedure vital signs reviewed and stable Respiratory status: spontaneous breathing and respiratory function stable Cardiovascular status: stable Anesthetic complications: no    Last Vitals:  Vitals:   11/09/15 0755 11/09/15 0800  BP: (!) 83/51 (!) 82/48  Resp: 11 11  Temp:      Last Pain:  Vitals:   11/09/15 0725  TempSrc: Oral                 Jennice Renegar

## 2015-11-09 NOTE — Anesthesia Preprocedure Evaluation (Signed)
Anesthesia Evaluation  Patient identified by MRN, date of birth, ID band Patient awake    Reviewed: Allergy & Precautions, H&P , NPO status , Patient's Chart, lab work & pertinent test results  Airway Mallampati: II   Neck ROM: full    Dental   Pulmonary neg pulmonary ROS,    breath sounds clear to auscultation       Cardiovascular hypertension,  Rhythm:regular Rate:Normal     Neuro/Psych    GI/Hepatic   Endo/Other    Renal/GU      Musculoskeletal   Abdominal   Peds  Hematology   Anesthesia Other Findings   Reproductive/Obstetrics                             Anesthesia Physical Anesthesia Plan  ASA: II  Anesthesia Plan: MAC   Post-op Pain Management:    Induction: Intravenous  Airway Management Planned: Nasal Cannula  Additional Equipment:   Intra-op Plan:   Post-operative Plan:   Informed Consent: I have reviewed the patients History and Physical, chart, labs and discussed the procedure including the risks, benefits and alternatives for the proposed anesthesia with the patient or authorized representative who has indicated his/her understanding and acceptance.     Plan Discussed with: CRNA, Anesthesiologist and Surgeon  Anesthesia Plan Comments:         Anesthesia Quick Evaluation

## 2015-11-09 NOTE — H&P (Signed)
The patient was re examined and there is no change in the patients condition since the original H and P. 

## 2015-11-09 NOTE — Op Note (Signed)
Patient brought to the operating room and prepped and draped in the usual manner.  Lid speculum inserted in left eye.  Stab incision made at the twelve o'clock position.  Provisc instilled in the anterior chamber.   A 2.4 mm. Stab incision was made temporally.  An anterior capsulotomy was done with a bent 25 gauge needle.  The nucleus was hydrodissected.  The Phaco tip was inserted in the anterior chamber and the nucleus was emulsified.  CDE was 7.69.  The cortical material was then removed with the I and A tip.  Posterior capsule was the polished.  The anterior chamber was deepened with Provisc.  A 21.5 Diopter Alcon [id number] IOL was then inserted in the capsular bag.  Provisc was then removed with the I and A tip.  The wound was then hydrated.  Patient sent to the Recovery Room in good condition with follow up in my office.  Preoperative Diagnosis:  Nuclear Cataract OS Postoperative Diagnosis:  Same Procedure name: Kelman Phacoemulsification OS with IOL

## 2015-11-09 NOTE — Transfer of Care (Signed)
Immediate Anesthesia Transfer of Care Note  Patient: Darren Rose  Procedure(s) Performed: Procedure(s) with comments: CATARACT EXTRACTION PHACO AND INTRAOCULAR LENS PLACEMENT (IOC) (Left) - CDE: 7.69  Patient Location: PACU and Short Stay  Anesthesia Type:MAC  Level of Consciousness: awake, alert , oriented, patient cooperative and responds to stimulation  Airway & Oxygen Therapy: Patient Spontanous Breathing  Post-op Assessment: Report given to RN, Post -op Vital signs reviewed and stable and Patient moving all extremities  Post vital signs: Reviewed and stable  Last Vitals:  Vitals:   11/09/15 0755 11/09/15 0800  BP: (!) 83/51 (!) 82/48  Resp: 11 11  Temp:      Last Pain:  Vitals:   11/09/15 0725  TempSrc: Oral      Patients Stated Pain Goal: 8 [id number])  Complications: No apparent anesthesia complications

## 2015-11-10 NOTE — Addendum Note (Signed)
Addendum  created 11/10/15 1133 by Ollen Bowl, CRNA   Charge Capture section accepted

## 2015-11-11 NOTE — Addendum Note (Signed)
Addendum  created 11/11/15 1626 by Vista Deck, CRNA   Charge Capture section accepted

## 2015-11-12 ENCOUNTER — Encounter (HOSPITAL_COMMUNITY): Payer: Self-pay | Admitting: Ophthalmology

## 2015-11-15 ENCOUNTER — Encounter (HOSPITAL_COMMUNITY): Payer: Self-pay | Admitting: Ophthalmology

## 2015-11-16 ENCOUNTER — Encounter (HOSPITAL_COMMUNITY): Payer: Self-pay | Admitting: Ophthalmology

## 2015-12-03 ENCOUNTER — Ambulatory Visit (INDEPENDENT_AMBULATORY_CARE_PROVIDER_SITE_OTHER): Payer: Medicare Other | Admitting: Family

## 2015-12-03 ENCOUNTER — Encounter: Payer: Self-pay | Admitting: Family

## 2015-12-03 VITALS — BP 149/69 | HR 54 | Temp 97.0°F | Ht 64.0 in | Wt 124.4 lb

## 2015-12-03 DIAGNOSIS — R531 Weakness: Secondary | ICD-10-CM

## 2015-12-03 DIAGNOSIS — R42 Dizziness and giddiness: Secondary | ICD-10-CM | POA: Diagnosis not present

## 2015-12-03 NOTE — Progress Notes (Signed)
   Subjective:    Patient ID: Darren Rose, male    DOB: 1931/06/25, 82 y.o.   MRN: [id number]  Pt presents to the office today with dizziness that started yesterday. PT states he felt light headed and weak, but woke up today that felt better. PT states he had ate beef and beans for dinner and thought this could have caused the dizziness.  Dizziness  This is a new problem. The current episode started yesterday. The problem occurs intermittently. The problem has been waxing and waning. The symptoms are aggravated by bending. He has tried rest for the symptoms. The treatment provided moderate relief.    Spanish interpreter used.   Review of Systems  Musculoskeletal: Positive for back pain.  Neurological: Positive for dizziness.  All other systems reviewed and are negative.      Objective:   Physical Exam  Constitutional: He is oriented to person, place, and time. He appears well-developed and well-nourished. No distress.  HENT:  Head: Normocephalic.  Right Ear: External ear normal.  Left Ear: External ear normal.  Nose: Nose normal.  Mouth/Throat: Oropharynx is clear and moist.  Eyes: Pupils are equal, round, and reactive to light. Right eye exhibits no discharge. Left eye exhibits no discharge.  Neck: Normal range of motion. Neck supple. No thyromegaly present.  Cardiovascular: Normal rate, regular rhythm, normal heart sounds and intact distal pulses.   No murmur heard. Pulmonary/Chest: Effort normal and breath sounds normal. No respiratory distress. He has no wheezes.  Abdominal: Soft. Bowel sounds are normal. He exhibits no distension. There is no tenderness.  Musculoskeletal: Normal range of motion. He exhibits no edema or tenderness.  Neurological: He is alert and oriented to person, place, and time. He has normal reflexes. No cranial nerve deficit.  Skin: Skin is warm and dry. No rash noted. No erythema.  Psychiatric: He has a normal mood and affect. His behavior is  normal. Judgment and thought content normal.  Vitals reviewed.     BP (!) 149/69   Pulse (!) 54   Temp 97 F (36.1 C) (Oral)   Ht '5\' 4"'$  (1.626 m)   Wt 124 lb 6.4 oz (56.4 kg)   BMI 21.35 kg/m      Assessment & Plan:  1. Dizziness - CMP14+EGFR - CBC with Differential/Platelet  2. Weakness - CMP14+EGFR - CBC with Differential/Platelet  Force fluids, discussed it is ok to drink a coke daily [facility] pending RTO prn and schedule annual wellness with PCP   Evelina Dun, FNP

## 2015-12-03 NOTE — Patient Instructions (Addendum)
Mareos (Dizziness) Los mareos son un problema muy frecuente. Se trata de una sensacin de inestabilidad o de desvanecimiento. Puede sentir que se va a desmayar. Un mareo puede provocarle una lesin si se tropieza o se cae. Las Engineer, manufacturing de todas las edades pueden sufrir Tree surgeon, Armed forces training and education officer es ms frecuente en los adultos [place]. Esta afeccin puede tener muchas causas, entre las que se pueden [employer], la deshidratacin y [place]. INSTRUCCIONES PARA EL CUIDADO EN EL HOGAR Estas indicaciones pueden ayudarlo con el trastorno: Comida y bebida  Beba suficiente lquido para Theatre manager la orina clara o de color amarillo plido. Esto evita la deshidratacin. Trate de beber ms lquidos transparentes, como agua.  No beba alcohol.  Limite el consumo de cafena si el mdico se lo indica.  Limite el consumo de sal si el mdico se lo indica. Actividad  Evite los movimientos rpidos.  Levntese de las sillas con lentitud y apyese hasta sentirse bien.  Por la maana, sintese primero a un lado de la cama. Cuando se sienta bien, pngase lentamente de pie mientras se sostiene de algo, hasta que sepa que ha logrado el equilibrio.  Mueva las piernas con frecuencia si debe estar de pie en un lugar durante mucho tiempo. Mientras est de pie, contraiga y relaje los msculos de las piernas.  No conduzca vehculos ni opere maquinaria pesada si se siente mareado.  Evite agacharse si se siente mareado. En su casa, coloque los objetos de modo que le resulte fcil alcanzarlos sin Office manager. Estilo de vida  No consuma ningn producto que contenga tabaco, lo que incluye cigarrillos, tabaco de Higher education careers adviser o Psychologist, sport and exercise. Si necesita ayuda para dejar de fumar, consulte al [employer].  Trate de reducir el nivel de estrs practicando actividades como el yoga o la meditacin. Hable con el mdico si necesita ayuda. Instrucciones generales  Controle sus mareos para ver si hay cambios.  Tome los  medicamentos solamente como se lo haya indicado el mdico. Hable con el mdico si cree que los medicamentos que est tomando son la causa de sus mareos.  Infrmele a un amigo o a un familiar si se siente mareado. Pdale a esta persona que llame al mdico si observa cambios en su comportamiento.  Concurra a todas las visitas de control como se lo haya indicado el mdico. Esto es importante. SOLICITE ATENCIN MDICA SI:  Los [employer].  Los [employer] o la sensacin de Engineer, petroleum.  Siente nuseas.  Ha perdido la audicin.  Aparecen nuevos sntomas.  Cuando est de pie se siente inestable o que la habitacin da vueltas. SOLICITE ATENCIN MDICA DE INMEDIATO SI:  Vomita o tiene diarrea y no puede comer ni beber nada.  Tiene dificultad para hablar, para caminar, para tragar o para [employer], las [place] piernas.  Siente una debilidad generalizada.  No piensa con claridad o tiene dificultades para armar oraciones. Es posible que un amigo o un familiar adviertan que esto ocurre.  Tiene dolor de pecho, dolor abdominal, sudoracin o Risk manager.  Hay cambios en la visin.  Observa un sangrado.  Tiene dolores de [place].  Tiene dolor o rigidez en el cuello.  Tiene fiebre.   Esta informacin no tiene Marine scientist el consejo del mdico. Asegrese de hacerle al mdico cualquier pregunta que tenga.   Document Released: 01/16/2005 Document Revised: 06/02/2014 Elsevier Interactive Patient Education [employer].

## 2015-12-04 LAB — CMP14+EGFR
ALBUMIN: 4.4 g/dL (ref 3.5–4.7)
ALK PHOS: 80 IU/L (ref 39–117)
ALT: 19 IU/L (ref 0–44)
AST: 25 IU/L (ref 0–40)
Albumin/Globulin Ratio: 1.6 (ref 1.2–2.2)
BILIRUBIN TOTAL: 1 mg/dL (ref 0.0–1.2)
BUN / CREAT RATIO: 36 — AB (ref 10–24)
BUN: 27 mg/dL (ref 8–27)
CHLORIDE: 96 mmol/L (ref 96–106)
CO2: 28 mmol/L (ref 18–29)
Calcium: 9.9 mg/dL (ref 8.6–10.2)
Creatinine, Ser: 0.75 mg/dL — ABNORMAL LOW (ref 0.76–1.27)
GFR calc Af Amer: 99 mL/min/{1.73_m2} (ref >59)
GFR calc non Af Amer: 85 mL/min/{1.73_m2} (ref >59)
GLUCOSE: 98 mg/dL (ref 65–99)
Globulin, Total: 2.7 g/dL (ref 1.5–4.5)
Potassium: 4.1 mmol/L (ref 3.5–5.2)
Sodium: 139 mmol/L (ref 134–144)
Total Protein: 7.1 g/dL (ref 6.0–8.5)

## 2015-12-04 LAB — CBC WITH DIFFERENTIAL/PLATELET
BASOS ABS: 0 10*3/uL (ref 0.0–0.2)
Basos: 1 %
EOS (ABSOLUTE): 0.3 10*3/uL (ref 0.0–0.4)
Eos: 5 %
HEMOGLOBIN: 14.3 g/dL (ref 12.6–17.7)
Hematocrit: 41.8 % (ref 37.5–51.0)
IMMATURE GRANULOCYTES: 0 %
Immature Grans (Abs): 0 10*3/uL (ref 0.0–0.1)
LYMPHS ABS: 2 10*3/uL (ref 0.7–3.1)
Lymphs: 29 %
MCH: 31.2 pg (ref 26.6–33.0)
MCHC: 34.2 g/dL (ref 31.5–35.7)
MCV: 91 fL (ref 79–97)
MONOCYTES: 7 %
Monocytes Absolute: 0.5 10*3/uL (ref 0.1–0.9)
NEUTROS PCT: 58 %
Neutrophils Absolute: 4 10*3/uL (ref 1.4–7.0)
Platelets: 210 10*3/uL (ref 150–379)
RBC: 4.59 x10E6/uL (ref 4.14–5.80)
RDW: 13.6 % (ref 12.3–15.4)
WBC: 6.8 10*3/uL (ref 3.4–10.8)

## 2015-12-15 ENCOUNTER — Other Ambulatory Visit: Payer: Self-pay | Admitting: Family Medicine

## 2015-12-15 DIAGNOSIS — N400 Enlarged prostate without lower urinary tract symptoms: Secondary | ICD-10-CM

## 2015-12-19 ENCOUNTER — Other Ambulatory Visit: Payer: Self-pay | Admitting: Family Medicine

## 2016-01-12 ENCOUNTER — Ambulatory Visit (INDEPENDENT_AMBULATORY_CARE_PROVIDER_SITE_OTHER): Payer: Medicare Other | Admitting: Urology

## 2016-01-12 DIAGNOSIS — R3915 Urgency of urination: Secondary | ICD-10-CM

## 2016-01-12 DIAGNOSIS — L308 Other specified dermatitis: Secondary | ICD-10-CM | POA: Diagnosis not present

## 2016-01-12 DIAGNOSIS — N401 Enlarged prostate with lower urinary tract symptoms: Secondary | ICD-10-CM

## 2016-01-12 DIAGNOSIS — R351 Nocturia: Secondary | ICD-10-CM

## 2016-02-04 DIAGNOSIS — Z01818 Encounter for other preprocedural examination: Secondary | ICD-10-CM | POA: Diagnosis not present

## 2016-02-04 DIAGNOSIS — M47816 Spondylosis without myelopathy or radiculopathy, lumbar region: Secondary | ICD-10-CM | POA: Diagnosis not present

## 2016-02-04 DIAGNOSIS — N400 Enlarged prostate without lower urinary tract symptoms: Secondary | ICD-10-CM | POA: Diagnosis not present

## 2016-02-04 DIAGNOSIS — M542 Cervicalgia: Secondary | ICD-10-CM | POA: Diagnosis not present

## 2016-02-04 DIAGNOSIS — R2689 Other abnormalities of gait and mobility: Secondary | ICD-10-CM | POA: Diagnosis not present

## 2016-02-04 DIAGNOSIS — T502X5A Adverse effect of carbonic-anhydrase inhibitors, benzothiadiazides and other diuretics, initial encounter: Secondary | ICD-10-CM | POA: Diagnosis not present

## 2016-02-04 DIAGNOSIS — R270 Ataxia, unspecified: Secondary | ICD-10-CM | POA: Diagnosis not present

## 2016-02-04 DIAGNOSIS — R42 Dizziness and giddiness: Secondary | ICD-10-CM | POA: Diagnosis not present

## 2016-02-04 DIAGNOSIS — R918 Other nonspecific abnormal finding of lung field: Secondary | ICD-10-CM | POA: Diagnosis not present

## 2016-02-04 DIAGNOSIS — Z87891 Personal history of nicotine dependence: Secondary | ICD-10-CM | POA: Diagnosis not present

## 2016-02-04 DIAGNOSIS — M47812 Spondylosis without myelopathy or radiculopathy, cervical region: Secondary | ICD-10-CM | POA: Diagnosis not present

## 2016-02-04 DIAGNOSIS — G459 Transient cerebral ischemic attack, unspecified: Secondary | ICD-10-CM | POA: Diagnosis not present

## 2016-02-04 DIAGNOSIS — M19041 Primary osteoarthritis, right hand: Secondary | ICD-10-CM | POA: Diagnosis not present

## 2016-02-04 DIAGNOSIS — Z79899 Other long term (current) drug therapy: Secondary | ICD-10-CM | POA: Diagnosis not present

## 2016-02-04 DIAGNOSIS — I10 Essential (primary) hypertension: Secondary | ICD-10-CM | POA: Diagnosis not present

## 2016-02-04 DIAGNOSIS — E875 Hyperkalemia: Secondary | ICD-10-CM | POA: Diagnosis not present

## 2016-02-04 DIAGNOSIS — M19042 Primary osteoarthritis, left hand: Secondary | ICD-10-CM | POA: Diagnosis not present

## 2016-02-08 ENCOUNTER — Telehealth: Payer: Self-pay

## 2016-02-08 NOTE — Telephone Encounter (Signed)
error 

## 2016-02-18 ENCOUNTER — Ambulatory Visit: Payer: Medicare Other | Admitting: Family Medicine

## 2016-02-18 DIAGNOSIS — I6523 Occlusion and stenosis of bilateral carotid arteries: Secondary | ICD-10-CM | POA: Diagnosis not present

## 2016-02-18 DIAGNOSIS — R931 Abnormal findings on diagnostic imaging of heart and coronary circulation: Secondary | ICD-10-CM | POA: Diagnosis not present

## 2016-02-18 DIAGNOSIS — I352 Nonrheumatic aortic (valve) stenosis with insufficiency: Secondary | ICD-10-CM | POA: Diagnosis not present

## 2016-02-18 DIAGNOSIS — I071 Rheumatic tricuspid insufficiency: Secondary | ICD-10-CM | POA: Diagnosis not present

## 2016-02-18 DIAGNOSIS — I340 Nonrheumatic mitral (valve) insufficiency: Secondary | ICD-10-CM | POA: Diagnosis not present

## 2016-02-18 DIAGNOSIS — E041 Nontoxic single thyroid nodule: Secondary | ICD-10-CM | POA: Diagnosis not present

## 2016-02-18 DIAGNOSIS — G459 Transient cerebral ischemic attack, unspecified: Secondary | ICD-10-CM | POA: Diagnosis not present

## 2016-02-18 DIAGNOSIS — I371 Nonrheumatic pulmonary valve insufficiency: Secondary | ICD-10-CM | POA: Diagnosis not present

## 2016-02-25 ENCOUNTER — Encounter (INDEPENDENT_AMBULATORY_CARE_PROVIDER_SITE_OTHER): Payer: Self-pay

## 2016-02-25 ENCOUNTER — Ambulatory Visit: Payer: Medicare Other | Admitting: Family Medicine

## 2016-02-25 ENCOUNTER — Ambulatory Visit (INDEPENDENT_AMBULATORY_CARE_PROVIDER_SITE_OTHER): Payer: Medicare Other | Admitting: Family Medicine

## 2016-02-25 ENCOUNTER — Encounter: Payer: Self-pay | Admitting: Family Medicine

## 2016-02-25 VITALS — BP 132/69 | HR 53 | Temp 97.0°F | Ht 64.0 in | Wt 135.0 lb

## 2016-02-25 DIAGNOSIS — R911 Solitary pulmonary nodule: Secondary | ICD-10-CM | POA: Diagnosis not present

## 2016-02-25 DIAGNOSIS — R531 Weakness: Secondary | ICD-10-CM

## 2016-02-25 DIAGNOSIS — J984 Other disorders of lung: Secondary | ICD-10-CM

## 2016-02-25 NOTE — Progress Notes (Signed)
BP 132/69   Pulse (!) 53   Temp 97 F (36.1 C) (Oral)   Ht 5\' 4"  (1.626 m)   Wt 135 lb (61.2 kg)   BMI 23.17 kg/m    Subjective:    Patient ID: Darren Rose, male    DOB: 31-Aug-1931, 83 y.o.   MRN: [id number]  HPI: Darren Rose is a 83 y.o. male presenting on 02/25/2016 for Hospitalization Follow-up (pt here today for follow up after going to [facility] for being dizzy, lightheaded)   HPI Bilateral leg weakness Patient had an episode of bilateral leg weakness on 02/04/2016 early in the morning around 3 AM that occurred when he got up to go use the restroom and he felt weak and felt like he couldn't stand up and was taken to the emergency department via EMS. He was seen at [facility] on that day and he said the weakness lasted about 2 hours and then this went away and has never come back. They said he may have had something along the lines of a TIA versus a vasovagal response. He denied any chest pain or shortness of breath or lightheadedness or dizziness associated with it but it was more just that feeling of weak and his legs were pending give out on him. While he was at [facility] he was diagnosed with a pulmonary lesion on radiograph he and they recommended that he have follow-up for that which was about 3 weeks ago. We do not have the imaging results or records from them at this point and are requesting that and we'll proceed from that point.  Relevant past medical, surgical, family and social history reviewed and updated as indicated. Interim medical history since our last visit reviewed. Allergies and medications reviewed and updated.  Review of Systems  Constitutional: Negative for chills and fever.  HENT: Negative for congestion.   Eyes: Negative for discharge.  Respiratory: Negative for cough, chest tightness, shortness of breath and wheezing.   Cardiovascular: Negative for chest pain and leg swelling.  Musculoskeletal: Negative for back pain and gait  problem.  Skin: Negative for rash.  Neurological: Positive for weakness. Negative for facial asymmetry, light-headedness and numbness.  All other systems reviewed and are negative.   Per HPI unless specifically indicated above     Objective:    BP 132/69   Pulse (!) 53   Temp 97 F (36.1 C) (Oral)   Ht 5\' 4"  (1.626 m)   Wt 135 lb (61.2 kg)   BMI 23.17 kg/m   Wt Readings from Last 3 Encounters:  02/25/16 135 lb (61.2 kg)  12/03/15 124 lb 6.4 oz (56.4 kg)  11/09/15 132 lb (59.9 kg)    Physical Exam  Constitutional: He is oriented to person, place, and time. He appears well-developed and well-nourished. No distress.  Eyes: Conjunctivae are normal. Right eye exhibits no discharge. Left eye exhibits no discharge. No scleral icterus.  Neck: Neck supple. No thyromegaly present.  Cardiovascular: Normal rate, regular rhythm, normal heart sounds and intact distal pulses.   No murmur heard. Pulmonary/Chest: Effort normal and breath sounds normal. No respiratory distress. He has no wheezes. He has no rales.  Musculoskeletal: Normal range of motion. He exhibits no edema.  Lymphadenopathy:    He has no cervical adenopathy.  Neurological: He is alert and oriented to person, place, and time. No cranial nerve deficit. He exhibits normal muscle tone. Coordination normal.  Skin: Skin is warm and dry. No rash noted. He is not diaphoretic.  Psychiatric: He has a normal mood and affect. His behavior is normal.  Nursing note and vitals reviewed.     Assessment & Plan:   Problem List Items Addressed This Visit    None    Visit Diagnoses    Weakness    -  Primary   He had an acute episode of bilateral leg weakness that lasted about 2 hours, seen in [place] has not had since 02/04/2016   Pulmonary lesion       He was told he had a pulmonary lesion at [facility] and was told to follow up that they thought it might be a pneumonia, requesting results       Follow up plan: Return in about 3  months (around 05/25/2016), or if symptoms worsen or fail to improve, for Follow-up pulmonary nodule.  Counseling provided for all of the vaccine components No orders of the defined types were placed in this encounter.   Caryl Pina, MD [facility] 02/25/2016, 10:09 AM

## 2016-03-14 ENCOUNTER — Other Ambulatory Visit: Payer: Self-pay | Admitting: Family Medicine

## 2016-03-14 DIAGNOSIS — I10 Essential (primary) hypertension: Secondary | ICD-10-CM

## 2016-04-14 ENCOUNTER — Other Ambulatory Visit: Payer: Self-pay | Admitting: Family Medicine

## 2016-04-17 ENCOUNTER — Other Ambulatory Visit: Payer: Self-pay | Admitting: Family Medicine

## 2016-04-17 DIAGNOSIS — E785 Hyperlipidemia, unspecified: Secondary | ICD-10-CM

## 2016-04-25 DIAGNOSIS — Z961 Presence of intraocular lens: Secondary | ICD-10-CM | POA: Diagnosis not present

## 2016-05-25 ENCOUNTER — Encounter: Payer: Self-pay | Admitting: Family Medicine

## 2016-05-25 ENCOUNTER — Ambulatory Visit (INDEPENDENT_AMBULATORY_CARE_PROVIDER_SITE_OTHER): Payer: Medicare Other | Admitting: Family Medicine

## 2016-05-25 VITALS — BP 138/68 | HR 57 | Temp 97.9°F | Ht 64.0 in | Wt 133.0 lb

## 2016-05-25 DIAGNOSIS — R7303 Prediabetes: Secondary | ICD-10-CM

## 2016-05-25 DIAGNOSIS — I10 Essential (primary) hypertension: Secondary | ICD-10-CM | POA: Diagnosis not present

## 2016-05-25 DIAGNOSIS — E785 Hyperlipidemia, unspecified: Secondary | ICD-10-CM

## 2016-05-25 DIAGNOSIS — Z9189 Other specified personal risk factors, not elsewhere classified: Secondary | ICD-10-CM | POA: Diagnosis not present

## 2016-05-25 LAB — BAYER DCA HB A1C WAIVED: HB A1C: 5.7 % (ref <7.0)

## 2016-05-25 NOTE — Progress Notes (Signed)
BP 138/68   Pulse (!) 57   Temp 97.9 F (36.6 C) (Oral)   Ht 5' 4"  (1.626 m)   Wt 133 lb (60.3 kg)   BMI 22.83 kg/m    Subjective:    Patient ID: Darren Rose, male    DOB: 1931/11/13, 83 y.o.   MRN: [id number]  HPI: Darren Rose is a 83 y.o. male presenting on 05/25/2016 for Hyperlipidemia (3 month followup; patient is fasting) and Hypertension   HPI Hyperlipidemia Patient is coming in for recheck of his hyperlipidemia. He is currently taking Lipitor. He denies any issues with myalgias or history of liver damage from it. He denies any focal numbness or weakness or chest pain.   Hypertension recheck Patient is coming in today for a blood pressure recheck. His blood pressure is 138/68 today. He is currently on hydrochlorothiazide for this. He denies any lightheadedness or dizziness. Patient denies headaches, blurred vision, chest pains, shortness of breath, or weakness. Denies any side effects from medication and is content with current medication.   Prediabetes Patient has been diagnosed with prediabetes and had an A1c of 6.0, we are continuing to monitor this we'll recheck this with her labs today. He denies any issues with his feet or vision today. He denies any chest pain or shortness of breath but does have more issues with exertion recently.  Relevant past medical, surgical, family and social history reviewed and updated as indicated. Interim medical history since our last visit reviewed. Allergies and medications reviewed and updated.  Review of Systems  Constitutional: Negative for chills and fever.  Eyes: Negative for discharge.  Respiratory: Negative for shortness of breath and wheezing.   Cardiovascular: Negative for chest pain and leg swelling.  Gastrointestinal: Negative for abdominal pain.  Musculoskeletal: Negative for back pain and gait problem.  Skin: Negative for rash.  Neurological: Negative for dizziness, weakness, light-headedness and numbness.    All other systems reviewed and are negative.  Per HPI unless specifically indicated above     Objective:    BP 138/68   Pulse (!) 57   Temp 97.9 F (36.6 C) (Oral)   Ht 5' 4"  (1.626 m)   Wt 133 lb (60.3 kg)   BMI 22.83 kg/m   Wt Readings from Last 3 Encounters:  05/25/16 133 lb (60.3 kg)  02/25/16 135 lb (61.2 kg)  12/03/15 124 lb 6.4 oz (56.4 kg)    Physical Exam  Constitutional: He is oriented to person, place, and time. He appears well-developed and well-nourished. No distress.  Eyes: Conjunctivae are normal. No scleral icterus.  Neck: Neck supple. No thyromegaly present.  Cardiovascular: Normal rate, regular rhythm, normal heart sounds and intact distal pulses.   No murmur heard. Pulmonary/Chest: Effort normal and breath sounds normal. No respiratory distress. He has no wheezes. He has no rales.  Musculoskeletal: Normal range of motion. He exhibits no edema.  Lymphadenopathy:    He has no cervical adenopathy.  Neurological: He is alert and oriented to person, place, and time. Coordination normal.  Skin: Skin is warm and dry. No rash noted. He is not diaphoretic.  Psychiatric: He has a normal mood and affect. His behavior is normal.  Nursing note and vitals reviewed.     Assessment & Plan:   Problem List Items Addressed This Visit      Cardiovascular and Mediastinum   Essential hypertension, benign   Relevant Orders   CMP14+EGFR   CBC with Differential/Platelet     Other   Hyperlipidemia  with target LDL less than 100 - Primary   Relevant Orders   Lipid panel   Prediabetes   Relevant Orders   CBC with Differential/Platelet   Bayer DCA Hb A1c Waived    Other Visit Diagnoses    [place] cardiac risk >20% in next 10 years       Relevant Orders   Ambulatory referral to Cardiology       Follow up plan: Return in about 6 months (around 11/24/2016), or if symptoms worsen or fail to improve, for Recheck cholesterol and blood pressure.  Counseling  provided for all of the vaccine components Orders Placed This Encounter  Procedures  . CMP14+EGFR  . CBC with Differential/Platelet  . Lipid panel  . Bayer DCA Hb A1c Waived  . Ambulatory referral to Cardiology    Caryl Pina, MD [facility] 05/25/2016, 11:45 AM

## 2016-05-26 ENCOUNTER — Ambulatory Visit: Payer: Medicare Other | Admitting: Family Medicine

## 2016-05-26 LAB — CBC WITH DIFFERENTIAL/PLATELET
BASOS: 1 %
Basophils Absolute: 0.1 10*3/uL (ref 0.0–0.2)
EOS (ABSOLUTE): 0.3 10*3/uL (ref 0.0–0.4)
EOS: 5 %
HEMATOCRIT: 39.2 % (ref 37.5–51.0)
Hemoglobin: 13.1 g/dL (ref 13.0–17.7)
IMMATURE GRANS (ABS): 0 10*3/uL (ref 0.0–0.1)
IMMATURE GRANULOCYTES: 0 %
Lymphocytes Absolute: 2.3 10*3/uL (ref 0.7–3.1)
Lymphs: 36 %
MCH: 31.2 pg (ref 26.6–33.0)
MCHC: 33.4 g/dL (ref 31.5–35.7)
MCV: 93 fL (ref 79–97)
MONOS ABS: 0.5 10*3/uL (ref 0.1–0.9)
Monocytes: 8 %
NEUTROS ABS: 3.3 10*3/uL (ref 1.4–7.0)
Neutrophils: 50 %
PLATELETS: 232 10*3/uL (ref 150–379)
RBC: 4.2 x10E6/uL (ref 4.14–5.80)
RDW: 13.7 % (ref 12.3–15.4)
WBC: 6.6 10*3/uL (ref 3.4–10.8)

## 2016-05-26 LAB — CMP14+EGFR
A/G RATIO: 1.7 (ref 1.2–2.2)
ALBUMIN: 4 g/dL (ref 3.5–4.7)
ALT: 26 IU/L (ref 0–44)
AST: 32 IU/L (ref 0–40)
Alkaline Phosphatase: 74 IU/L (ref 39–117)
BUN/Creatinine Ratio: 43 — ABNORMAL HIGH (ref 10–24)
BUN: 29 mg/dL — ABNORMAL HIGH (ref 8–27)
Bilirubin Total: 0.7 mg/dL (ref 0.0–1.2)
CALCIUM: 9.4 mg/dL (ref 8.6–10.2)
CO2: 30 mmol/L — AB (ref 18–29)
CREATININE: 0.68 mg/dL — AB (ref 0.76–1.27)
Chloride: 101 mmol/L (ref 96–106)
GFR, EST AFRICAN AMERICAN: 102 mL/min/{1.73_m2} (ref >59)
GFR, EST NON AFRICAN AMERICAN: 88 mL/min/{1.73_m2} (ref >59)
GLOBULIN, TOTAL: 2.4 g/dL (ref 1.5–4.5)
Glucose: 94 mg/dL (ref 65–99)
POTASSIUM: 4.1 mmol/L (ref 3.5–5.2)
SODIUM: 144 mmol/L (ref 134–144)
TOTAL PROTEIN: 6.4 g/dL (ref 6.0–8.5)

## 2016-05-26 LAB — LIPID PANEL
Chol/HDL Ratio: 2.4 ratio (ref 0.0–5.0)
Cholesterol, Total: 116 mg/dL (ref 100–199)
HDL: 48 mg/dL (ref >39)
LDL Calculated: 60 mg/dL (ref 0–99)
Triglycerides: 41 mg/dL (ref 0–149)
VLDL CHOLESTEROL CAL: 8 mg/dL (ref 5–40)

## 2016-06-14 ENCOUNTER — Ambulatory Visit (INDEPENDENT_AMBULATORY_CARE_PROVIDER_SITE_OTHER): Payer: Medicare Other | Admitting: Cardiology

## 2016-06-14 ENCOUNTER — Encounter: Payer: Self-pay | Admitting: Cardiology

## 2016-06-14 VITALS — BP 120/58 | HR 52 | Ht 64.0 in | Wt 135.0 lb

## 2016-06-14 DIAGNOSIS — R739 Hyperglycemia, unspecified: Secondary | ICD-10-CM | POA: Diagnosis not present

## 2016-06-14 DIAGNOSIS — I10 Essential (primary) hypertension: Secondary | ICD-10-CM

## 2016-06-14 DIAGNOSIS — E785 Hyperlipidemia, unspecified: Secondary | ICD-10-CM | POA: Diagnosis not present

## 2016-06-14 NOTE — Patient Instructions (Signed)
Medication Instructions:  The current medical regimen is effective;  continue present plan and medications.  Follow-Up: Follow up as needed.  Thank you for choosing [facility]!!     
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
## 2016-06-15 ENCOUNTER — Encounter: Payer: Self-pay | Admitting: Cardiology

## 2016-06-15 DIAGNOSIS — R739 Hyperglycemia, unspecified: Secondary | ICD-10-CM | POA: Insufficient documentation

## 2016-06-15 DIAGNOSIS — E785 Hyperlipidemia, unspecified: Secondary | ICD-10-CM | POA: Insufficient documentation

## 2016-06-22 ENCOUNTER — Encounter: Payer: Self-pay | Admitting: Family Medicine

## 2016-06-22 ENCOUNTER — Ambulatory Visit (INDEPENDENT_AMBULATORY_CARE_PROVIDER_SITE_OTHER): Payer: Medicare Other | Admitting: Family Medicine

## 2016-06-22 VITALS — BP 126/81 | HR 60 | Temp 97.7°F | Ht 64.0 in | Wt 133.0 lb

## 2016-06-22 DIAGNOSIS — M545 Low back pain: Secondary | ICD-10-CM

## 2016-06-22 DIAGNOSIS — R109 Unspecified abdominal pain: Secondary | ICD-10-CM | POA: Diagnosis not present

## 2016-06-22 DIAGNOSIS — G8929 Other chronic pain: Secondary | ICD-10-CM

## 2016-06-22 LAB — URINALYSIS, COMPLETE
Bilirubin, UA: NEGATIVE
GLUCOSE, UA: NEGATIVE
Ketones, UA: NEGATIVE
Leukocytes, UA: NEGATIVE
NITRITE UA: NEGATIVE
Specific Gravity, UA: 1.015 (ref 1.005–1.030)
UUROB: 0.2 mg/dL (ref 0.2–1.0)
pH, UA: 8.5 — ABNORMAL HIGH (ref 5.0–7.5)

## 2016-06-22 LAB — MICROSCOPIC EXAMINATION
Bacteria, UA: NONE SEEN
RBC, UA: NONE SEEN /hpf (ref 0–?)
Renal Epithel, UA: NONE SEEN /hpf
WBC, UA: NONE SEEN /hpf (ref 0–?)

## 2016-06-22 MED ORDER — CYCLOBENZAPRINE HCL 10 MG PO TABS: 10.0000 mg | ORAL_TABLET | Freq: Three times a day (TID) | ORAL | 0 refills | Status: DC | PRN

## 2016-06-22 NOTE — Progress Notes (Signed)
BP 126/81   Pulse 60   Temp 97.7 F (36.5 C) (Oral)   Ht 5\' 4"  (1.626 m)   Wt 133 lb (60.3 kg)   BMI 22.83 kg/m    Subjective:    Patient ID: Darren Rose, male    DOB: [birth year], 83 y.o.   MRN: [id number]  HPI: Darren Rose is a 83 y.o. male presenting on 06/22/2016 for Left flank pain, worsens with urination   HPI Low back pain on left side Left lower back pain has been bothering him for the past couple months off and on. He has been taking Advil for it which has been helping. He just wants to make sure it's not something else like an infection and that's why came in today. He left a urine for [place]. He denies any blood in his urine or burning with urination. He denies any urinary frequency currently. He denies any fevers or chills or shortness of breath or wheezing. He cannot recall any specific incident or traumatic event that brought on the pain.  Relevant past medical, surgical, family and social history reviewed and updated as indicated. Interim medical history since our last visit reviewed. Allergies and medications reviewed and updated.  Review of Systems  Constitutional: Negative for chills and fever.  Eyes: Negative for discharge.  Respiratory: Negative for shortness of breath and wheezing.   Cardiovascular: Negative for chest pain and leg swelling.  Gastrointestinal: Negative for abdominal pain.  Genitourinary: Negative for decreased urine volume, dysuria, frequency, hematuria and urgency.  Musculoskeletal: Positive for back pain and myalgias. Negative for arthralgias and gait problem.  Skin: Negative for rash.  All other systems reviewed and are negative.   Per HPI unless specifically indicated above        Objective:    BP 126/81   Pulse 60   Temp 97.7 F (36.5 C) (Oral)   Ht 5\' 4"  (1.626 m)   Wt 133 lb (60.3 kg)   BMI 22.83 kg/m   Wt Readings from Last 3 Encounters:  06/22/16 133 lb (60.3 kg)  06/14/16 135 lb (61.2 kg)  05/25/16 133 lb  (60.3 kg)    Physical Exam  Constitutional: He is oriented to person, place, and time. He appears well-developed and well-nourished. No distress.  Eyes: Conjunctivae are normal. No scleral icterus.  Cardiovascular: Normal rate, regular rhythm, normal heart sounds and intact distal pulses.   No murmur heard. Pulmonary/Chest: Effort normal and breath sounds normal. No respiratory distress. He has no wheezes. He has no rales.  Musculoskeletal: Normal range of motion. He exhibits no edema or tenderness (No muscular tenderness on exam in back or lower leg or neck. Unable to elicit tenderness with range of motion either today).  Neurological: He is alert and oriented to person, place, and time. Coordination normal.  Skin: Skin is warm and dry. No rash noted. He is not diaphoretic.  Psychiatric: He has a normal mood and affect. His behavior is normal.  Nursing note and vitals reviewed.   Urinalysis: 0-10 epithelial cells, crystals that are present, amorphous crystals, otherwise normal    Assessment & Plan:   Problem List Items Addressed This Visit    None    Visit Diagnoses    Chronic left-sided low back pain without sciatica    -  Primary   Been going on for 2 months, will send a muscle relaxer   Relevant Medications   cyclobenzaprine (FLEXERIL) 10 MG tablet   Other Relevant Orders   Urinalysis, Complete  Follow up plan: Return if symptoms worsen or fail to improve.  Counseling provided for all of the vaccine components Orders Placed This Encounter  Procedures  . Urinalysis, Complete    Caryl Pina, MD [facility] 06/22/2016, 4:38 PM

## 2016-06-28 ENCOUNTER — Ambulatory Visit: Payer: Medicare Other | Admitting: Cardiology

## 2016-07-12 ENCOUNTER — Ambulatory Visit: Payer: Medicare Other | Admitting: Urology

## 2016-07-19 ENCOUNTER — Other Ambulatory Visit: Payer: Self-pay | Admitting: Family Medicine

## 2016-09-05 ENCOUNTER — Other Ambulatory Visit: Payer: Self-pay | Admitting: Family Medicine

## 2016-09-05 DIAGNOSIS — I10 Essential (primary) hypertension: Secondary | ICD-10-CM

## 2016-09-08 ENCOUNTER — Ambulatory Visit (INDEPENDENT_AMBULATORY_CARE_PROVIDER_SITE_OTHER): Payer: Medicare Other | Admitting: Family Medicine

## 2016-09-08 ENCOUNTER — Encounter: Payer: Self-pay | Admitting: Family Medicine

## 2016-09-08 VITALS — BP 136/70 | HR 46 | Temp 97.1°F | Ht 64.0 in | Wt 132.0 lb

## 2016-09-08 DIAGNOSIS — G8929 Other chronic pain: Secondary | ICD-10-CM | POA: Diagnosis not present

## 2016-09-08 DIAGNOSIS — M25511 Pain in right shoulder: Secondary | ICD-10-CM | POA: Diagnosis not present

## 2016-09-08 NOTE — Progress Notes (Signed)
BP 136/70   Pulse (!) 46   Temp (!) 97.1 F (36.2 C) (Oral)   Ht 5\' 4"  (1.626 m)   Wt 132 lb (59.9 kg)   BMI 22.66 kg/m    Subjective:    Patient ID: Darren Rose, male    DOB: 1931/03/04, 83 y.o.   MRN: [id number]  HPI: Darren Rose is a 83 y.o. male presenting on 09/08/2016 for Right shoulder pain)   HPI  Right shoulder pain Patient comes in today complaining of continued right shoulder pain, he says is worse with overhead range of motion and he feels like he is starting to get some weakness in that shoulder. He denies any fevers or chills or redness or warmth. He denies any swelling. He says weakness is mostly with overhead range of motion across body range of motion. He says there is pain when he does those motions. He denies any loss of range of motion.  Relevant past medical, surgical, family and social history reviewed and updated as indicated. Interim medical history since our last visit reviewed. Allergies and medications reviewed and updated.  Review of Systems  Constitutional: Negative for chills and fever.  Respiratory: Negative for shortness of breath and wheezing.   Cardiovascular: Negative for chest pain and leg swelling.  Musculoskeletal: Positive for arthralgias. Negative for back pain, gait problem and joint swelling.  Skin: Negative for color change and rash.  All other systems reviewed and are negative.   Per HPI unless specifically indicated above     Objective:    BP 136/70   Pulse (!) 46   Temp (!) 97.1 F (36.2 C) (Oral)   Ht 5\' 4"  (1.626 m)   Wt 132 lb (59.9 kg)   BMI 22.66 kg/m   Wt Readings from Last 3 Encounters:  09/08/16 132 lb (59.9 kg)  06/22/16 133 lb (60.3 kg)  06/14/16 135 lb (61.2 kg)    Physical Exam  Constitutional: He is oriented to person, place, and time. He appears well-developed and well-nourished. No distress.  Eyes: Conjunctivae are normal. No scleral icterus.  Cardiovascular: Normal rate, regular rhythm,  normal heart sounds and intact distal pulses.   No murmur heard. Pulmonary/Chest: Effort normal and breath sounds normal. No respiratory distress. He has no wheezes. He has no rales.  Musculoskeletal: Normal range of motion. He exhibits no edema.       Right shoulder: He exhibits tenderness (Hawkins impingement sign positive, weakness with empty can test and external rotation.). He exhibits normal range of motion and no deformity.  Neurological: He is alert and oriented to person, place, and time. Coordination normal.  Skin: Skin is warm and dry. No rash noted. He is not diaphoretic.  Psychiatric: He has a normal mood and affect. His behavior is normal.  Nursing note and vitals reviewed.     Assessment & Plan:   Problem List Items Addressed This Visit    None    Visit Diagnoses    Chronic right shoulder pain    -  Primary   Likely related to arthritis/chronic rotator cuff issues. We'll refer to orthopedic   Relevant Orders   Ambulatory referral to Orthopedic Surgery       Follow up plan: Return if symptoms worsen or fail to improve.  Counseling provided for all of the vaccine components Orders Placed This Encounter  Procedures  . Ambulatory referral to Naylor Tyaire Odem, MD [facility] 09/08/2016, 12:06 PM

## 2016-09-28 ENCOUNTER — Encounter (INDEPENDENT_AMBULATORY_CARE_PROVIDER_SITE_OTHER): Payer: Self-pay | Admitting: Orthopaedic Surgery

## 2016-09-28 ENCOUNTER — Ambulatory Visit (INDEPENDENT_AMBULATORY_CARE_PROVIDER_SITE_OTHER): Payer: Medicare Other | Admitting: Orthopaedic Surgery

## 2016-09-28 VITALS — BP 150/79 | HR 48 | Ht 65.0 in | Wt 138.0 lb

## 2016-09-28 DIAGNOSIS — M7541 Impingement syndrome of right shoulder: Secondary | ICD-10-CM

## 2016-09-28 DIAGNOSIS — M75121 Complete rotator cuff tear or rupture of right shoulder, not specified as traumatic: Secondary | ICD-10-CM
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
## 2016-10-02 MED ORDER — METHYLPREDNISOLONE ACETATE 40 MG/ML IJ SUSP
40.0000 mg | INTRAMUSCULAR | Status: AC | PRN
  Administered 2016-09-28: 40 mg via INTRA_ARTICULAR

## 2016-10-02 MED ORDER — LIDOCAINE HCL 1 % IJ SOLN
1.0000 mL | INTRAMUSCULAR | Status: AC | PRN
  Administered 2016-09-28: 1 mL

## 2016-10-02 MED ORDER — BUPIVACAINE HCL 0.25 % IJ SOLN
4.0000 mL | INTRAMUSCULAR | Status: AC | PRN
  Administered 2016-09-28: 4 mL via INTRA_ARTICULAR

## 2016-10-05 ENCOUNTER — Ambulatory Visit (INDEPENDENT_AMBULATORY_CARE_PROVIDER_SITE_OTHER): Payer: Medicare Other | Admitting: Orthopaedic Surgery

## 2016-10-06 ENCOUNTER — Other Ambulatory Visit: Payer: Self-pay | Admitting: Family Medicine

## 2016-10-06 DIAGNOSIS — E785 Hyperlipidemia, unspecified: Secondary | ICD-10-CM

## 2016-10-19 ENCOUNTER — Ambulatory Visit (INDEPENDENT_AMBULATORY_CARE_PROVIDER_SITE_OTHER): Payer: Medicare Other | Admitting: Family Medicine

## 2016-10-19 ENCOUNTER — Encounter: Payer: Self-pay | Admitting: Family Medicine

## 2016-10-19 VITALS — BP 136/68 | HR 54 | Temp 97.0°F | Ht 65.0 in | Wt 133.0 lb

## 2016-10-19 DIAGNOSIS — R300 Dysuria: Secondary | ICD-10-CM

## 2016-10-19 DIAGNOSIS — N400 Enlarged prostate without lower urinary tract symptoms: Secondary | ICD-10-CM | POA: Diagnosis not present

## 2016-10-19 LAB — MICROSCOPIC EXAMINATION
BACTERIA UA: NONE SEEN
Epithelial Cells (non renal): NONE SEEN /hpf (ref 0–10)
Renal Epithel, UA: NONE SEEN /hpf
WBC UA: NONE SEEN /HPF (ref 0–?)

## 2016-10-19 LAB — URINALYSIS, COMPLETE
BILIRUBIN UA: NEGATIVE
GLUCOSE, UA: NEGATIVE
Ketones, UA: NEGATIVE
LEUKOCYTES UA: NEGATIVE
NITRITE UA: NEGATIVE
PH UA: 7 (ref 5.0–7.5)
PROTEIN UA: NEGATIVE
Specific Gravity, UA: 1.01 (ref 1.005–1.030)
UUROB: 0.2 mg/dL (ref 0.2–1.0)

## 2016-10-19 MED ORDER — SULFAMETHOXAZOLE-TRIMETHOPRIM 800-160 MG PO TABS: 1.0000 | ORAL_TABLET | Freq: Two times a day (BID) | ORAL | 0 refills | Status: DC

## 2016-10-19 NOTE — Progress Notes (Signed)
BP 136/68   Pulse (!) 54   Temp (!) 97 F (36.1 C) (Oral)   Ht 5\' 5"  (1.651 m)   Wt 133 lb (60.3 kg)   BMI 22.13 kg/m    Subjective:    Patient ID: Darren Rose, male    DOB: 05/11/1931, 83 y.o.   MRN: [id number]  HPI: Darren Rose is a 83 y.o. male presenting on 10/19/2016 for Urinary Tract Infection (low back pain, dysuria x 1 week, worsening)   HPI Dysuria and lower back pain Patient is coming in with urinary tract infection symptoms going on for one week. He also complains of low right-sided back pain. He denies any fevers or chills or hematuria. He has been having this pain for about a week. He also complains of prostate issues and says he needs go back and see that Dr. and says that he is urinating but has been urinating less but still urinates frequently. He does wake up multiple times a night as well.  Relevant past medical, surgical, family and social history reviewed and updated as indicated. Interim medical history since our last visit reviewed. Allergies and medications reviewed and updated.  Review of Systems  Constitutional: Negative for chills and fever.  Respiratory: Negative for shortness of breath and wheezing.   Cardiovascular: Negative for chest pain and leg swelling.  Gastrointestinal: Positive for abdominal pain. Negative for blood in stool and diarrhea.  Genitourinary: Positive for difficulty urinating, flank pain and frequency. Negative for hematuria and testicular pain.  Musculoskeletal: Positive for back pain. Negative for gait problem.  Skin: Negative for rash.  All other systems reviewed and are negative.   Per HPI unless specifically indicated above        Objective:    BP 136/68   Pulse (!) 54   Temp (!) 97 F (36.1 C) (Oral)   Ht 5\' 5"  (1.651 m)   Wt 133 lb (60.3 kg)   BMI 22.13 kg/m   Wt Readings from Last 3 Encounters:  10/19/16 133 lb (60.3 kg)  09/28/16 138 lb (62.6 kg)  09/08/16 132 lb (59.9 kg)    Physical Exam    Constitutional: He is oriented to person, place, and time. He appears well-developed and well-nourished. No distress.  Eyes: Conjunctivae are normal. No scleral icterus.  Cardiovascular: Normal rate, regular rhythm, normal heart sounds and intact distal pulses.   No murmur heard. Pulmonary/Chest: Effort normal and breath sounds normal. No respiratory distress. He has no wheezes. He has no rales.  Abdominal: Soft. Bowel sounds are normal. He exhibits no distension. There is no hepatosplenomegaly. There is tenderness. There is CVA tenderness. There is no rigidity, no rebound and no guarding.  Musculoskeletal: Normal range of motion. He exhibits tenderness (ight lower back pain and left lower back pain but n right than left. Negative straight leg raise bilaterally). He exhibits no edema.  Neurological: He is alert and oriented to person, place, and time. Coordination normal.  Skin: Skin is warm and dry. No rash noted. He is not diaphoretic.  Psychiatric: He has a normal mood and affect. His behavior is normal.  Nursing note and vitals reviewed.  Urinalysis: 0-2 RBCs, mucus present, trace blood, otherwise normal    Assessment & Plan:   Problem List Items Addressed This Visit      Genitourinary   BPH (benign prostatic hyperplasia)   Relevant Orders   Ambulatory referral to Urology    Other Visit Diagnoses    Dysuria    -  Primary   Relevant Medications   sulfamethoxazole-trimethoprim (BACTRIM DS,SEPTRA DS) 800-160 MG tablet   Other Relevant Orders   Urinalysis, Complete   Urine Culture       Follow up plan: Return if symptoms worsen or fail to improve.  Counseling provided for all of the vaccine components Orders Placed This Encounter  Procedures  . Urinalysis, Complete  . Ambulatory referral to Urology    Caryl Pina, MD [facility] 10/19/2016, 1:46 PM

## 2016-10-21 LAB — URINE CULTURE

## 2016-10-22 ENCOUNTER — Other Ambulatory Visit: Payer: Self-pay | Admitting: Urology

## 2016-10-27 ENCOUNTER — Ambulatory Visit (INDEPENDENT_AMBULATORY_CARE_PROVIDER_SITE_OTHER): Payer: Medicare Other | Admitting: Family Medicine

## 2016-10-27 ENCOUNTER — Encounter: Payer: Self-pay | Admitting: Family Medicine

## 2016-10-27 VITALS — BP 155/83 | HR 59 | Temp 97.7°F | Ht 65.0 in | Wt 129.0 lb

## 2016-10-27 DIAGNOSIS — R300 Dysuria: Secondary | ICD-10-CM | POA: Diagnosis not present

## 2016-10-27 DIAGNOSIS — N3000 Acute cystitis without hematuria: Secondary | ICD-10-CM | POA: Diagnosis not present

## 2016-10-27 LAB — URINALYSIS, COMPLETE
Bilirubin, UA: NEGATIVE
Glucose, UA: NEGATIVE
Ketones, UA: NEGATIVE
LEUKOCYTES UA: NEGATIVE
Nitrite, UA: NEGATIVE
PH UA: 8 — AB (ref 5.0–7.5)
PROTEIN UA: NEGATIVE
RBC, UA: NEGATIVE
Specific Gravity, UA: 1.02 (ref 1.005–1.030)
Urobilinogen, Ur: 0.2 mg/dL (ref 0.2–1.0)

## 2016-10-27 LAB — MICROSCOPIC EXAMINATION
Bacteria, UA: NONE SEEN
EPITHELIAL CELLS (NON RENAL): NONE SEEN /HPF (ref 0–10)
RBC, UA: NONE SEEN /hpf (ref 0–?)
RENAL EPITHEL UA: NONE SEEN /HPF
WBC UA: NONE SEEN /HPF (ref 0–?)

## 2016-10-27 MED ORDER — CIPROFLOXACIN HCL 500 MG PO TABS: 500.0000 mg | ORAL_TABLET | Freq: Two times a day (BID) | ORAL | 0 refills | Status: DC

## 2016-10-27 NOTE — Patient Instructions (Signed)
Como discutimos, he cambiado tu antibitico. Scientist, water quality su viejo antibitico. Comienza el nuevo hoy. lo tomars [employer]. beber abundante agua. Llame al urlogo y vea si puede obtener una cita antes. Si presenta nuseas, vmitos, dolor abdominal, fiebre, escalofros, confusin, empeoramiento de los sntomas urinarios o sangre en la orina, busque atencin mdica inmediata.   Infeccin urinaria en los adultos (Urinary Tract Infection, Adult) Una infeccin urinaria (IU) puede ocurrir en Clinical cytogeneticist de las vas [place]. Las vas urinarias incluyen lo siguiente:  Riones.  Urteres.  Vejiga.  Uretra. Estos rganos fabrican, Buyer, retail y eliminan la orina del organismo. [place] los medicamentos de venta libre y los recetados solamente como se lo haya indicado el mdico.  Si le recetaron un antibitico, tmelo como se lo haya indicado el mdico. No deje de tomar los antibiticos aunque comience a sentirse mejor.  Evite beber lo siguiente: ? Alcohol. ? Cafena. ? T. ? Bebidas con gas.  Beba suficiente lquido para mantener el pis claro o de color amarillo plido.  Concurra a todas las visitas de control como se lo haya indicado el mdico. Esto es importante.  Asegrese de lo siguiente: ? Vaciar la vejiga con frecuencia y en su totalidad. No contener la orina durante largos perodos. ? Vaciar la vejiga antes y despus de Clinical biochemist. ? Limpiar de adelante hacia atrs despus de defecar, si es mujer. Usar cada trozo de papel una vez cuando se limpie. SOLICITE AYUDA SI:  Siente dolor en la espalda.  Tiene fiebre.  Siente malestar estomacal (nuseas).  Vomita.  Los sntomas no mejoran despus de 3das de tratamiento.  Los sntomas desaparecen y Teacher, adult education. SOLICITE AYUDA DE INMEDIATO SI:  Siente un dolor muy intenso en la espalda.  Siente un dolor muy intenso en la parte inferior del abdomen.  Tiene vmitos y no puede  retener los medicamentos ni el agua. Esta informacin no tiene Marine scientist el consejo del mdico. Asegrese de hacerle al mdico cualquier pregunta que tenga. Document Released: 07/06/2009 Document Revised: 05/10/2015 Document Reviewed: 12/07/2014 Elsevier Interactive Patient Education  [employer].

## 2016-10-27 NOTE — Progress Notes (Signed)
   Subjective: Darren Rose symptoms PCP: Darren Rose, Darren Kaufmann, Darren Rose Darren Rose is a 83 y.o. male presenting to clinic today for:  Spanish translation is provided by his daughter, Darren Rose today. Per patient request.  1. Urinary symptoms Patient was seen on 10/19/2016 for urinary symptoms. He reports burning and frequency. He also notes urgency. He was diagnosed with a urinary tract infection and prescribed Septra DS to take for 10 days. His urine culture grew Klebsiella oxytoca which was sensitive to sulfa antibiotics. Today he reports that symptoms seemed to improve for about 3-4 days. Then all of a sudden, he notes that urinary pain returned. He notes that the burning is intermittent but that it does seem worse than before. No fevers, chills, nausea, vomiting, rashes, genital lesions or purulence from his penis. No change in urine color or odor. He has been taking the Septra DS twice a day as directed. He is also been drinking cranberry juice. He reports good fluid intake. He is not sexually active. He does not smoke.  No Known Allergies Past Medical History:  Diagnosis Date  . BPH (benign prostatic hypertrophy)   . Hyperlipidemia   . Hypertension    Family History  Problem Relation Age of Onset  . Diabetes Sister   . Lung cancer Brother   . Diabetes Sister    Social Hx: non smoker.Current medications reviewed.   ROS: Per HPI  Objective: Office vital signs reviewed. BP (!) 155/83   Pulse (!) 59   Temp 97.7 F (36.5 C) (Oral)   Ht 5\' 5"  (1.651 m)   Wt 129 lb (58.5 kg)   BMI 21.47 kg/m   Physical Examination:  General: Awake, alert, well nourished, well appearing male, No acute distress GU: Prostate exam technically difficult secondary to patient's inability to relax. However, what is palpated was nontender and no nodules felt .Sphincter tone normal.  GU exam chaperoned by Barnett Applebaum.  No results found for this or any previous visit (from the past 24  hour(s)).  Assessment/ Plan: 83 y.o. male   1. Acute cystitis without hematuria Urinalysis today actually was negative. He is afebrile. Nothing on exam to suggest systemic infection. His prostate exam was limited. Though there was no tenderness on exam. However, patient has been on an oral antibiotic for the last week. For this reason, urine culture was sent. Because he is still symptomatic, we discussed discontinuing the Septra and starting Cipro. Patient does wish to proceed with this. He has a referral to urology in place. I did advise him to call for sooner appointment if he is having persistent symptoms despite antibiotics. Return precautions and reasons for emergent evaluation were discussed. His daughter provided translation of this visit. Patient will follow-up with his primary care doctor as needed. - Urinalysis, Complete - ciprofloxacin (CIPRO) 500 MG tablet; Take 1 tablet (500 mg total) by mouth 2 (two) times daily.  Dispense: 14 tablet; Refill: 0 - Urine Culture   Orders Placed This Encounter  Procedures  . Urine Culture  . Urinalysis, Complete   Meds ordered this encounter  Medications  . ciprofloxacin (CIPRO) 500 MG tablet    Sig: Take 1 tablet (500 mg total) by mouth 2 (two) times daily.    Dispense:  14 tablet    Refill:  [place], DO [place] [phone]

## 2016-10-29 LAB — URINE CULTURE: Organism ID, Bacteria: NO GROWTH

## 2016-10-30 ENCOUNTER — Ambulatory Visit: Payer: Medicare Other | Admitting: Family Medicine

## 2016-11-18 ENCOUNTER — Other Ambulatory Visit: Payer: Self-pay | Admitting: Family Medicine

## 2016-11-22 ENCOUNTER — Other Ambulatory Visit: Payer: Self-pay | Admitting: Family Medicine

## 2016-11-27 ENCOUNTER — Encounter: Payer: Self-pay | Admitting: Family Medicine

## 2016-11-27 ENCOUNTER — Ambulatory Visit (INDEPENDENT_AMBULATORY_CARE_PROVIDER_SITE_OTHER): Payer: Medicare Other | Admitting: Family Medicine

## 2016-11-27 VITALS — BP 134/70 | HR 67 | Temp 97.2°F | Ht 65.0 in | Wt 136.0 lb

## 2016-11-27 DIAGNOSIS — E785 Hyperlipidemia, unspecified: Secondary | ICD-10-CM | POA: Diagnosis not present

## 2016-11-27 DIAGNOSIS — I10 Essential (primary) hypertension: Secondary | ICD-10-CM | POA: Diagnosis not present

## 2016-11-27 DIAGNOSIS — R7303 Prediabetes: Secondary | ICD-10-CM | POA: Diagnosis not present

## 2016-11-27 LAB — BAYER DCA HB A1C WAIVED: HB A1C: 5.7 % (ref <7.0)

## 2016-11-27 NOTE — Progress Notes (Signed)
BP 134/70   Pulse 67   Temp (!) 97.2 F (36.2 C) (Oral)   Ht 5' 5"  (1.651 m)   Wt 136 lb (61.7 kg)   BMI 22.63 kg/m    Subjective:    Patient ID: Darren Rose, male    DOB: 20-Jun-1931, 83 y.o.   MRN: [id number]  HPI: Darren Rose is a 83 y.o. male presenting on 11/27/2016 for Hyperlipidemia (follow up) and Hypertension   HPI Hyperlipidemia Patient is coming in for recheck of his hyperlipidemia. The patient is currently taking Lipitor. They deny any issues with myalgias or history of liver damage from it. They deny any focal numbness or weakness or chest pain.   Hypertension Patient is currently on hydrochlorothiazide and lisinopril, and their blood pressure today is 134/70. Patient denies any lightheadedness or dizziness. Patient denies headaches, blurred vision, chest pains, shortness of breath, or weakness. Denies any side effects from medication and is content with current medication.   Prediabetes Patient comes in today for recheck of his diabetes. Patient has been currently taking no medication but is trying to do it with diet.  His A1c's have been controlled to this point.. Patient is currently on an ACE inhibitor/ARB. Patient has not seen an ophthalmologist this year. Patient denies any issues with their feet.   Relevant past medical, surgical, family and social history reviewed and updated as indicated. Interim medical history since our last visit reviewed. Allergies and medications reviewed and updated.  Review of Systems  Constitutional: Negative for chills and fever.  Eyes: Negative for discharge.  Respiratory: Negative for shortness of breath and wheezing.   Cardiovascular: Negative for chest pain and leg swelling.  Musculoskeletal: Negative for back pain and gait problem.  Skin: Negative for rash.  Neurological: Negative for dizziness, weakness, light-headedness, numbness and headaches.  All other systems reviewed and are negative.   Per HPI unless  specifically indicated above        Objective:    BP 134/70   Pulse 67   Temp (!) 97.2 F (36.2 C) (Oral)   Ht 5' 5"  (1.651 m)   Wt 136 lb (61.7 kg)   BMI 22.63 kg/m   Wt Readings from Last 3 Encounters:  11/27/16 136 lb (61.7 kg)  10/27/16 129 lb (58.5 kg)  10/19/16 133 lb (60.3 kg)    Physical Exam  Constitutional: He is oriented to person, place, and time. He appears well-developed and well-nourished. No distress.  Eyes: Conjunctivae are normal. No scleral icterus.  Neck: Neck supple. No thyromegaly present.  Cardiovascular: Normal rate, regular rhythm, normal heart sounds and intact distal pulses.   No murmur heard. Pulmonary/Chest: Effort normal and breath sounds normal. No respiratory distress. He has no wheezes. He has no rales.  Musculoskeletal: Normal range of motion. He exhibits no edema.  Lymphadenopathy:    He has no cervical adenopathy.  Neurological: He is alert and oriented to person, place, and time. Coordination normal.  Skin: Skin is warm and dry. No rash noted. He is not diaphoretic.  Psychiatric: He has a normal mood and affect. His behavior is normal.  Nursing note and vitals reviewed.       Assessment & Plan:   Problem List Items Addressed This Visit      Cardiovascular and Mediastinum   Essential hypertension, benign   Relevant Orders   CMP14+EGFR (Completed)     Other   Hyperlipidemia with target LDL less than 100 - Primary   Relevant Orders   Lipid  panel (Completed)   Prediabetes   Relevant Orders   Bayer DCA Hb A1c Waived (Completed)   CMP14+EGFR (Completed)       Follow up plan: Return in about 6 months (around 05/28/2017), or if symptoms worsen or fail to improve, for Recheck hypertension and cholesterol.  Counseling provided for all of the vaccine components Orders Placed This Encounter  Procedures  . Bayer DCA Hb A1c Waived  . CMP14+EGFR  . Lipid panel    Caryl Pina, MD [facility] 11/27/2016, 11:01 AM

## 2016-11-28 LAB — LIPID PANEL
CHOL/HDL RATIO: 2.8 ratio (ref 0.0–5.0)
Cholesterol, Total: 121 mg/dL (ref 100–199)
HDL: 43 mg/dL (ref >39)
LDL CALC: 63 mg/dL (ref 0–99)
TRIGLYCERIDES: 73 mg/dL (ref 0–149)
VLDL Cholesterol Cal: 15 mg/dL (ref 5–40)

## 2016-11-28 LAB — CMP14+EGFR
ALBUMIN: 4 g/dL (ref 3.5–4.7)
ALT: 16 IU/L (ref 0–44)
AST: 21 IU/L (ref 0–40)
Albumin/Globulin Ratio: 1.6 (ref 1.2–2.2)
Alkaline Phosphatase: 77 IU/L (ref 39–117)
BUN/Creatinine Ratio: 23 (ref 10–24)
BUN: 18 mg/dL (ref 8–27)
Bilirubin Total: 0.6 mg/dL (ref 0.0–1.2)
CALCIUM: 9.2 mg/dL (ref 8.6–10.2)
CO2: 27 mmol/L (ref 20–29)
CREATININE: 0.77 mg/dL (ref 0.76–1.27)
Chloride: 98 mmol/L (ref 96–106)
GFR calc Af Amer: 97 mL/min/{1.73_m2} (ref >59)
GFR, EST NON AFRICAN AMERICAN: 84 mL/min/{1.73_m2} (ref >59)
GLOBULIN, TOTAL: 2.5 g/dL (ref 1.5–4.5)
Glucose: 93 mg/dL (ref 65–99)
Potassium: 4 mmol/L (ref 3.5–5.2)
SODIUM: 141 mmol/L (ref 134–144)
TOTAL PROTEIN: 6.5 g/dL (ref 6.0–8.5)

## 2016-12-04 ENCOUNTER — Other Ambulatory Visit: Payer: Self-pay | Admitting: Family Medicine

## 2016-12-04 DIAGNOSIS — I10 Essential (primary) hypertension: Secondary | ICD-10-CM

## 2016-12-07 DIAGNOSIS — L308 Other specified dermatitis: Secondary | ICD-10-CM | POA: Diagnosis not present

## 2016-12-20 ENCOUNTER — Ambulatory Visit (INDEPENDENT_AMBULATORY_CARE_PROVIDER_SITE_OTHER): Payer: Medicare Other | Admitting: Urology

## 2016-12-20 DIAGNOSIS — R3915 Urgency of urination: Secondary | ICD-10-CM | POA: Diagnosis not present

## 2016-12-20 DIAGNOSIS — R351 Nocturia: Secondary | ICD-10-CM | POA: Diagnosis not present

## 2016-12-20 DIAGNOSIS — N401 Enlarged prostate with lower urinary tract symptoms: Secondary | ICD-10-CM | POA: Diagnosis not present

## 2017-01-14 ENCOUNTER — Other Ambulatory Visit: Payer: Self-pay | Admitting: Family Medicine

## 2017-01-14 DIAGNOSIS — E785 Hyperlipidemia, unspecified: Secondary | ICD-10-CM

## 2017-01-24 ENCOUNTER — Other Ambulatory Visit: Payer: Self-pay | Admitting: Family Medicine

## 2017-01-24 NOTE — Telephone Encounter (Signed)
Patient has one refill left on Lisinopril

## 2017-01-24 NOTE — Telephone Encounter (Signed)
What is the name of the medication?   lisinopril (PRINIVIL,ZESTRIL) 40 MG tablet     Have you contacted your pharmacy to request a refill? Yes    Which pharmacy would you like this sent to? [facility]   Patient notified that their request is being sent to the clinical staff for review and that they should receive a call once it is complete. If they do not receive a call within 24 hours they can check with their pharmacy or our office.

## 2017-01-31 ENCOUNTER — Ambulatory Visit (INDEPENDENT_AMBULATORY_CARE_PROVIDER_SITE_OTHER): Payer: Medicare Other | Admitting: Urology

## 2017-01-31 DIAGNOSIS — R3915 Urgency of urination: Secondary | ICD-10-CM

## 2017-01-31 DIAGNOSIS — N401 Enlarged prostate with lower urinary tract symptoms: Secondary | ICD-10-CM

## 2017-01-31 DIAGNOSIS — R351 Nocturia: Secondary | ICD-10-CM | POA: Diagnosis not present

## 2017-02-05 ENCOUNTER — Ambulatory Visit: Payer: Medicare Other

## 2017-02-06 ENCOUNTER — Ambulatory Visit (INDEPENDENT_AMBULATORY_CARE_PROVIDER_SITE_OTHER): Payer: Medicare Other | Admitting: Family Medicine

## 2017-02-06 ENCOUNTER — Encounter: Payer: Self-pay | Admitting: Family Medicine

## 2017-02-06 VITALS — BP 167/74 | HR 57 | Temp 97.9°F | Ht 65.0 in | Wt 134.0 lb

## 2017-02-06 DIAGNOSIS — M67912 Unspecified disorder of synovium and tendon, left shoulder: Secondary | ICD-10-CM | POA: Diagnosis not present

## 2017-02-06 MED ORDER — METHYLPREDNISOLONE ACETATE 80 MG/ML IJ SUSP
80.0000 mg | Freq: Once | INTRAMUSCULAR | Status: AC
  Administered 2017-02-06: 80 mg via INTRAMUSCULAR

## 2017-02-06 NOTE — Progress Notes (Signed)
BP (!) 167/74   Pulse (!) 57   Temp 97.9 F (36.6 C) (Oral)   Ht 5\' 5"  (1.651 m)   Wt 134 lb (60.8 kg)   BMI 22.30 kg/m    Subjective:    Patient ID: Darren Rose, male    DOB: [birth year], 84 y.o.   MRN: [id number]  HPI: Darren Rose is a 84 y.o. male presenting on 02/06/2017 for Left shoulder pain (x 10 days, no known injury)   HPI Left shoulder pain Patient has been having left shoulder pain for the past week and a half.  He does not know of any specific injury that he had but he does have a lot more pain with trying to go overhead range of motion.  He denies any fevers or chills or redness or warmth.  He says the pain is on the lateral aspect of his left shoulder and is worse with behind his body and overhead range of motion.  He denies any numbness but does have some weakness in the arm especially with abduction and internal rotation.  He has not used any over-the-counter medication for this just yet  Relevant past medical, surgical, family and social history reviewed and updated as indicated. Interim medical history since our last visit reviewed. Allergies and medications reviewed and updated.  Review of Systems  Constitutional: Negative for chills and fever.  Respiratory: Negative for shortness of breath and wheezing.   Cardiovascular: Negative for chest pain and leg swelling.  Musculoskeletal: Positive for arthralgias. Negative for back pain and gait problem.  Skin: Negative for color change and rash.  All other systems reviewed and are negative.   Per HPI unless specifically indicated above     Objective:    BP (!) 167/74   Pulse (!) 57   Temp 97.9 F (36.6 C) (Oral)   Ht 5\' 5"  (1.651 m)   Wt 134 lb (60.8 kg)   BMI 22.30 kg/m   Wt Readings from Last 3 Encounters:  02/06/17 134 lb (60.8 kg)  11/27/16 136 lb (61.7 kg)  10/27/16 129 lb (58.5 kg)    Physical Exam  Constitutional: He is oriented to person, place, and time. He appears well-developed and  well-nourished. No distress.  Eyes: Conjunctivae are normal. No scleral icterus.  Musculoskeletal: Normal range of motion. He exhibits tenderness (Positive tenderness on the lateral aspect of his left shoulder, positive for pain and weakness with internal rotation and positive for pain and weakness with abduction and positive for weakness with empty can testing). He exhibits no edema.  Neurological: He is alert and oriented to person, place, and time. Coordination normal.  Skin: Skin is warm and dry. No rash noted. He is not diaphoretic.  Psychiatric: He has a normal mood and affect. His behavior is normal.  Nursing note and vitals reviewed.   Left subacromial injection: Consent form signed. Risk factors of bleeding and infection discussed with patient and patient is agreeable towards injection. Patient prepped with Betadine. Lateral approach towards injection used. Injected 80 mg of Depo-Medrol and 1 mL of 2% lidocaine. Patient tolerated procedure well and no side effects from noted. Minimal to no bleeding. Simple bandage applied after.     Assessment & Plan:   Problem List Items Addressed This Visit    None    Visit Diagnoses    Tendinopathy of left rotator cuff    -  Primary   Relevant Medications   methylPREDNISolone acetate (DEPO-MEDROL) injection 80 mg (Start on 02/06/2017 11:15  AM)       Follow up plan: Return if symptoms worsen or fail to improve.  Counseling provided for all of the vaccine components No orders of the defined types were placed in this encounter.   Caryl Pina, MD [facility] 02/06/2017, 11:10 AM

## 2017-02-27 ENCOUNTER — Telehealth: Payer: Self-pay | Admitting: Family Medicine

## 2017-02-27 ENCOUNTER — Other Ambulatory Visit: Payer: Self-pay | Admitting: Family Medicine

## 2017-02-27 DIAGNOSIS — M545 Low back pain, unspecified: Secondary | ICD-10-CM

## 2017-02-27 DIAGNOSIS — G8929 Other chronic pain: Secondary | ICD-10-CM

## 2017-02-27 DIAGNOSIS — M12819 Other specific arthropathies, not elsewhere classified, unspecified shoulder: Secondary | ICD-10-CM

## 2017-02-28 NOTE — Telephone Encounter (Signed)
Will go ahead and send to [place], MD [place] 02/28/2017, 8:28 AM

## 2017-03-01 ENCOUNTER — Encounter (INDEPENDENT_AMBULATORY_CARE_PROVIDER_SITE_OTHER): Payer: Self-pay | Admitting: Orthopaedic Surgery

## 2017-03-01 ENCOUNTER — Ambulatory Visit (INDEPENDENT_AMBULATORY_CARE_PROVIDER_SITE_OTHER): Payer: Medicare Other | Admitting: Orthopaedic Surgery

## 2017-03-01 ENCOUNTER — Ambulatory Visit (INDEPENDENT_AMBULATORY_CARE_PROVIDER_SITE_OTHER): Payer: Medicare Other

## 2017-03-01 VITALS — BP 139/80 | HR 61 | Ht 64.0 in | Wt 135.0 lb

## 2017-03-01 DIAGNOSIS — M25512 Pain in left shoulder: Secondary | ICD-10-CM

## 2017-03-01 DIAGNOSIS — M19012 Primary osteoarthritis, left shoulder: Secondary | ICD-10-CM

## 2017-03-01 MED ORDER — LIDOCAINE HCL 1 % IJ SOLN
0.5000 mL | INTRAMUSCULAR | Status: AC | PRN
  Administered 2017-03-01: .5 mL

## 2017-03-01 MED ORDER — BUPIVACAINE HCL 0.25 % IJ SOLN
4.0000 mL | INTRAMUSCULAR | Status: AC | PRN
  Administered 2017-03-01: 4 mL via INTRA_ARTICULAR

## 2017-03-01 MED ORDER — METHYLPREDNISOLONE ACETATE 40 MG/ML IJ SUSP
40.0000 mg | INTRAMUSCULAR | Status: AC | PRN
  Administered 2017-03-01: 40 mg via INTRA_ARTICULAR
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
## 2017-03-01 NOTE — Telephone Encounter (Signed)
Pt has ortho appt today

## 2017-03-14 ENCOUNTER — Ambulatory Visit (INDEPENDENT_AMBULATORY_CARE_PROVIDER_SITE_OTHER): Payer: Medicare Other | Admitting: Urology

## 2017-03-14 DIAGNOSIS — R351 Nocturia: Secondary | ICD-10-CM

## 2017-03-14 DIAGNOSIS — R3915 Urgency of urination: Secondary | ICD-10-CM

## 2017-03-14 DIAGNOSIS — N401 Enlarged prostate with lower urinary tract symptoms: Secondary | ICD-10-CM | POA: Diagnosis not present

## 2017-04-16 ENCOUNTER — Other Ambulatory Visit: Payer: Self-pay | Admitting: *Deleted

## 2017-04-16 DIAGNOSIS — E785 Hyperlipidemia, unspecified: Secondary | ICD-10-CM

## 2017-04-16 MED ORDER — ATORVASTATIN CALCIUM 40 MG PO TABS: ORAL_TABLET | ORAL | 0 refills | Status: DC

## 2017-05-02 IMAGING — CR DG CERVICAL SPINE COMPLETE 4+V
6 series · 6 of 6 positions shown · non-contrast
Comparison: None

CLINICAL DATA: Neck pain

EXAM:
CERVICAL SPINE  4+ VIEWS

[view not recorded (1 of 6)]
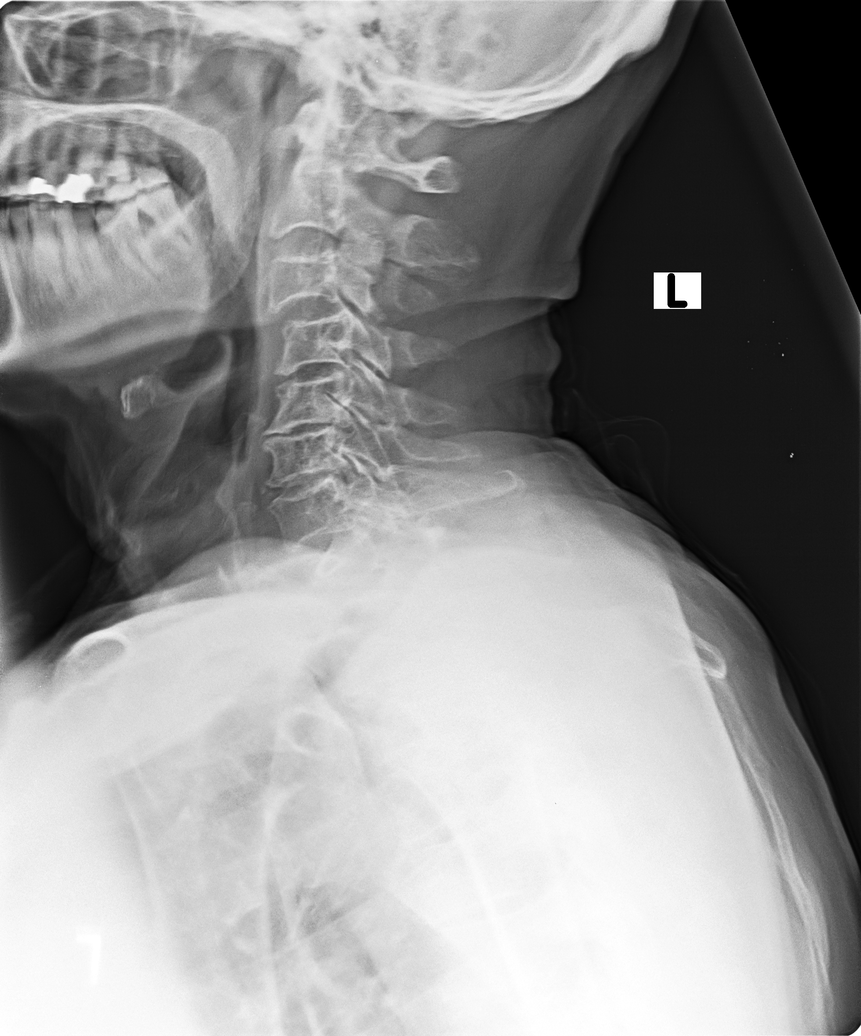

[view not recorded (2 of 6)]
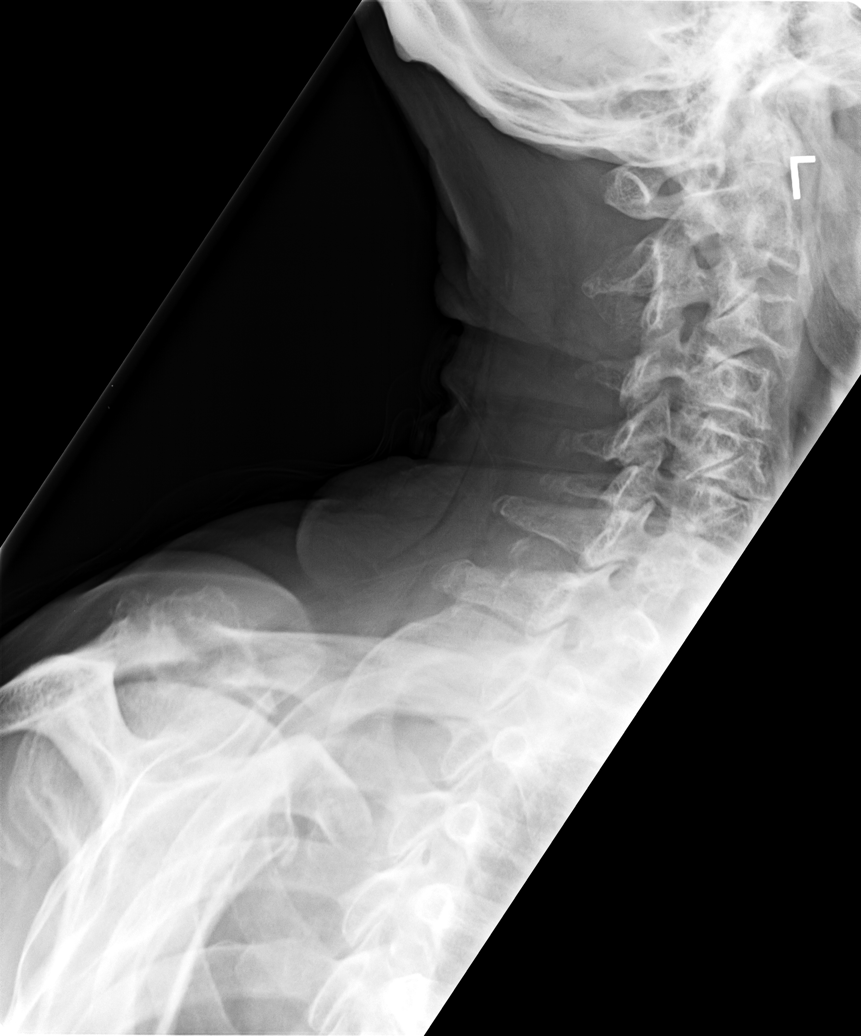

[view not recorded (3 of 6)]
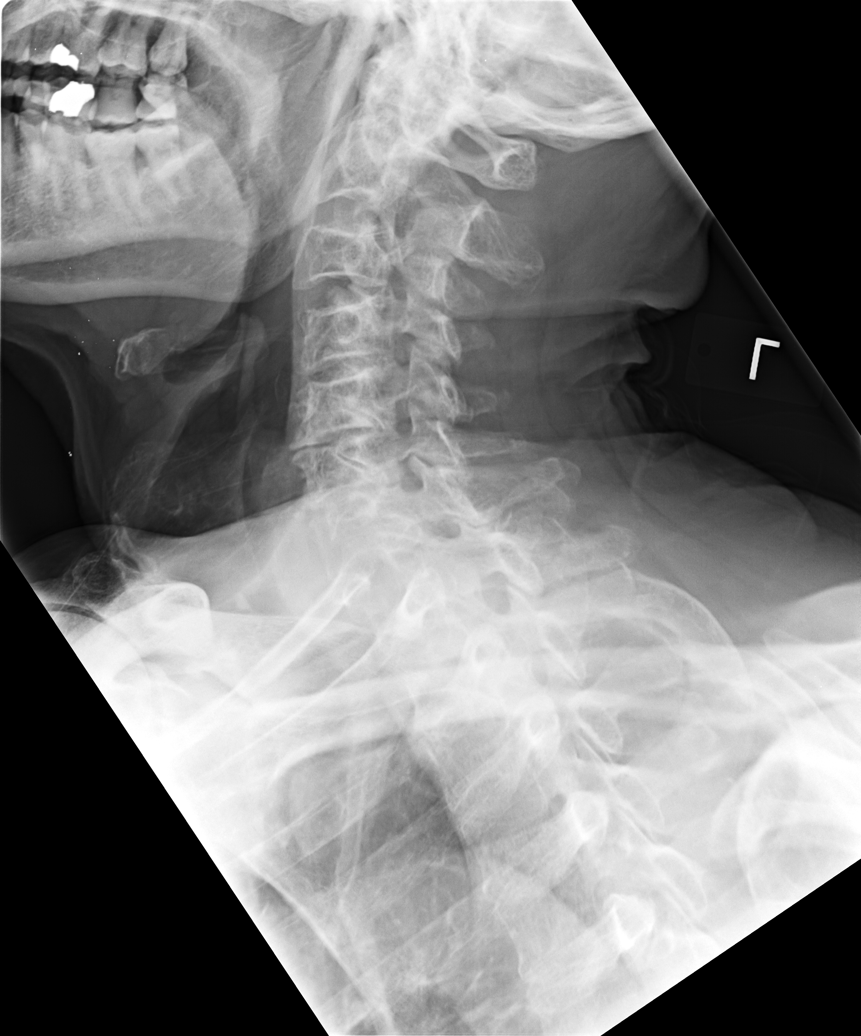

[view not recorded (4 of 6)]
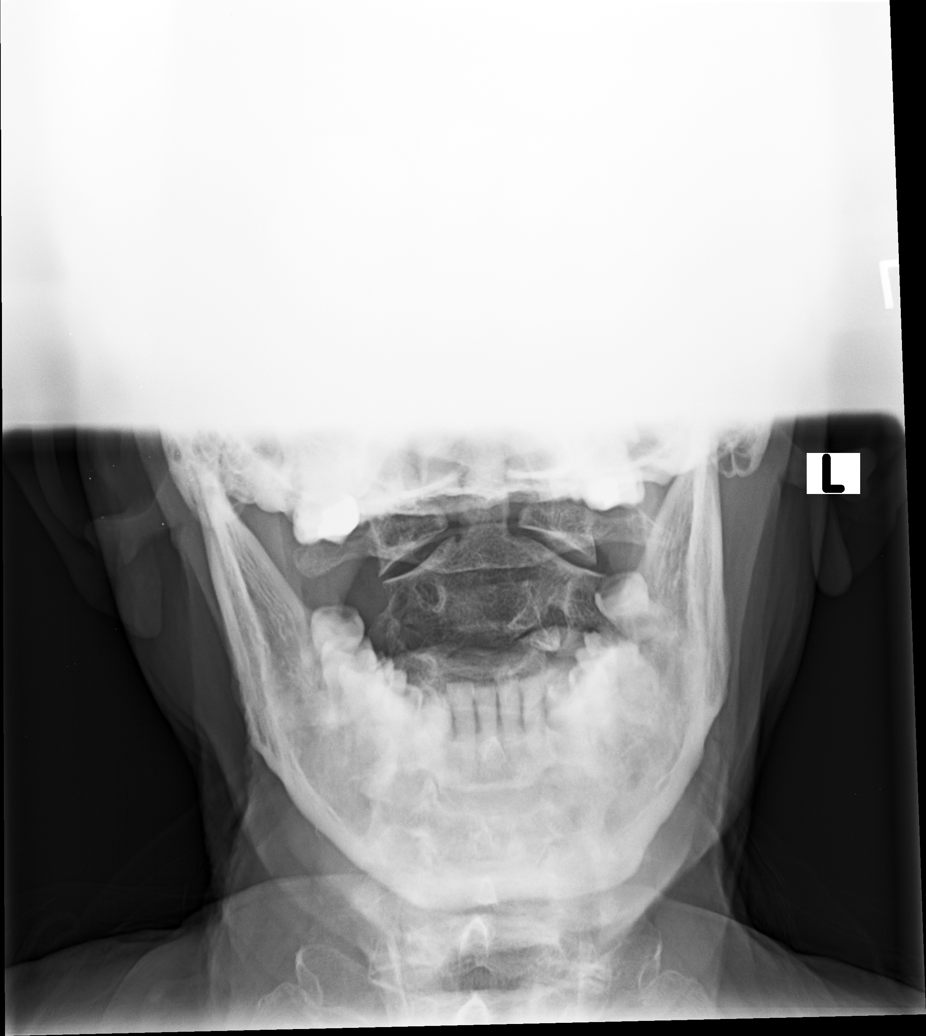

[view not recorded (5 of 6)]
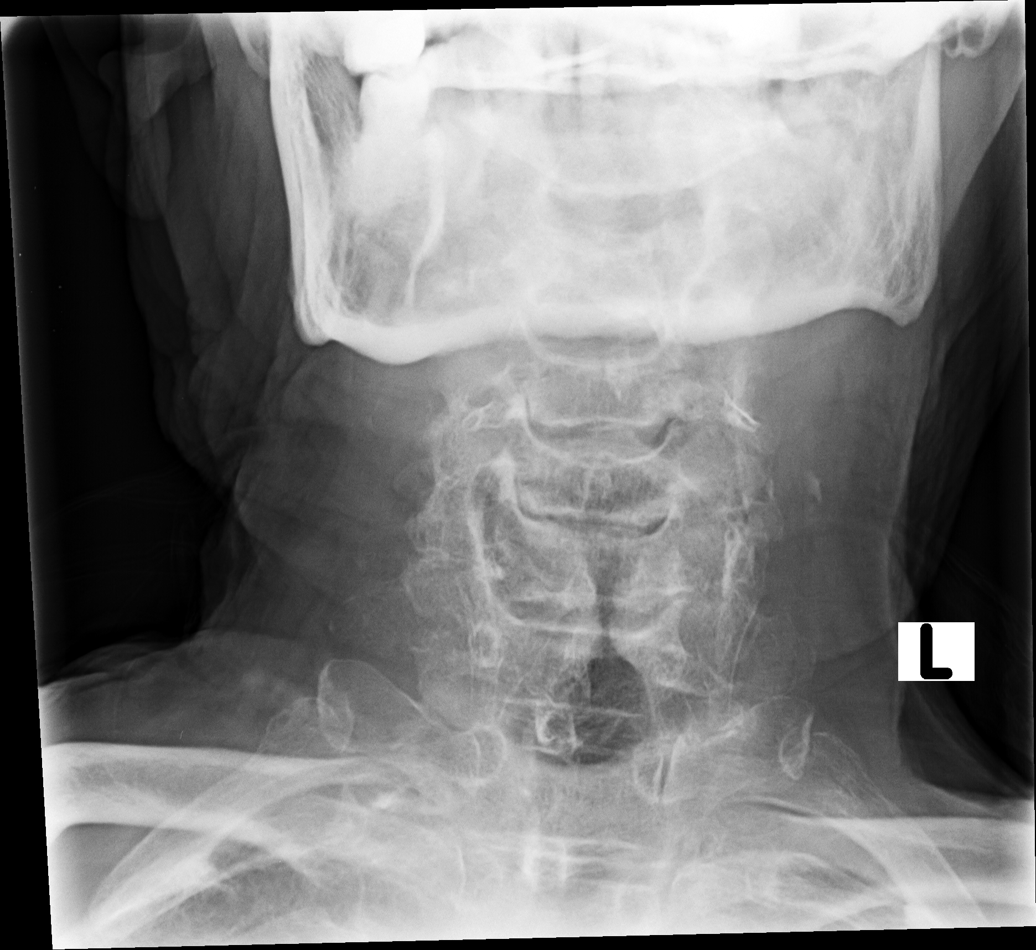

[view not recorded (6 of 6)]
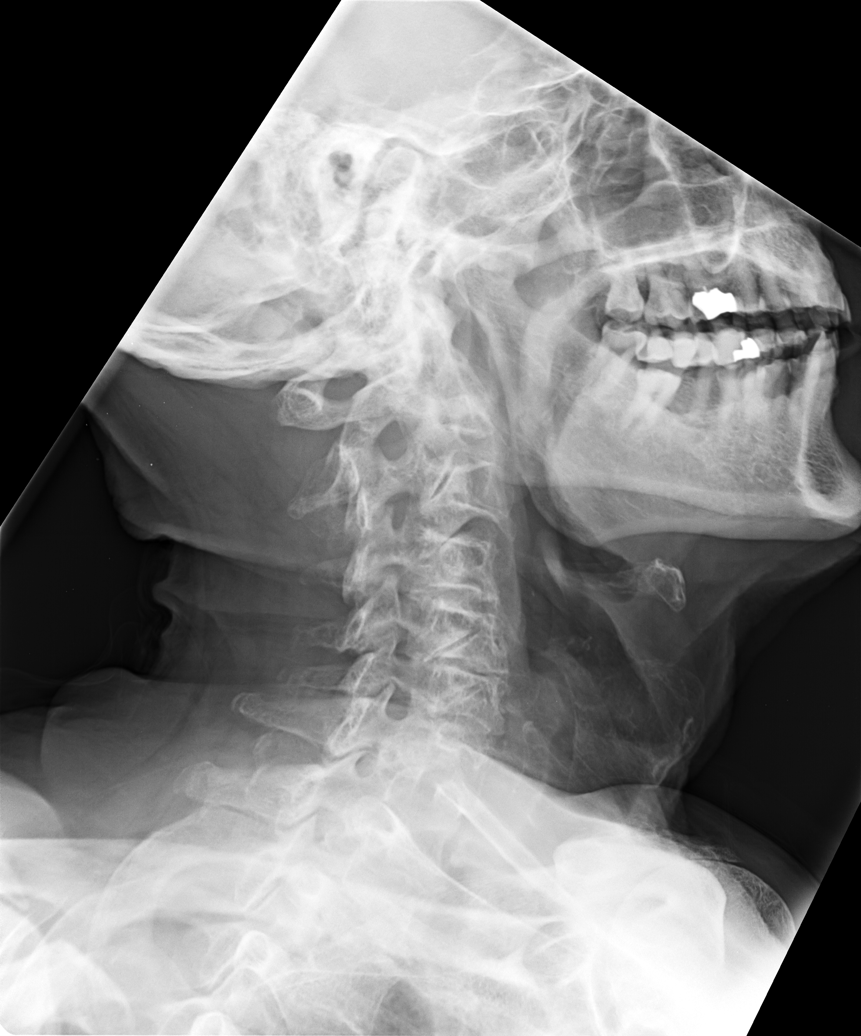

[6 of 6 positions shown; findings below may reference images not displayed]

FINDINGS: The cervical vertebral body heights are well preserved. The facet
joints are aligned. The prevertebral soft tissue space is normal.
Multi level disc space narrowing and ventral endplate spurring is
noted. This is most advanced at C5-6. There is an anterolisthesis of
C3 on C4 which is likely related to spondylosis. No fracture or
subluxation noted. There is evidence of bilateral neuroforaminal
narrowing likely due to posterior spur formation.
IMPRESSION: 1. Moderate to advanced cervical spondylosis.

## 2017-05-17 DIAGNOSIS — Z961 Presence of intraocular lens: Secondary | ICD-10-CM | POA: Diagnosis not present

## 2017-05-30 ENCOUNTER — Ambulatory Visit: Payer: Medicare Other | Admitting: Family Medicine

## 2017-05-31 ENCOUNTER — Encounter: Payer: Self-pay | Admitting: Family Medicine

## 2017-05-31 ENCOUNTER — Ambulatory Visit (INDEPENDENT_AMBULATORY_CARE_PROVIDER_SITE_OTHER): Payer: Medicare Other | Admitting: Family Medicine

## 2017-05-31 VITALS — BP 120/61 | HR 58 | Ht 64.0 in | Wt 133.0 lb

## 2017-05-31 DIAGNOSIS — G8929 Other chronic pain: Secondary | ICD-10-CM

## 2017-05-31 DIAGNOSIS — I10 Essential (primary) hypertension: Secondary | ICD-10-CM | POA: Diagnosis not present

## 2017-05-31 DIAGNOSIS — J302 Other seasonal allergic rhinitis: Secondary | ICD-10-CM

## 2017-05-31 DIAGNOSIS — M545 Low back pain, unspecified: Secondary | ICD-10-CM

## 2017-05-31 DIAGNOSIS — R7303 Prediabetes: Secondary | ICD-10-CM

## 2017-05-31 DIAGNOSIS — E785 Hyperlipidemia, unspecified: Secondary | ICD-10-CM

## 2017-05-31 LAB — BAYER DCA HB A1C WAIVED: HB A1C (BAYER DCA - WAIVED): 5.7 % (ref <7.0)

## 2017-05-31 MED ORDER — CETIRIZINE HCL 10 MG PO TABS: 10.0000 mg | ORAL_TABLET | Freq: Every day | ORAL | 11 refills | Status: DC

## 2017-05-31 MED ORDER — CYCLOBENZAPRINE HCL 10 MG PO TABS: ORAL_TABLET | ORAL | 3 refills | Status: DC

## 2017-05-31 NOTE — Progress Notes (Signed)
BP 120/61   Pulse (!) 58   Ht 5' 4"  (1.626 m)   Wt 133 lb (60.3 kg)   BMI 22.83 kg/m    Subjective:    Patient ID: Darren Rose, male    DOB: Jun 12, 1931, 84 y.o.   MRN: [id number]  HPI: Castiel Lauricella is a 84 y.o. male presenting on 05/31/2017 for Hyperlipidemia; Hypertension; and Follow-up (pre diabetes )   HPI Hypertension Patient is currently on lisinopril and hydrochlorothiazide, and their blood pressure today is 120/61. Patient denies any lightheadedness or dizziness. Patient denies headaches, blurred vision, chest pains, shortness of breath, or weakness. Denies any side effects from medication and is content with current medication.   Prediabetes Patient comes in today for recheck of his diabetes. Patient has been currently has been diet controlled and not taking anything. Patient is currently on an ACE inhibitor/ARB. Patient has not seen an ophthalmologist this year. Patient denies any issues with their feet.   Hyperlipidemia Patient is coming in for recheck of his hyperlipidemia. The patient is currently taking Lipitor. They deny any issues with myalgias or history of liver damage from it. They deny any focal numbness or weakness or chest pain.   Seasonal allergies, Patient wants to try something for seasonal allergies, he is never had them before but over this past month he has been getting some congestion and seasonal allergies and wants to try medicine for that.  He denies any shortness of breath or wheezing or fevers or chills just mainly has nasal congestion and pressure.  Patient wants a refill of his Flexeril which he only uses very infrequently for his back and neck pain and it has been helping greatly.  Relevant past medical, surgical, family and social history reviewed and updated as indicated. Interim medical history since our last visit reviewed. Allergies and medications reviewed and updated.  Review of Systems  Constitutional: Negative for chills and  fever.  HENT: Positive for congestion, postnasal drip, rhinorrhea and sneezing. Negative for sinus pressure, sinus pain and sore throat.   Eyes: Negative for discharge.  Respiratory: Negative for cough, shortness of breath and wheezing.   Cardiovascular: Negative for chest pain and leg swelling.  Musculoskeletal: Negative for back pain and gait problem.  Skin: Negative for rash.  Neurological: Negative for dizziness, weakness, light-headedness and numbness.  All other systems reviewed and are negative.   Per HPI unless specifically indicated above   Allergies as of 05/31/2017   No Known Allergies     Medication List        Accurate as of 05/31/17 11:00 AM. Always use your most recent med list.          alfuzosin 10 MG 24 hr tablet Commonly known as:  UROXATRAL Take 10 mg by mouth daily.   aspirin EC 81 MG tablet Take 81 mg by mouth daily.   atorvastatin 40 MG tablet Commonly known as:  LIPITOR TAKE 1 TABLET (40 MG TOTAL) BY MOUTH DAILY AT 6 PM.   cetirizine 10 MG tablet Commonly known as:  ZYRTEC Take 1 tablet (10 mg total) by mouth daily.   cyclobenzaprine 10 MG tablet Commonly known as:  FLEXERIL TOME UNA TABLETA POR VIA ORAL TRES VECES AL DIA CUANDO SEA NECESARIO FOR MUSCLE SPASMS   finasteride 5 MG tablet Commonly known as:  PROSCAR Take 5 mg by mouth daily.   hydrochlorothiazide 25 MG tablet Commonly known as:  HYDRODIURIL TAKE 1 TABLET (25 MG TOTAL) BY MOUTH DAILY.   lisinopril  40 MG tablet Commonly known as:  PRINIVIL,ZESTRIL TAKE 1 TABLET (40 MG TOTAL) BY MOUTH DAILY.   MYRBETRIQ 50 MG Tb24 tablet Generic drug:  mirabegron ER TOME UNA TABLETA TODOS LOS D?AS          Objective:    BP 120/61   Pulse (!) 58   Ht 5' 4"  (1.626 m)   Wt 133 lb (60.3 kg)   BMI 22.83 kg/m   Wt Readings from Last 3 Encounters:  05/31/17 133 lb (60.3 kg)  03/01/17 135 lb (61.2 kg)  02/06/17 134 lb (60.8 kg)    Physical Exam  Constitutional: He is oriented to  person, place, and time. He appears well-developed and well-nourished. No distress.  Eyes: Conjunctivae are normal. No scleral icterus.  Neck: Neck supple. No thyromegaly present.  Cardiovascular: Normal rate, regular rhythm, normal heart sounds and intact distal pulses.  No murmur heard. Pulmonary/Chest: Effort normal and breath sounds normal. No respiratory distress. He has no wheezes.  Musculoskeletal: Normal range of motion. He exhibits no edema.  Lymphadenopathy:    He has no cervical adenopathy.  Neurological: He is alert and oriented to person, place, and time. Coordination normal.  Skin: Skin is warm and dry. No rash noted. He is not diaphoretic.  Psychiatric: He has a normal mood and affect. His behavior is normal.  Nursing note and vitals reviewed.       Assessment & Plan:   Problem List Items Addressed This Visit      Cardiovascular and Mediastinum   Essential hypertension, benign   Relevant Orders   CMP14+EGFR     Other   Hyperlipidemia with target LDL less than 100   Relevant Orders   Lipid panel   Prediabetes - Primary   Relevant Orders   Bayer DCA Hb A1c Waived   CMP14+EGFR    Other Visit Diagnoses    Seasonal allergies       Chronic left-sided low back pain without sciatica       Been going on for 2 months, will send a muscle relaxer   Relevant Medications   cyclobenzaprine (FLEXERIL) 10 MG tablet       Follow up plan: Return in about 6 months (around 12/01/2017), or if symptoms worsen or fail to improve, for Prediabetes and hypertension recheck.  Counseling provided for all of the vaccine components Orders Placed This Encounter  Procedures  . Bayer DCA Hb A1c Waived  . CMP14+EGFR  . Lipid panel    Caryl Pina, MD [facility] 05/31/2017, 11:00 AM

## 2017-06-01 LAB — CMP14+EGFR
A/G RATIO: 1.5 (ref 1.2–2.2)
ALBUMIN: 3.7 g/dL (ref 3.5–4.7)
ALT: 16 IU/L (ref 0–44)
AST: 21 IU/L (ref 0–40)
Alkaline Phosphatase: 76 IU/L (ref 39–117)
BUN / CREAT RATIO: 35 — AB (ref 10–24)
BUN: 25 mg/dL (ref 8–27)
Bilirubin Total: 0.5 mg/dL (ref 0.0–1.2)
CALCIUM: 8.9 mg/dL (ref 8.6–10.2)
CO2: 28 mmol/L (ref 20–29)
Chloride: 104 mmol/L (ref 96–106)
Creatinine, Ser: 0.72 mg/dL — ABNORMAL LOW (ref 0.76–1.27)
GFR, EST AFRICAN AMERICAN: 99 mL/min/{1.73_m2} (ref >59)
GFR, EST NON AFRICAN AMERICAN: 86 mL/min/{1.73_m2} (ref >59)
GLOBULIN, TOTAL: 2.4 g/dL (ref 1.5–4.5)
Glucose: 111 mg/dL — ABNORMAL HIGH (ref 65–99)
POTASSIUM: 3.6 mmol/L (ref 3.5–5.2)
Sodium: 145 mmol/L — ABNORMAL HIGH (ref 134–144)
TOTAL PROTEIN: 6.1 g/dL (ref 6.0–8.5)

## 2017-06-01 LAB — LIPID PANEL
CHOL/HDL RATIO: 2.5 ratio (ref 0.0–5.0)
Cholesterol, Total: 106 mg/dL (ref 100–199)
HDL: 42 mg/dL (ref >39)
LDL Calculated: 52 mg/dL (ref 0–99)
Triglycerides: 60 mg/dL (ref 0–149)
VLDL Cholesterol Cal: 12 mg/dL (ref 5–40)

## 2017-06-18 ENCOUNTER — Other Ambulatory Visit: Payer: Self-pay | Admitting: Family Medicine

## 2017-06-20 ENCOUNTER — Other Ambulatory Visit: Payer: Self-pay | Admitting: Family Medicine

## 2017-06-20 DIAGNOSIS — I10 Essential (primary) hypertension: Secondary | ICD-10-CM

## 2017-07-14 ENCOUNTER — Other Ambulatory Visit: Payer: Self-pay | Admitting: Family Medicine

## 2017-07-14 DIAGNOSIS — E785 Hyperlipidemia, unspecified: Secondary | ICD-10-CM

## 2017-08-30 ENCOUNTER — Telehealth: Payer: Self-pay | Admitting: Family Medicine

## 2017-10-03 ENCOUNTER — Ambulatory Visit (INDEPENDENT_AMBULATORY_CARE_PROVIDER_SITE_OTHER): Payer: Medicare Other | Admitting: Urology

## 2017-10-03 DIAGNOSIS — N401 Enlarged prostate with lower urinary tract symptoms: Secondary | ICD-10-CM | POA: Diagnosis not present

## 2017-10-03 DIAGNOSIS — R3915 Urgency of urination: Secondary | ICD-10-CM | POA: Diagnosis not present

## 2017-10-03 DIAGNOSIS — R351 Nocturia: Secondary | ICD-10-CM | POA: Diagnosis not present

## 2017-11-16 ENCOUNTER — Ambulatory Visit (INDEPENDENT_AMBULATORY_CARE_PROVIDER_SITE_OTHER): Payer: Medicare Other

## 2017-11-16 DIAGNOSIS — Z23 Encounter for immunization: Secondary | ICD-10-CM | POA: Diagnosis not present

## 2017-11-17 ENCOUNTER — Other Ambulatory Visit: Payer: Self-pay | Admitting: Family Medicine

## 2017-11-19 NOTE — Telephone Encounter (Signed)
Last seen 5/19 Dr Dettinger

## 2017-12-03 ENCOUNTER — Ambulatory Visit (INDEPENDENT_AMBULATORY_CARE_PROVIDER_SITE_OTHER): Payer: Medicare Other | Admitting: Family Medicine

## 2017-12-03 ENCOUNTER — Encounter: Payer: Self-pay | Admitting: Family Medicine

## 2017-12-03 VITALS — BP 170/92 | HR 59 | Temp 97.2°F | Ht 64.0 in | Wt 134.2 lb

## 2017-12-03 DIAGNOSIS — I10 Essential (primary) hypertension: Secondary | ICD-10-CM | POA: Diagnosis not present

## 2017-12-03 DIAGNOSIS — R7303 Prediabetes: Secondary | ICD-10-CM | POA: Diagnosis not present

## 2017-12-03 DIAGNOSIS — N400 Enlarged prostate without lower urinary tract symptoms: Secondary | ICD-10-CM | POA: Diagnosis not present

## 2017-12-03 DIAGNOSIS — E785 Hyperlipidemia, unspecified: Secondary | ICD-10-CM | POA: Diagnosis not present

## 2017-12-03 LAB — BAYER DCA HB A1C WAIVED: HB A1C: 5.5 % (ref <7.0)

## 2017-12-03 MED ORDER — DICLOFENAC SODIUM 1 % TD GEL: 2.0000 g | Freq: Four times a day (QID) | TRANSDERMAL | 5 refills | Status: DC

## 2017-12-03 MED ORDER — FINASTERIDE 5 MG PO TABS: 5.0000 mg | ORAL_TABLET | Freq: Every day | ORAL | 3 refills | Status: DC

## 2017-12-03 MED ORDER — HYDROCHLOROTHIAZIDE 25 MG PO TABS: ORAL_TABLET | ORAL | 3 refills | Status: DC

## 2017-12-03 MED ORDER — ATORVASTATIN CALCIUM 40 MG PO TABS: 40.0000 mg | ORAL_TABLET | Freq: Every day | ORAL | 3 refills | Status: DC

## 2017-12-03 MED ORDER — ALFUZOSIN HCL ER 10 MG PO TB24: 10.0000 mg | ORAL_TABLET | Freq: Every day | ORAL | 3 refills | Status: DC

## 2017-12-03 MED ORDER — MYRBETRIQ 50 MG PO TB24: ORAL_TABLET | ORAL | 3 refills | Status: DC

## 2017-12-03 MED ORDER — LISINOPRIL 40 MG PO TABS: ORAL_TABLET | ORAL | 3 refills | Status: DC

## 2017-12-03 NOTE — Progress Notes (Signed)
BP (!) 170/92   Pulse (!) 59   Temp (!) 97.2 F (36.2 C) (Oral)   Ht 5' 4"  (1.626 m)   Wt 134 lb 3.2 oz (60.9 kg)   BMI 23.04 kg/m    Subjective:    Patient ID: Darren Rose, male    DOB: 1932-01-10, 84 y.o.   MRN: [id number]  HPI: Darren Rose is a 84 y.o. male presenting on 12/03/2017 for Diabetes (6 month follow up); Hypertension; and Hyperlipidemia   HPI Prediabetes Patient comes in today for recheck of his diabetes. Patient has been currently taking no medication is been diet controlled and we are monitoring and has been doing well. Patient is currently on an ACE inhibitor/ARB. Patient has not seen an ophthalmologist this year. Patient denies any issues with their feet.   Hypertension Patient is currently on hydrochlorothiazide and lisinopril, he has been out of his hydrochlorothiazide for 3 weeks, and their blood pressure today is 170/92. Patient denies any lightheadedness or dizziness. Patient denies headaches, blurred vision, chest pains, shortness of breath, or weakness. Denies any side effects from medication and is content with current medication.   Hyperlipidemia Patient is coming in for recheck of his hyperlipidemia. The patient is currently taking atorvastatin. They deny any issues with myalgias or history of liver damage from it. They deny any focal numbness or weakness or chest pain.   Patient has BPH and saw urology and will continue with them, has been started on finasteride and is also taking Myrbetriq.  Patient says that this is been going well for him but denies any major issues with the.  Patient still has arthritis in both of his shoulders and his neck and has been using Voltaren gel which was given to him from a family member and wants a scription of his own because he says it works great for him.  Relevant past medical, surgical, family and social history reviewed and updated as indicated. Interim medical history since our last visit  reviewed. Allergies and medications reviewed and updated.  Review of Systems  Constitutional: Negative for chills and fever.  Eyes: Negative for visual disturbance.  Respiratory: Negative for shortness of breath and wheezing.   Cardiovascular: Negative for chest pain and leg swelling.  Musculoskeletal: Negative for back pain and gait problem.  Skin: Negative for rash.  Neurological: Negative for dizziness, weakness, light-headedness and headaches.  All other systems reviewed and are negative.   Per HPI unless specifically indicated above   Allergies as of 12/03/2017   No Known Allergies     Medication List        Accurate as of 12/03/17  9:08 AM. Always use your most recent med list.          alfuzosin 10 MG 24 hr tablet Commonly known as:  UROXATRAL Take 1 tablet (10 mg total) by mouth daily.   aspirin EC 81 MG tablet Take 81 mg by mouth daily.   atorvastatin 40 MG tablet Commonly known as:  LIPITOR Take 1 tablet (40 mg total) by mouth daily at 6 PM.   finasteride 5 MG tablet Commonly known as:  PROSCAR Take 1 tablet (5 mg total) by mouth daily.   hydrochlorothiazide 25 MG tablet Commonly known as:  HYDRODIURIL TOME UNA TABLETA TODOS LOS DIAS   lisinopril 40 MG tablet Commonly known as:  PRINIVIL,ZESTRIL TAKE 1 TABLET (40 MG TOTAL) BY MOUTH DAILY.   MYRBETRIQ 50 MG Tb24 tablet Generic drug:  mirabegron ER TOME UNA TABLETA TODOS  LOS D?AS          Objective:    BP (!) 170/92   Pulse (!) 59   Temp (!) 97.2 F (36.2 C) (Oral)   Ht 5' 4"  (1.626 m)   Wt 134 lb 3.2 oz (60.9 kg)   BMI 23.04 kg/m   Wt Readings from Last 3 Encounters:  12/03/17 134 lb 3.2 oz (60.9 kg)  05/31/17 133 lb (60.3 kg)  03/01/17 135 lb (61.2 kg)    Physical Exam  Constitutional: He is oriented to person, place, and time. He appears well-developed and well-nourished. No distress.  Eyes: Conjunctivae are normal. No scleral icterus.  Neck: Neck supple. No thyromegaly present.   Cardiovascular: Normal rate, regular rhythm, normal heart sounds and intact distal pulses.  No murmur heard. Pulmonary/Chest: Effort normal and breath sounds normal. No respiratory distress. He has no wheezes.  Musculoskeletal: Normal range of motion. He exhibits no edema.  Lymphadenopathy:    He has no cervical adenopathy.  Neurological: He is alert and oriented to person, place, and time. Coordination normal.  Skin: Skin is warm and dry. No rash noted. He is not diaphoretic.  Psychiatric: He has a normal mood and affect. His behavior is normal.  Nursing note and vitals reviewed.       Assessment & Plan:   Problem List Items Addressed This Visit      Cardiovascular and Mediastinum   Essential hypertension, benign   Relevant Medications   lisinopril (PRINIVIL,ZESTRIL) 40 MG tablet   atorvastatin (LIPITOR) 40 MG tablet   hydrochlorothiazide (HYDRODIURIL) 25 MG tablet   Other Relevant Orders   CMP14+EGFR     Genitourinary   BPH (benign prostatic hyperplasia)   Relevant Medications   alfuzosin (UROXATRAL) 10 MG 24 hr tablet   finasteride (PROSCAR) 5 MG tablet     Other   Hyperlipidemia with target LDL less than 100   Relevant Medications   lisinopril (PRINIVIL,ZESTRIL) 40 MG tablet   atorvastatin (LIPITOR) 40 MG tablet   hydrochlorothiazide (HYDRODIURIL) 25 MG tablet   Other Relevant Orders   Lipid panel   Prediabetes - Primary   Relevant Orders   Bayer DCA Hb A1c Waived   CBC with Differential/Platelet       Follow up plan: Return in about 3 months (around 03/05/2018), or if symptoms worsen or fail to improve, for Hypertension and prediabetes.  Counseling provided for all of the vaccine components Orders Placed This Encounter  Procedures  . Bayer DCA Hb A1c Waived  . CMP14+EGFR  . CBC with Differential/Platelet  . Lipid panel    Caryl Pina, MD [facility] 12/03/2017, 9:08 AM

## 2017-12-04 LAB — LIPID PANEL
CHOL/HDL RATIO: 2.2 ratio (ref 0.0–5.0)
CHOLESTEROL TOTAL: 108 mg/dL (ref 100–199)
HDL: 50 mg/dL (ref >39)
LDL CALC: 51 mg/dL (ref 0–99)
TRIGLYCERIDES: 37 mg/dL (ref 0–149)
VLDL CHOLESTEROL CAL: 7 mg/dL (ref 5–40)

## 2017-12-04 LAB — CBC WITH DIFFERENTIAL/PLATELET
BASOS ABS: 0.1 10*3/uL (ref 0.0–0.2)
Basos: 2 %
EOS (ABSOLUTE): 0.3 10*3/uL (ref 0.0–0.4)
EOS: 5 %
HEMATOCRIT: 37.9 % (ref 37.5–51.0)
HEMOGLOBIN: 12.9 g/dL — AB (ref 13.0–17.7)
IMMATURE GRANS (ABS): 0 10*3/uL (ref 0.0–0.1)
Immature Granulocytes: 0 %
LYMPHS ABS: 1.8 10*3/uL (ref 0.7–3.1)
LYMPHS: 32 %
MCH: 31 pg (ref 26.6–33.0)
MCHC: 34 g/dL (ref 31.5–35.7)
MCV: 91 fL (ref 79–97)
MONOCYTES: 8 %
Monocytes Absolute: 0.4 10*3/uL (ref 0.1–0.9)
Neutrophils Absolute: 3 10*3/uL (ref 1.4–7.0)
Neutrophils: 53 %
Platelets: 204 10*3/uL (ref 150–450)
RBC: 4.16 x10E6/uL (ref 4.14–5.80)
RDW: 12.3 % (ref 12.3–15.4)
WBC: 5.6 10*3/uL (ref 3.4–10.8)

## 2017-12-04 LAB — CMP14+EGFR
ALBUMIN: 4 g/dL (ref 3.5–4.7)
ALK PHOS: 78 IU/L (ref 39–117)
ALT: 18 IU/L (ref 0–44)
AST: 24 IU/L (ref 0–40)
Albumin/Globulin Ratio: 1.7 (ref 1.2–2.2)
BILIRUBIN TOTAL: 0.5 mg/dL (ref 0.0–1.2)
BUN / CREAT RATIO: 26 — AB (ref 10–24)
BUN: 19 mg/dL (ref 8–27)
CHLORIDE: 104 mmol/L (ref 96–106)
CO2: 24 mmol/L (ref 20–29)
CREATININE: 0.74 mg/dL — AB (ref 0.76–1.27)
Calcium: 9.1 mg/dL (ref 8.6–10.2)
GFR calc non Af Amer: 85 mL/min/{1.73_m2} (ref >59)
GFR, EST AFRICAN AMERICAN: 98 mL/min/{1.73_m2} (ref >59)
GLUCOSE: 87 mg/dL (ref 65–99)
Globulin, Total: 2.4 g/dL (ref 1.5–4.5)
Potassium: 4.2 mmol/L (ref 3.5–5.2)
Sodium: 143 mmol/L (ref 134–144)
TOTAL PROTEIN: 6.4 g/dL (ref 6.0–8.5)

## 2018-01-03 DIAGNOSIS — L308 Other specified dermatitis: Secondary | ICD-10-CM | POA: Diagnosis not present

## 2018-02-04 DIAGNOSIS — M19011 Primary osteoarthritis, right shoulder: Secondary | ICD-10-CM | POA: Diagnosis not present

## 2018-02-04 DIAGNOSIS — M25511 Pain in right shoulder: Secondary | ICD-10-CM | POA: Diagnosis not present

## 2018-03-04 DIAGNOSIS — M19011 Primary osteoarthritis, right shoulder: Secondary | ICD-10-CM | POA: Diagnosis not present

## 2018-03-04 DIAGNOSIS — M25511 Pain in right shoulder: Secondary | ICD-10-CM | POA: Diagnosis not present

## 2018-03-06 ENCOUNTER — Encounter: Payer: Self-pay | Admitting: Family Medicine

## 2018-03-06 ENCOUNTER — Ambulatory Visit (INDEPENDENT_AMBULATORY_CARE_PROVIDER_SITE_OTHER): Payer: Medicare Other | Admitting: Family Medicine

## 2018-03-06 VITALS — BP 145/74 | HR 48 | Temp 98.5°F | Ht 64.0 in | Wt 132.4 lb

## 2018-03-06 DIAGNOSIS — E785 Hyperlipidemia, unspecified: Secondary | ICD-10-CM

## 2018-03-06 DIAGNOSIS — I10 Essential (primary) hypertension: Secondary | ICD-10-CM

## 2018-03-06 DIAGNOSIS — N400 Enlarged prostate without lower urinary tract symptoms: Secondary | ICD-10-CM

## 2018-03-06 DIAGNOSIS — R7303 Prediabetes: Secondary | ICD-10-CM | POA: Diagnosis not present

## 2018-03-06 DIAGNOSIS — M19012 Primary osteoarthritis, left shoulder: Secondary | ICD-10-CM

## 2018-03-06 DIAGNOSIS — M19011 Primary osteoarthritis, right shoulder: Secondary | ICD-10-CM

## 2018-03-06 MED ORDER — HYDROCHLOROTHIAZIDE 25 MG PO TABS: ORAL_TABLET | ORAL | 3 refills | Status: DC

## 2018-03-06 MED ORDER — LISINOPRIL 40 MG PO TABS: ORAL_TABLET | ORAL | 3 refills | Status: DC

## 2018-03-06 MED ORDER — FINASTERIDE 5 MG PO TABS: 5.0000 mg | ORAL_TABLET | Freq: Every day | ORAL | 3 refills | Status: DC

## 2018-03-06 MED ORDER — MYRBETRIQ 50 MG PO TB24: ORAL_TABLET | ORAL | 3 refills | Status: DC

## 2018-03-06 MED ORDER — ATORVASTATIN CALCIUM 40 MG PO TABS: 40.0000 mg | ORAL_TABLET | Freq: Every day | ORAL | 3 refills | Status: DC

## 2018-03-06 MED ORDER — DOCUSATE SODIUM 100 MG PO CAPS: 100.0000 mg | ORAL_CAPSULE | Freq: Two times a day (BID) | ORAL | 5 refills | Status: DC

## 2018-03-06 NOTE — Progress Notes (Signed)
BP (!) 145/74   Pulse (!) 48   Temp 98.5 F (36.9 C) (Oral)   Ht 5\' 4"  (1.626 m)   Wt 132 lb 6.4 oz (60.1 kg)   BMI 22.73 kg/m    Subjective:    Patient ID: Darren Rose, male    DOB: Nov 25, 1931, 85 y.o.   MRN: [id number]  HPI: Darren Rose is a 85 y.o. male presenting on 03/06/2018 for Hyperlipidemia (3 month followup- Patient states he is still havibg ongoing bilateral shoulder pain.) and Hypertension   HPI Prediabetes Patient comes in today for recheck of his diabetes. Patient has been currently taking no medications for diabetes currently we are monitoring. Patient is currently on an ACE inhibitor/ARB. Patient has not seen an ophthalmologist this year. Patient denies any issues with their feet.   Hypertension Patient is currently on lisinopril-hydrochlorothiazide, and their blood pressure today is 145/74. Patient denies any lightheadedness or dizziness. Patient denies headaches, blurred vision, chest pains, shortness of breath, or weakness. Denies any side effects from medication and is content with current medication.   Hyperlipidemia Patient is coming in for recheck of his hyperlipidemia. The patient is currently taking Lipitor. They deny any issues with myalgias or history of liver damage from it. They deny any focal numbness or weakness or chest pain.   Shoulder pain and arthritis bilaterally Patient comes in complaining of shoulder pain and arthritis bilaterally.  He says is still bothering him just not improving much.  He has had some injections but does not want to do anymore injections because he did not feel like the last ones helped much.  He has seen an orthopedic out of our network but would like to see one in our network here in our office if possible.  Relevant past medical, surgical, family and social history reviewed and updated as indicated. Interim medical history since our last visit reviewed. Allergies and medications reviewed and updated.  Review of  Systems  Constitutional: Negative for chills and fever.  Eyes: Negative for visual disturbance.  Respiratory: Negative for shortness of breath and wheezing.   Cardiovascular: Negative for chest pain and leg swelling.  Musculoskeletal: Positive for arthralgias. Negative for back pain, gait problem, joint swelling, myalgias and neck stiffness.  Skin: Negative for rash.  All other systems reviewed and are negative.   Per HPI unless specifically indicated above   Allergies as of 03/06/2018   No Known Allergies     Medication List       Accurate as of March 06, 2018 11:14 AM. Always use your most recent med list.        alfuzosin 10 MG 24 hr tablet Commonly known as:  UROXATRAL Take 1 tablet (10 mg total) by mouth daily.   aspirin EC 81 MG tablet Take 81 mg by mouth daily.   atorvastatin 40 MG tablet Commonly known as:  LIPITOR Take 1 tablet (40 mg total) by mouth daily at 6 PM.   docusate sodium 100 MG capsule Commonly known as:  COLACE Take 1 capsule (100 mg total) by mouth 2 (two) times daily.   finasteride 5 MG tablet Commonly known as:  PROSCAR Take 1 tablet (5 mg total) by mouth daily.   hydrochlorothiazide 25 MG tablet Commonly known as:  HYDRODIURIL TOME UNA TABLETA TODOS LOS DIAS   lisinopril 40 MG tablet Commonly known as:  PRINIVIL,ZESTRIL TAKE 1 TABLET (40 MG TOTAL) BY MOUTH DAILY.   MYRBETRIQ 50 MG Tb24 tablet Generic drug:  mirabegron ER TOME  UNA TABLETA TODOS LOS D?AS   predniSONE 5 MG (48) Tbpk tablet Commonly known as:  STERAPRED UNI-PAK 48 TAB          Objective:    BP (!) 145/74   Pulse (!) 48   Temp 98.5 F (36.9 C) (Oral)   Ht 5\' 4"  (1.626 m)   Wt 132 lb 6.4 oz (60.1 kg)   BMI 22.73 kg/m   Wt Readings from Last 3 Encounters:  03/06/18 132 lb 6.4 oz (60.1 kg)  12/03/17 134 lb 3.2 oz (60.9 kg)  05/31/17 133 lb (60.3 kg)    Physical Exam Vitals signs and nursing note reviewed.  Constitutional:      General: He is not in  acute distress.    Appearance: He is well-developed. He is not diaphoretic.  Eyes:     General: No scleral icterus.    Conjunctiva/sclera: Conjunctivae normal.  Neck:     Musculoskeletal: Neck supple.     Thyroid: No thyromegaly.  Cardiovascular:     Rate and Rhythm: Normal rate and regular rhythm.     Heart sounds: Normal heart sounds. No murmur.  Pulmonary:     Effort: Pulmonary effort is normal. No respiratory distress.     Breath sounds: Normal breath sounds. No wheezing.  Musculoskeletal: Normal range of motion.  Lymphadenopathy:     Cervical: No cervical adenopathy.  Skin:    General: Skin is warm and dry.     Findings: No rash.  Neurological:     Mental Status: He is alert and oriented to person, place, and time.     Coordination: Coordination normal.  Psychiatric:        Behavior: Behavior normal.       Assessment & Plan:   Problem List Items Addressed This Visit      Cardiovascular and Mediastinum   Essential hypertension, benign   Relevant Medications   hydrochlorothiazide (HYDRODIURIL) 25 MG tablet   lisinopril (PRINIVIL,ZESTRIL) 40 MG tablet   atorvastatin (LIPITOR) 40 MG tablet     Genitourinary   BPH (benign prostatic hyperplasia)   Relevant Medications   finasteride (PROSCAR) 5 MG tablet     Other   Hyperlipidemia with target LDL less than 100   Relevant Medications   hydrochlorothiazide (HYDRODIURIL) 25 MG tablet   lisinopril (PRINIVIL,ZESTRIL) 40 MG tablet   atorvastatin (LIPITOR) 40 MG tablet   Prediabetes - Primary   Relevant Orders   Bayer DCA Hb A1c Waived    Other Visit Diagnoses    Bilateral shoulder region arthritis       Relevant Medications   predniSONE (STERAPRED UNI-PAK 48 TAB) 5 MG (48) TBPK tablet   Other Relevant Orders   Ambulatory referral to Orthopedic Surgery       Follow up plan: Return in about 6 months (around 09/04/2018), or if symptoms worsen or fail to improve, for Recheck for hypertension cholesterol and  prediabetes.  Counseling provided for all of the vaccine components Orders Placed This Encounter  Procedures  . Bayer DCA Hb A1c Waived  . Ambulatory referral to [place] Dettinger, MD [place] 03/06/2018, 11:14 AM

## 2018-05-28 ENCOUNTER — Encounter: Payer: Self-pay | Admitting: *Deleted

## 2018-05-28 ENCOUNTER — Telehealth: Payer: Self-pay | Admitting: Family Medicine

## 2018-05-28 NOTE — Telephone Encounter (Signed)
That is fine, go ahead and do the note

## 2018-05-28 NOTE — Telephone Encounter (Signed)
Letter written and pt aware to pick up on WED 4/29 after Dettinger signs it.

## 2018-09-11 ENCOUNTER — Ambulatory Visit: Payer: Medicare Other | Admitting: Family Medicine

## 2018-11-20 ENCOUNTER — Ambulatory Visit (INDEPENDENT_AMBULATORY_CARE_PROVIDER_SITE_OTHER): Payer: Medicare Other | Admitting: Urology

## 2018-11-20 DIAGNOSIS — R3915 Urgency of urination: Secondary | ICD-10-CM

## 2018-11-20 DIAGNOSIS — R351 Nocturia: Secondary | ICD-10-CM | POA: Diagnosis not present

## 2018-11-20 DIAGNOSIS — N401 Enlarged prostate with lower urinary tract symptoms: Secondary | ICD-10-CM | POA: Diagnosis not present

## 2019-02-14 ENCOUNTER — Telehealth: Payer: Self-pay | Admitting: Family Medicine

## 2019-02-27 ENCOUNTER — Encounter: Payer: Self-pay | Admitting: Family Medicine

## 2019-02-27 ENCOUNTER — Other Ambulatory Visit: Payer: Self-pay

## 2019-02-27 ENCOUNTER — Ambulatory Visit (INDEPENDENT_AMBULATORY_CARE_PROVIDER_SITE_OTHER): Payer: Medicare Other | Admitting: Family Medicine

## 2019-02-27 VITALS — BP 136/71 | HR 56 | Temp 95.3°F | Ht 64.0 in | Wt 125.6 lb

## 2019-02-27 DIAGNOSIS — M542 Cervicalgia: Secondary | ICD-10-CM

## 2019-02-27 MED ORDER — METHOCARBAMOL 500 MG PO TABS: 500.0000 mg | ORAL_TABLET | Freq: Three times a day (TID) | ORAL | 0 refills | Status: DC | PRN

## 2019-02-27 NOTE — Patient Instructions (Signed)
Heating pad Muscle rub - Biofreeze, Horse Liniment Exercises  Dolor musculoesquelticoCuando el dolor es intenso Musculoskeletal Pain "Dolor musculoesqueltico" hace referencia a los dolores y las [employer], las articulaciones, los msculos y los tejidos que los rodean. Este dolor puede ocurrir en cualquier parte del cuerpo. Puede durar un breve perodo (agudo) o prolongarse mucho tiempo (crnico). Es posible que se realicen un examen fsico, anlisis de laboratorio y estudios de diagnstico por imgenes para Animator causa del dolor musculoesqueltico. Siga estas indicaciones en su casa:  Estilo de vida  Trate de controlar o reducir los niveles de estrs. El estrs aumenta la tensin muscular y Biochemist, clinical musculoesqueltico. Es importante reconocer cuando est ansioso o estresado y aprender distintas formas de Market researcher. Estas pueden incluir, entre [place], las siguientes: ? Yoga o meditacin. ? Terapia cognitiva o conductual. ? Acupuntura o terapia de masajes.  Podr seguir con todas las actividades, a menos que Magazine features editor generen ms [employer]. Cuando el dolor [place], retome las actividades habituales de a poco. Aumente gradualmente la intensidad y la duracin de las actividades o del ejercicio que realice. Control del dolor, la rigidez y Engineer, structural los medicamentos de venta libre y los recetados solamente como se lo haya indicado el mdico.  Si el dolor es intenso, el reposo en cama puede ser beneficioso. Acustese o sintese en cualquier posicin que sea cmoda, pero salga de la cama y camine al [employer].  Si se lo indican, aplique calor en la zona afectada con la frecuencia que le haya indicado el mdico. Use la fuente de calor que el mdico le recomiende, como una compresa de calor hmedo o una almohadilla trmica. ? Coloque una [employer] piel y la fuente de Freight forwarder. ? Aplique el calor durante un perodo de  20a87minutos. ? Retire la fuente de calor si la piel se pone de color rojo brillante. Esto es especialmente importante si no puede sentir dolor, calor o fro. Puede correr un riesgo mayor de sufrir quemaduras.  Si se lo indican, aplique hielo sobre la zona dolorida. ? Ponga el hielo en una bolsa plstica. ? Coloque una [employer] piel y la bolsa de hielo. ? Coloque el hielo durante 3minutos, de 2a3veces por da. Instrucciones generales  El mdico puede recomendarle que consulte a un fisioterapeuta. Esta persona puede ayudarlo a elaborar un programa de ejercicios seguro. Haga ejercicios como se lo haya indicado el fisioterapeuta.  Concurra a todas las visitas de control, incluidas las sesiones de fisioterapia, como se lo hayan indicado los mdicos. Esto es importante. Comunquese con un mdico si:  El Holiday representative.  Los medicamentos no Financial trader.  No puede usar la parte del cuerpo que le duele, como un brazo, una pierna o el cuello.  Tiene dificultad para dormir.  Tiene dificultad para [employer] Corporation cotidianas. Solicite ayuda de inmediato si:  Tiene una nueva lesin o el dolor empeora o es diferente.  Tiene adormecimiento u hormigueo en la zona dolorida. Resumen  "Dolor musculoesqueltico" hace referencia a los dolores y las [employer] Standard Companies, las articulaciones, los msculos y los tejidos [employer] rodean.  Este dolor puede ocurrir en cualquier parte del cuerpo.  El mdico puede recomendarle que consulte a un fisioterapeuta. Esta persona puede ayudarlo a elaborar un programa de ejercicios seguro. Haga ejercicios como se lo haya indicado el fisioterapeuta.  Disminuya su nivel de estrs. El estrs puede Occupational hygienist  musculoesqueltico. [employer] mtodos para disminuir el estrs se pueden mencionar la meditacin, el yoga, la terapia cognitiva o conductual, la acupuntura y la terapia de [place]. Esta informacin no tiene Marine scientist el  consejo del mdico. Asegrese de hacerle al mdico cualquier pregunta que tenga. Document Revised: 10/16/2016 Document Reviewed: 10/16/2016 Elsevier Patient Education  2020 [place] distensin y esguince cervical Cervical Strain and Sprain Rehab Pregunte al mdico qu ejercicios son seguros para usted. Haga los ejercicios exactamente como se lo haya indicado el mdico y gradelos como se lo hayan indicado. Es normal sentir un estiramiento leve, tironeo, opresin o Tree surgeon al [employer] [place]. Detngase de inmediato si siente un dolor repentino o Printmaker. No comience a hacer estos ejercicios hasta que se lo indique el mdico. Ejercicios de elongacin y amplitud de movimiento Inclinacin cervical hacia un lado  1. Sintese en una silla estable o pngase de pie y [place]. 2. Sin mover los hombros, incline lentamente el lado izquierdo/derecho de la cabeza y lleve la oreja hacia el hombro hasta sentir un estiramiento en los msculos del cuello en el lado opuesto. Mantenga la mirada al frente. 3. Mantenga esta posicin durante __________ segundos. 4. Repita el ejercicio con el otro lado del cuello. Repita __________ veces. Realice este ejercicio __________ veces al da. Rotacin cervical  1. Sintese en una silla estable o pngase de pie y [place]. 2. Lentamente, gire la cabeza hacia un lado, como si quisiera mirar por encima del hombro izquierdo/derecho. ? Mantenga la mirada paralela al suelo. ? Detngase cuando sienta un estiramiento en el costado y en la parte de atrs del cuello. 3. Mantenga esta posicin durante __________ segundos. 4. Para repetir el ejercicio, gire hacia el otro lado. Repita __________ veces. Realice este ejercicio __________ veces al da. Extensin torcica y estiramiento pectoral 1. Use una toalla o una manta pequea para armar un rollo de 4pulgadas (10cm) de dimetro. 2. Acustese boca  Erma Pinto sobre una superficie firme. 3. Coloque la toalla a lo largo, debajo de la columna en la parte media de la espalda. No debe estar debajo de los omplatos. La toalla debe estar alineada con la columna, desde la parte media hasta la parte baja de la espalda. 4. Coloque las manos detrs de la cabeza y deje caer los codos [place] lados. 5. Mantenga esta posicin durante __________ segundos. Repita __________ veces. Realice este ejercicio __________ veces al da. Ejercicios de fortalecimiento Flexin cervical superior, isomtrica 1. Acustese boca arriba con una almohada fina detrs de la cabeza y una toalla pequea enrollada debajo del cuello. 2. Suavemente, hunda el mentn en el pecho y lleve la cabeza hacia abajo para mirar en direccin a los pies. No levante la cabeza de la almohada. 3. Mantenga esta posicin durante __________ segundos. 4. Afloje lentamente la tensin. Relaje los msculos por completo antes de repetir el ejercicio. Repita __________ veces. Realice este ejercicio __________ veces al da. Extensin cervical, isomtrica  1. Prese frente a una pared, a una distancia aproximada de 6pulgadas (15cm), con la espalda contra la pared. 2. Coloque un objeto blando, de unas 6 a 8pulgadas (15 a 20cm) de dimetro, entre la nuca y la pared. Puede ser Fermin Schwab pequea, una pelota o una toalla doblada. 3. Incline suavemente la cabeza hacia atrs y [place] presin sobre el objeto blando. Mantenga relajadas la mandbula y la frente. 4. Mantenga esta posicin durante __________ segundos. 5. Afloje lentamente  la tensin. Relaje los msculos por completo antes de repetir el ejercicio. Repita __________ veces. Realice este ejercicio __________ veces al da. Postura y Mudlogger se refiere a los movimientos y a las posiciones del cuerpo mientras realiza las actividades diarias. La postura es una parte de la Therapist, nutritional. La buena postura y la Engineer, agricultural  corporal saludable pueden ayudar a Theatre stage manager estrs en las articulaciones y los tejidos del cuerpo. La buena postura significa que la columna mantiene su posicin natural de curvatura en forma de S (la columna est en una posicin neutral), los hombros Lucianne Lei un poco hacia atrs y la cabeza no se inclina hacia adelante. A continuacin, se incluyen pautas generales para mejorar la postura y Quarry manager en las actividades diarias. Sentado  1. Cuando est sentado, mantenga la columna en posicin neutral y deje los pies apoyados en el suelo. Use un apoyapis, si es necesario, y [employer] muslos paralelos al suelo. Evite redondear los hombros e inclinar la cabeza hacia adelante. 2. Cuando trabaje en un escritorio o con una computadora, el escritorio debe estar a una altura en la que las manos estn un poco ms abajo que los codos. Deslice la silla debajo del escritorio, de modo de estar lo suficientemente cerca como para mantener una buena [place]. 3. Cuando trabaje con una computadora, coloque el monitor a una altura que le permita mirar derecho hacia adelante, sin tener que inclinar la cabeza hacia adelante o [place] atrs. De pie   Cuando est de pie, mantenga la columna en posicin neutral y los pies separados aproximadamente al ancho de caderas. Mantenga las rodillas ligeramente flexionadas. [place], los hombros y las caderas deben estar alineados.  Cuando realice una tarea en la que deba estar de pie en el mismo sitio durante mucho tiempo, coloque un pie en un objeto estable de 2 a 4pulgadas (5a 10cm) de alto, como un taburete. Esto ayuda a que la columna [employer]Ex posicin neutral. Reposo Al descansar o estar acostado, evite las posiciones que le causen ms dolor. Intente sostener el cuello en una posicin neutral. Puede usar una almohada anatmica o una toalla pequea enrollada. La almohada debe sostenerle el cuello, pero no empujarlo. Esta informacin no tiene Marine scientist  el consejo del mdico. Asegrese de hacerle al mdico cualquier pregunta que tenga. Document Revised: 12/03/2017 Document Reviewed: 12/03/2017 Elsevier Patient Education  [place].

## 2019-02-27 NOTE — Progress Notes (Signed)
Spanish interpreter used for the duration of this visit - Francisca  #[id number] Assessment & Plan:  1. Neck pain, musculoskeletal - Encouraged use of muscle rub, heating pad, and exercises which were provided in Spanish.  - methocarbamol (ROBAXIN) 500 MG tablet; Take 1 tablet (500 mg total) by mouth every 8 (eight) hours as needed for muscle spasms.  Dispense: 30 tablet; Refill: 0   Follow up plan: Return as scheduled with PCP next month.  Hendricks Limes, MSN, APRN, FNP-C [facility]  Subjective:   Patient ID: Darren Rose, male    DOB: 08-14-1931, 86 y.o.   MRN: [id number]  HPI: Reeve Ewell is a 86 y.o. male presenting on 02/27/2019 for Shoulder Pain (started last week. ) and Neck Pain  Patient reports right sided neck pain that started five days ago. No known injury. He has tried taking Tylenol for pain.    ROS: Negative unless specifically indicated above in HPI.   Relevant past medical history reviewed and updated as indicated.   Allergies and medications reviewed and updated.   Current Outpatient Medications:  .  alfuzosin (UROXATRAL) 10 MG 24 hr tablet, Take 1 tablet (10 mg total) by mouth daily., Disp: 90 tablet, Rfl: 3 .  aspirin EC 81 MG tablet, Take 81 mg by mouth daily., Disp: , Rfl:  .  atorvastatin (LIPITOR) 40 MG tablet, Take 1 tablet (40 mg total) by mouth daily at 6 PM., Disp: 90 tablet, Rfl: 3 .  finasteride (PROSCAR) 5 MG tablet, Take 1 tablet (5 mg total) by mouth daily., Disp: 90 tablet, Rfl: 3 .  fluticasone (CUTIVATE) 0.05 % cream, APPLY TO AFFECTED AREA OFPGROIN UP TO 2 TIMES A DAYEU, Disp: , Rfl:  .  hydrochlorothiazide (HYDRODIURIL) 25 MG tablet, TOME UNA TABLETA TODOS LOS DIAS, Disp: 90 tablet, Rfl: 3 .  lisinopril (PRINIVIL,ZESTRIL) 40 MG tablet, TAKE 1 TABLET (40 MG TOTAL) BY MOUTH DAILY., Disp: 90 tablet, Rfl: 3 .  MYRBETRIQ 50 MG TB24 tablet, TOME UNA TABLETA TODOS LOS D?AS, Disp: 90 tablet, Rfl: 3 .  augmented  betamethasone dipropionate (DIPROLENE-AF) 0.05 % cream, APPLY TO AFFECTED AREASFUP TO 2 TIMES A DAY ASYNEEDED (NOT TO FACE, GROIN OR UNDERARMS), Disp: , Rfl:  .  diclofenac sodium (VOLTAREN) 1 % GEL, APPLY 1 SMALL AMOUNT TO SKIN EVERY TWELVE HOURS, Disp: , Rfl:  .  methocarbamol (ROBAXIN) 500 MG tablet, Take 1 tablet (500 mg total) by mouth every 8 (eight) hours as needed for muscle spasms., Disp: 30 tablet, Rfl: 0  No Known Allergies  Objective:   BP 136/71   Pulse (!) 56   Temp (!) 95.3 F (35.2 C) (Temporal)   Ht 5\' 4"  (1.626 m)   Wt 125 lb 9.6 oz (57 kg)   SpO2 98%   BMI 21.56 kg/m    Physical Exam Vitals reviewed.  Constitutional:      General: He is not in acute distress.    Appearance: Normal appearance. He is not ill-appearing, toxic-appearing or diaphoretic.  HENT:     Head: Normocephalic and atraumatic.  Eyes:     General: No scleral icterus.       Right eye: No discharge.        Left eye: No discharge.     Conjunctiva/sclera: Conjunctivae normal.  Cardiovascular:     Rate and Rhythm: Normal rate.  Pulmonary:     Effort: Pulmonary effort is normal. No respiratory distress.  Musculoskeletal:        General: Normal  range of motion.     Cervical back: Normal range of motion. Muscular tenderness (right side trapezius muscle) present.  Skin:    General: Skin is warm and dry.  Neurological:     Mental Status: He is alert and oriented to person, place, and time. Mental status is at baseline.  Psychiatric:        Mood and Affect: Mood normal.        Behavior: Behavior normal.        Thought Content: Thought content normal.        Judgment: Judgment normal.

## 2019-03-10 ENCOUNTER — Other Ambulatory Visit: Payer: Self-pay

## 2019-03-10 ENCOUNTER — Ambulatory Visit (INDEPENDENT_AMBULATORY_CARE_PROVIDER_SITE_OTHER): Payer: Medicare Other | Admitting: Family Medicine

## 2019-03-10 ENCOUNTER — Encounter: Payer: Self-pay | Admitting: Family Medicine

## 2019-03-10 VITALS — BP 149/68 | HR 58 | Temp 97.5°F | Ht 64.0 in | Wt 128.4 lb

## 2019-03-10 DIAGNOSIS — E785 Hyperlipidemia, unspecified: Secondary | ICD-10-CM

## 2019-03-10 DIAGNOSIS — R7303 Prediabetes: Secondary | ICD-10-CM | POA: Diagnosis not present

## 2019-03-10 DIAGNOSIS — N3281 Overactive bladder: Secondary | ICD-10-CM

## 2019-03-10 DIAGNOSIS — N400 Enlarged prostate without lower urinary tract symptoms: Secondary | ICD-10-CM | POA: Diagnosis not present

## 2019-03-10 DIAGNOSIS — M542 Cervicalgia: Secondary | ICD-10-CM

## 2019-03-10 DIAGNOSIS — I10 Essential (primary) hypertension: Secondary | ICD-10-CM

## 2019-03-10 DIAGNOSIS — H9193 Unspecified hearing loss, bilateral: Secondary | ICD-10-CM

## 2019-03-10 LAB — BAYER DCA HB A1C WAIVED: HB A1C (BAYER DCA - WAIVED): 5.7 % (ref <7.0)

## 2019-03-10 MED ORDER — METHOCARBAMOL 500 MG PO TABS: 500.0000 mg | ORAL_TABLET | Freq: Three times a day (TID) | ORAL | 2 refills | Status: DC | PRN

## 2019-03-10 MED ORDER — ALFUZOSIN HCL ER 10 MG PO TB24: 10.0000 mg | ORAL_TABLET | Freq: Every day | ORAL | 3 refills | Status: DC

## 2019-03-10 NOTE — Progress Notes (Signed)
BP (!) 149/68   Pulse (!) 58   Temp (!) 97.5 F (36.4 C) (Temporal)   Ht _0  (1.626 m)   Wt 128 lb 6.4 oz (58.2 kg)   SpO2 100%   BMI 22.04 kg/m    Subjective:   Patient ID: Darren Rose, male    DOB: Sep 20, 1931, 86 y.o.   MRN: [id number]  HPI: Darren Rose is a 86 y.o. male presenting on 03/10/2019 for Hypertension and prediabetes   HPI Prediabetes Patient comes in today for recheck of his diabetes. Patient has been currently taking no medication and A1c is 5.7 and under control.. Patient is currently on an ACE inhibitor/ARB. Patient has not seen an ophthalmologist this year. Patient denies any issues with their feet.   Hyperlipidemia Patient is coming in for recheck of his hyperlipidemia. The patient is currently taking Lipitor. They deny any issues with myalgias or history of liver damage from it. They deny any focal numbness or weakness or chest pain.   Hypertension Patient is currently on hydrochlorothiazide and lisinopril, and their blood pressure today is 149/68. Patient denies any lightheadedness or dizziness. Patient denies headaches, blurred vision, chest pains, shortness of breath, or weakness. Denies any side effects from medication and is content with current medication.   BPH and OAB and history of gout Patient is coming in for recheck on BPH Symptoms: None currently Medication: Uroxatrol and finasteride and Myrbetriq Last PSA: Unknown  Patient has been having progressive hearing loss has been bilateral in the past as well.  Patient has cervicalgia and currently uses Voltaren gel and methocarbamol but is only been using methocarbamol once a day and he will increase it to twice a day and see if that helps.  Relevant past medical, surgical, family and social history reviewed and updated as indicated. Interim medical history since our last visit reviewed. Allergies and medications reviewed and updated.  Review of Systems  Constitutional: Negative for  chills and fever.  Eyes: Negative for visual disturbance.  Respiratory: Negative for shortness of breath and wheezing.   Cardiovascular: Negative for chest pain and leg swelling.  Musculoskeletal: Positive for arthralgias, myalgias and neck pain. Negative for back pain and gait problem.  Skin: Negative for rash.  Neurological: Negative for dizziness, weakness and light-headedness.  All other systems reviewed and are negative.   Per HPI unless specifically indicated above   Allergies as of 03/10/2019   No Known Allergies     Medication List       Accurate as of March 10, 2019 12:04 PM. If you have any questions, ask your nurse or doctor.        STOP taking these medications   augmented betamethasone dipropionate 0.05 % cream Commonly known as: DIPROLENE-AF Stopped by: Fransisca Kaufmann Kahealani Yankovich, MD   fluticasone 0.05 % cream Commonly known as: CUTIVATE Stopped by: Worthy Rancher, MD     TAKE these medications   alfuzosin 10 MG 24 hr tablet Commonly known as: UROXATRAL Take 1 tablet (10 mg total) by mouth daily.   aspirin EC 81 MG tablet Take 81 mg by mouth daily.   atorvastatin 40 MG tablet Commonly known as: LIPITOR Take 1 tablet (40 mg total) by mouth daily at 6 PM.   diclofenac sodium 1 % Gel Commonly known as: VOLTAREN APPLY 1 SMALL AMOUNT TO SKIN EVERY TWELVE HOURS   finasteride 5 MG tablet Commonly known as: PROSCAR Take 1 tablet (5 mg total) by mouth daily.   hydrochlorothiazide 25 MG  tablet Commonly known as: HYDRODIURIL TOME UNA TABLETA TODOS LOS DIAS   lisinopril 40 MG tablet Commonly known as: ZESTRIL TAKE 1 TABLET (40 MG TOTAL) BY MOUTH DAILY.   methocarbamol 500 MG tablet Commonly known as: Robaxin Take 1 tablet (500 mg total) by mouth every 8 (eight) hours as needed for muscle spasms.   Myrbetriq 50 MG Tb24 tablet Generic drug: mirabegron ER TOME UNA TABLETA TODOS LOS D?AS        Objective:   BP (!) 149/68   Pulse (!) 58   Temp  (!) 97.5 F (36.4 C) (Temporal)   Ht _0  (1.626 m)   Wt 128 lb 6.4 oz (58.2 kg)   SpO2 100%   BMI 22.04 kg/m   Wt Readings from Last 3 Encounters:  03/10/19 128 lb 6.4 oz (58.2 kg)  02/27/19 125 lb 9.6 oz (57 kg)  03/06/18 132 lb 6.4 oz (60.1 kg)    Physical Exam Vitals and nursing note reviewed.  Constitutional:      General: He is not in acute distress.    Appearance: He is well-developed. He is not diaphoretic.  HENT:     Right Ear: Tympanic membrane and ear canal normal.     Left Ear: Tympanic membrane and ear canal normal.  Eyes:     General: No scleral icterus.    Conjunctiva/sclera: Conjunctivae normal.  Neck:     Thyroid: No thyromegaly.  Cardiovascular:     Rate and Rhythm: Normal rate and regular rhythm.     Heart sounds: Normal heart sounds. No murmur.  Pulmonary:     Effort: Pulmonary effort is normal. No respiratory distress.     Breath sounds: Normal breath sounds. No wheezing.  Musculoskeletal:     Cervical back: Neck supple.  Lymphadenopathy:     Cervical: No cervical adenopathy.  Skin:    General: Skin is warm and dry.     Findings: No rash.  Neurological:     Mental Status: He is alert and oriented to person, place, and time.     Coordination: Coordination normal.  Psychiatric:        Behavior: Behavior normal.       Assessment & Plan:   Problem List Items Addressed This Visit      Cardiovascular and Mediastinum   Essential hypertension, benign   Relevant Orders   CBC with Differential/Platelet   CMP14+EGFR     Genitourinary   BPH (benign prostatic hyperplasia)   Relevant Medications   alfuzosin (UROXATRAL) 10 MG 24 hr tablet   Other Relevant Orders   CBC with Differential/Platelet   PSA, total and free   OAB (overactive bladder)   Relevant Orders   CBC with Differential/Platelet     Other   Hyperlipidemia with target LDL less than 100   Relevant Orders   Lipid panel   Prediabetes - Primary   Relevant Orders   Microalbumin  / creatinine urine ratio   Bayer DCA Hb A1c Waived (Completed)   CBC with Differential/Platelet    Other Visit Diagnoses    Progressive hearing loss of both ears       Relevant Orders   Ambulatory referral to Audiology   Neck pain, musculoskeletal       Relevant Medications   methocarbamol (ROBAXIN) 500 MG tablet      A1c looks great, will do blood work and urine  Instructed that he could double up his Robaxin or take 2/day, is only currently taking 1/day and it  helps some.  No change in medication currently Follow up plan: Return in about 6 months (around 09/07/2019), or if symptoms worsen or fail to improve, for Diabetes and hypertension and cholesterol recheck.  Counseling provided for all of the vaccine components Orders Placed This Encounter  Procedures  . Microalbumin / creatinine urine ratio  . [facility] Hb A1c [place], MD [facility] 03/10/2019, 12:04 PM

## 2019-03-11 ENCOUNTER — Telehealth: Payer: Self-pay | Admitting: Family Medicine

## 2019-03-11 LAB — CBC WITH DIFFERENTIAL/PLATELET
Basophils Absolute: 0.1 10*3/uL (ref 0.0–0.2)
Basos: 1 %
EOS (ABSOLUTE): 0.2 10*3/uL (ref 0.0–0.4)
Eos: 3 %
Hematocrit: 35.8 % — ABNORMAL LOW (ref 37.5–51.0)
Hemoglobin: 12 g/dL — ABNORMAL LOW (ref 13.0–17.7)
Immature Grans (Abs): 0 10*3/uL (ref 0.0–0.1)
Immature Granulocytes: 0 %
Lymphocytes Absolute: 1.7 10*3/uL (ref 0.7–3.1)
Lymphs: 19 %
MCH: 30.2 pg (ref 26.6–33.0)
MCHC: 33.5 g/dL (ref 31.5–35.7)
MCV: 90 fL (ref 79–97)
Monocytes Absolute: 0.6 10*3/uL (ref 0.1–0.9)
Monocytes: 7 %
Neutrophils Absolute: 6.3 10*3/uL (ref 1.4–7.0)
Neutrophils: 70 %
Platelets: 210 10*3/uL (ref 150–450)
RBC: 3.97 x10E6/uL — ABNORMAL LOW (ref 4.14–5.80)
RDW: 12.3 % (ref 11.6–15.4)
WBC: 8.9 10*3/uL (ref 3.4–10.8)

## 2019-03-11 LAB — LIPID PANEL
Chol/HDL Ratio: 2.1 ratio (ref 0.0–5.0)
Cholesterol, Total: 93 mg/dL — ABNORMAL LOW (ref 100–199)
HDL: 44 mg/dL (ref >39)
LDL Chol Calc (NIH): 37 mg/dL (ref 0–99)
Triglycerides: 44 mg/dL (ref 0–149)
VLDL Cholesterol Cal: 12 mg/dL (ref 5–40)

## 2019-03-11 LAB — CMP14+EGFR
ALT: 19 IU/L (ref 0–44)
AST: 23 IU/L (ref 0–40)
Albumin/Globulin Ratio: 1.4 (ref 1.2–2.2)
Albumin: 3.7 g/dL (ref 3.6–4.6)
Alkaline Phosphatase: 79 IU/L (ref 39–117)
BUN/Creatinine Ratio: 22 (ref 10–24)
BUN: 17 mg/dL (ref 8–27)
Bilirubin Total: 0.6 mg/dL (ref 0.0–1.2)
CO2: 29 mmol/L (ref 20–29)
Calcium: 9.2 mg/dL (ref 8.6–10.2)
Chloride: 103 mmol/L (ref 96–106)
Creatinine, Ser: 0.79 mg/dL (ref 0.76–1.27)
GFR calc Af Amer: 94 mL/min/{1.73_m2} (ref >59)
GFR calc non Af Amer: 81 mL/min/{1.73_m2} (ref >59)
Globulin, Total: 2.7 g/dL (ref 1.5–4.5)
Glucose: 95 mg/dL (ref 65–99)
Potassium: 4.3 mmol/L (ref 3.5–5.2)
Sodium: 143 mmol/L (ref 134–144)
Total Protein: 6.4 g/dL (ref 6.0–8.5)

## 2019-03-11 LAB — PSA, TOTAL AND FREE
PSA, Free Pct: 20 %
PSA, Free: 0.1 ng/mL
Prostate Specific Ag, Serum: 0.5 ng/mL (ref 0.0–4.0)

## 2019-03-11 LAB — MICROALBUMIN / CREATININE URINE RATIO
Creatinine, Urine: 29.7 mg/dL
Microalb/Creat Ratio: <10 mg/g creat (ref 0–29)
Microalbumin, Urine: <3 ug/mL

## 2019-03-12 ENCOUNTER — Other Ambulatory Visit: Payer: Self-pay | Admitting: Family Medicine

## 2019-03-12 DIAGNOSIS — I10 Essential (primary) hypertension: Secondary | ICD-10-CM

## 2019-03-12 NOTE — Telephone Encounter (Signed)
We talked about his neck and the back of his neck and shoulders hurting and we talked that he was going to increase his methocarbamol to twice a day because he only been taking it once a day, I do not recall talking as much about the headaches.  Have him take ibuprofen and Tylenol this morning and he can take the muscle relaxer twice a day and then let me know if it is not improved.  That is what we talked about though is that he was going to increase his muscle relaxer to twice a day

## 2019-03-23 ENCOUNTER — Other Ambulatory Visit: Payer: Self-pay | Admitting: Family Medicine

## 2019-03-23 DIAGNOSIS — E785 Hyperlipidemia, unspecified: Secondary | ICD-10-CM

## 2019-03-24 ENCOUNTER — Other Ambulatory Visit: Payer: Self-pay | Admitting: Family Medicine

## 2019-03-24 DIAGNOSIS — I10 Essential (primary) hypertension: Secondary | ICD-10-CM

## 2019-04-10 DIAGNOSIS — L308 Other specified dermatitis: Secondary | ICD-10-CM | POA: Diagnosis not present

## 2019-05-19 ENCOUNTER — Telehealth: Payer: Self-pay | Admitting: Family Medicine

## 2019-05-19 NOTE — Chronic Care Management (AMB) (Signed)
  Chronic Care Management   Outreach Note  05/19/2019 Name: Darren Rose MRN: [id number] DOB: 1931/04/10  Darren Rose is a 86 y.o. year old male who is a primary care patient of Dettinger, Fransisca Kaufmann, MD. I reached out to Darren Rose by phone today in response to a referral sent by Darren Rose's health plan.     An unsuccessful telephone outreach was attempted today. The patient was referred to the case management team for assistance with care management and care coordination.   Follow Up Plan: The care management team will reach out to the patient again over the next 7 days. If patient returns call to provider office, please advise to call [facility] at [phone].  [place], [place] [address] Direct Dial: [phone] [email or web address]: [email or web address]

## 2019-05-21 NOTE — Chronic Care Management (AMB) (Signed)
  Chronic Care Management   Outreach Note  05/21/2019 Name: Darren Rose MRN: [id number] DOB: 1931/03/07  Darren Rose is a 86 y.o. year old male who is a primary care patient of Dettinger, Fransisca Kaufmann, MD. I reached out to [name] by phone today in response to a referral sent by [name] health plan.     A second unsuccessful telephone outreach was attempted today. The patient was referred to the case management team for assistance with care management and care coordination.   Follow Up Plan: The care management team will reach out to the patient again over the next 7 days.  If patient returns call to provider office, please advise to call [facility] at [phone].  [place],  [address] Direct Dial: [phone] [email or web address]: [email or web address]

## 2019-05-23 NOTE — Chronic Care Management (AMB) (Signed)
  Chronic Care Management   Outreach Note  05/23/2019 Name: Iver Melley MRN: [id number] DOB: 1931/05/03  Fabrizio Hooey is a 86 y.o. year old male who is a primary care patient of Dettinger, Fransisca Kaufmann, MD. I reached out to [name] by phone today in response to a referral sent by Mr. Ryoma Packett health plan.     Third unsuccessful telephone outreach was attempted today. The patient was referred to the case management team for assistance with care management and care coordination. The patient's primary care provider has been notified of our unsuccessful attempts to make or maintain contact with the patient. The care management team is pleased to engage with this patient at any time in the future should he/she be interested in assistance from the care management team.   Follow Up Plan: The care management team is available to follow up with the patient after provider conversation with the patient regarding recommendation for care management engagement and subsequent re-referral to the care management team. If patient returns call to provider office, please advise to call [facility] at [phone].  [place], [place] [address] Direct Dial: [phone] [email or web address] [email or web address]: [email or web address]

## 2019-08-19 DIAGNOSIS — M2041 Other hammer toe(s) (acquired), right foot: Secondary | ICD-10-CM | POA: Diagnosis not present

## 2019-09-14 ENCOUNTER — Other Ambulatory Visit: Payer: Self-pay | Admitting: Family Medicine

## 2019-09-14 DIAGNOSIS — E785 Hyperlipidemia, unspecified: Secondary | ICD-10-CM

## 2019-09-27 ENCOUNTER — Other Ambulatory Visit: Payer: Self-pay | Admitting: Family Medicine

## 2019-09-27 DIAGNOSIS — I10 Essential (primary) hypertension: Secondary | ICD-10-CM

## 2019-09-28 ENCOUNTER — Other Ambulatory Visit: Payer: Self-pay | Admitting: Family Medicine

## 2019-09-28 DIAGNOSIS — E785 Hyperlipidemia, unspecified: Secondary | ICD-10-CM

## 2019-09-30 DIAGNOSIS — S51011A Laceration without foreign body of right elbow, initial encounter: Secondary | ICD-10-CM | POA: Diagnosis not present

## 2019-09-30 DIAGNOSIS — W01198A Fall on same level from slipping, tripping and stumbling with subsequent striking against other object, initial encounter: Secondary | ICD-10-CM | POA: Diagnosis not present

## 2019-09-30 DIAGNOSIS — S5001XA Contusion of right elbow, initial encounter: Secondary | ICD-10-CM | POA: Diagnosis not present

## 2019-09-30 DIAGNOSIS — Z23 Encounter for immunization: Secondary | ICD-10-CM | POA: Diagnosis not present

## 2019-10-01 ENCOUNTER — Telehealth: Payer: Self-pay | Admitting: Family Medicine

## 2019-10-01 NOTE — Telephone Encounter (Signed)
appt made.  Patient aware  

## 2019-10-07 ENCOUNTER — Other Ambulatory Visit: Payer: Self-pay

## 2019-10-07 ENCOUNTER — Ambulatory Visit (INDEPENDENT_AMBULATORY_CARE_PROVIDER_SITE_OTHER): Payer: Medicare Other | Admitting: Family Medicine

## 2019-10-07 ENCOUNTER — Encounter: Payer: Self-pay | Admitting: Family Medicine

## 2019-10-07 VITALS — BP 134/71 | HR 55 | Temp 97.6°F | Ht 64.0 in | Wt 124.8 lb

## 2019-10-07 DIAGNOSIS — S51011A Laceration without foreign body of right elbow, initial encounter: Secondary | ICD-10-CM | POA: Diagnosis not present

## 2019-10-07 NOTE — Progress Notes (Signed)
Chief Complaint  Patient presents with  . ER Follow up    Elbow laceration- No stitches    HPI  Patient presents today for wound to the right elbow from a fall about a week ago.  He fell over a tree.  He went to the emergency room and was bandaged and assessed and no stitches were placed.  Today he says that it hurts a little bit every now and then but mostly he has no complaints.  He just wants to be sure it is not infected.  PMH: Smoking status noted ROS: Per HPI  Objective: BP 134/71   Pulse (!) 55   Temp 97.6 F (36.4 C) (Temporal)   Ht 5\' 4"  (1.626 m)   Wt 124 lb 12.8 oz (56.6 kg)   BMI 21.42 kg/m  Gen: NAD, alert, cooperative with exam HEENT: NCAT, EOMI, PERRL CV: RRR, good S1/S2, no murmur Resp: CTABL, no wheezes, non-labored Ext: No edema, warm.  The right elbow has an irregular laceration with good approximation and crusting.  No signs of infection including redness swelling and tenderness.  No drainage. Neuro: Alert and oriented, No gross deficits   Assessment and plan:  1. Laceration of right elbow, initial encounter     Wound care reviewed.  Signs and symptoms of infection instructed.   Follow up as needed.  Claretta Fraise, MD

## 2019-10-31 ENCOUNTER — Other Ambulatory Visit: Payer: Self-pay | Admitting: Family Medicine

## 2019-10-31 DIAGNOSIS — I10 Essential (primary) hypertension: Secondary | ICD-10-CM

## 2019-11-03 ENCOUNTER — Ambulatory Visit (INDEPENDENT_AMBULATORY_CARE_PROVIDER_SITE_OTHER): Payer: Medicare Other | Admitting: Family Medicine

## 2019-11-03 ENCOUNTER — Encounter: Payer: Self-pay | Admitting: Family Medicine

## 2019-11-03 ENCOUNTER — Other Ambulatory Visit: Payer: Self-pay

## 2019-11-03 VITALS — BP 135/66 | HR 56 | Temp 97.7°F | Ht 64.0 in | Wt 125.0 lb

## 2019-11-03 DIAGNOSIS — E785 Hyperlipidemia, unspecified: Secondary | ICD-10-CM | POA: Diagnosis not present

## 2019-11-03 DIAGNOSIS — H9193 Unspecified hearing loss, bilateral: Secondary | ICD-10-CM | POA: Diagnosis not present

## 2019-11-03 DIAGNOSIS — I10 Essential (primary) hypertension: Secondary | ICD-10-CM

## 2019-11-03 DIAGNOSIS — R7303 Prediabetes: Secondary | ICD-10-CM | POA: Diagnosis not present

## 2019-11-03 DIAGNOSIS — Z23 Encounter for immunization: Secondary | ICD-10-CM

## 2019-11-03 LAB — BAYER DCA HB A1C WAIVED: HB A1C (BAYER DCA - WAIVED): 5.7 % (ref <7.0)

## 2019-11-03 NOTE — Addendum Note (Signed)
Addended by: Alphonzo Dublin on: 11/03/2019 04:49 PM   Modules accepted: Orders

## 2019-11-03 NOTE — Progress Notes (Signed)
BP 135/66   Pulse (!) 56   Temp 97.7 F (36.5 C)   Ht 5' 4"  (1.626 m)   Wt 125 lb (56.7 kg)   SpO2 97%   BMI 21.46 kg/m    Subjective:   Patient ID: Darren Rose, male    DOB: Jan 28, 1932, 86 y.o.   MRN: [id number]  HPI: Darren Rose is a 86 y.o. male presenting on 11/03/2019 for Medical Management of Chronic Issues and Prediabetes   HPI Patient has been having progressive hearing loss over the past couple years and it is worsened to where he feels like he cannot hear anything out of 1 year and very little out of the other ear.  He would like to go see somebody get it tested.  Hypertension Patient is currently on lisinopril hydrochlorothiazide, and their blood pressure today is 135/66. Patient denies any lightheadedness or dizziness. Patient denies headaches, blurred vision, chest pains, shortness of breath, or weakness. Denies any side effects from medication and is content with current medication.   Hyperlipidemia Patient is coming in for recheck of his hyperlipidemia. The patient is currently taking atorvastatin. They deny any issues with myalgias or history of liver damage from it. They deny any focal numbness or weakness or chest pain.   Prediabetes Patient comes in today for recheck of his diabetes. Patient has been currently taking Diet controlled, A1c is 5.7.. Patient is currently on an ACE inhibitor/ARB. Patient has not seen an ophthalmologist this year. Patient denies any issues with their feet. The symptom started onset as an adult hypertension hyperlipidemia ARE RELATED TO DM   Relevant past medical, surgical, family and social history reviewed and updated as indicated. Interim medical history since our last visit reviewed. Allergies and medications reviewed and updated.  Review of Systems  Constitutional: Negative for chills and fever.  HENT: Positive for hearing loss. Negative for facial swelling.   Respiratory: Negative for shortness of breath and  wheezing.   Cardiovascular: Negative for chest pain and leg swelling.  Musculoskeletal: Negative for back pain and gait problem.  Skin: Negative for rash.  Neurological: Negative for dizziness, weakness and numbness.  All other systems reviewed and are negative.   Per HPI unless specifically indicated above   Allergies as of 11/03/2019   No Known Allergies     Medication List       Accurate as of November 03, 2019  2:15 PM. If you have any questions, ask your nurse or doctor.        alfuzosin 10 MG 24 hr tablet Commonly known as: UROXATRAL Take 1 tablet (10 mg total) by mouth daily.   aspirin EC 81 MG tablet Take 81 mg by mouth daily.   atorvastatin 40 MG tablet Commonly known as: LIPITOR Take 1 tablet (40 mg total) by mouth daily at 6 PM.   diclofenac sodium 1 % Gel Commonly known as: VOLTAREN APPLY 1 SMALL AMOUNT TO SKIN EVERY TWELVE HOURS   finasteride 5 MG tablet Commonly known as: PROSCAR Take 1 tablet (5 mg total) by mouth daily.   hydrochlorothiazide 25 MG tablet Commonly known as: HYDRODIURIL TOME UNA TABLETA TODOS LOS DIAS   lisinopril 40 MG tablet Commonly known as: ZESTRIL TAKE 1 TABLET BY MOUTH EVERY DAY   methocarbamol 500 MG tablet Commonly known as: Robaxin Take 1 tablet (500 mg total) by mouth every 8 (eight) hours as needed for muscle spasms.   Myrbetriq 50 MG Tb24 tablet Generic drug: mirabegron ER TOME UNA TABLETA TODOS  LOS D?AS        Objective:   BP 135/66   Pulse (!) 56   Temp 97.7 F (36.5 C)   Ht 5' 4"  (1.626 m)   Wt 125 lb (56.7 kg)   SpO2 97%   BMI 21.46 kg/m   Wt Readings from Last 3 Encounters:  11/03/19 125 lb (56.7 kg)  10/07/19 124 lb 12.8 oz (56.6 kg)  03/10/19 128 lb 6.4 oz (58.2 kg)    Physical Exam Vitals and nursing note reviewed.  Constitutional:      General: He is not in acute distress.    Appearance: He is well-developed. He is not diaphoretic.  HENT:     Right Ear: Tympanic membrane, ear canal and  external ear normal. There is no impacted cerumen.     Left Ear: Tympanic membrane, ear canal and external ear normal. There is no impacted cerumen.  Eyes:     General: No scleral icterus.    Conjunctiva/sclera: Conjunctivae normal.  Neck:     Thyroid: No thyromegaly.  Cardiovascular:     Rate and Rhythm: Normal rate and regular rhythm.     Heart sounds: Normal heart sounds. No murmur heard.   Pulmonary:     Effort: Pulmonary effort is normal. No respiratory distress.     Breath sounds: Normal breath sounds. No wheezing.  Musculoskeletal:        General: Normal range of motion.     Cervical back: Neck supple.  Lymphadenopathy:     Cervical: No cervical adenopathy.  Skin:    General: Skin is warm and dry.     Findings: No rash.  Neurological:     Mental Status: He is alert and oriented to person, place, and time.     Coordination: Coordination normal.  Psychiatric:        Behavior: Behavior normal.     Results for orders placed or performed in visit on 11/03/19  Bayer DCA Hb A1c Waived  Result Value Ref Range   HB A1C (BAYER DCA - WAIVED) 5.7 <7.0 %    Assessment & Plan:   Problem List Items Addressed This Visit      Cardiovascular and Mediastinum   Essential hypertension, benign   Relevant Orders   CMP14+EGFR     Other   Hyperlipidemia with target LDL less than 100   Relevant Orders   Lipid panel   Prediabetes - Primary   Relevant Orders   Bayer DCA Hb A1c Waived (Completed)   CBC with Differential/Platelet   CMP14+EGFR    Other Visit Diagnoses    Progressive hearing loss of both ears       Relevant Orders   Ambulatory referral to Audiology      Will refer to audiology, A1c looks good, can continue current medication.  No change in medication. Follow up plan: Return in about 3 months (around 02/03/2020), or if symptoms worsen or fail to improve, for Prediabetes and hypertension.  Counseling provided for all of the vaccine components Orders Placed This  Encounter  Procedures  . Bayer DCA Hb A1c Waived  . CBC with Differential/Platelet  . CMP14+EGFR  . Lipid panel  . Ambulatory referral to Audiology    Darren Pina, MD [facility] 11/03/2019, 2:15 PM

## 2019-11-04 LAB — LIPID PANEL
Chol/HDL Ratio: 2.5 ratio (ref 0.0–5.0)
Cholesterol, Total: 98 mg/dL — ABNORMAL LOW (ref 100–199)
HDL: 40 mg/dL (ref >39)
LDL Chol Calc (NIH): 44 mg/dL (ref 0–99)
Triglycerides: 60 mg/dL (ref 0–149)
VLDL Cholesterol Cal: 14 mg/dL (ref 5–40)

## 2019-11-04 LAB — CBC WITH DIFFERENTIAL/PLATELET
Basophils Absolute: 0.1 10*3/uL (ref 0.0–0.2)
Basos: 1 %
EOS (ABSOLUTE): 0.3 10*3/uL (ref 0.0–0.4)
Eos: 5 %
Hematocrit: 35 % — ABNORMAL LOW (ref 37.5–51.0)
Hemoglobin: 11.8 g/dL — ABNORMAL LOW (ref 13.0–17.7)
Immature Grans (Abs): 0 10*3/uL (ref 0.0–0.1)
Immature Granulocytes: 0 %
Lymphocytes Absolute: 1.8 10*3/uL (ref 0.7–3.1)
Lymphs: 32 %
MCH: 31.1 pg (ref 26.6–33.0)
MCHC: 33.7 g/dL (ref 31.5–35.7)
MCV: 92 fL (ref 79–97)
Monocytes Absolute: 0.4 10*3/uL (ref 0.1–0.9)
Monocytes: 8 %
Neutrophils Absolute: 3.1 10*3/uL (ref 1.4–7.0)
Neutrophils: 54 %
Platelets: 204 10*3/uL (ref 150–450)
RBC: 3.8 x10E6/uL — ABNORMAL LOW (ref 4.14–5.80)
RDW: 12.5 % (ref 11.6–15.4)
WBC: 5.7 10*3/uL (ref 3.4–10.8)

## 2019-11-04 LAB — CMP14+EGFR
ALT: 10 IU/L (ref 0–44)
AST: 16 IU/L (ref 0–40)
Albumin/Globulin Ratio: 1.6 (ref 1.2–2.2)
Albumin: 3.9 g/dL (ref 3.6–4.6)
Alkaline Phosphatase: 85 IU/L (ref 44–121)
BUN/Creatinine Ratio: 28 — ABNORMAL HIGH (ref 10–24)
BUN: 21 mg/dL (ref 8–27)
Bilirubin Total: 0.4 mg/dL (ref 0.0–1.2)
CO2: 28 mmol/L (ref 20–29)
Calcium: 9.2 mg/dL (ref 8.6–10.2)
Chloride: 101 mmol/L (ref 96–106)
Creatinine, Ser: 0.75 mg/dL — ABNORMAL LOW (ref 0.76–1.27)
GFR calc Af Amer: 96 mL/min/{1.73_m2} (ref >59)
GFR calc non Af Amer: 83 mL/min/{1.73_m2} (ref >59)
Globulin, Total: 2.5 g/dL (ref 1.5–4.5)
Glucose: 121 mg/dL — ABNORMAL HIGH (ref 65–99)
Potassium: 3.7 mmol/L (ref 3.5–5.2)
Sodium: 142 mmol/L (ref 134–144)
Total Protein: 6.4 g/dL (ref 6.0–8.5)

## 2019-11-05 ENCOUNTER — Telehealth: Payer: Self-pay | Admitting: Family Medicine

## 2019-11-07 NOTE — Telephone Encounter (Signed)
rc for [employer] [phone]

## 2019-12-03 ENCOUNTER — Other Ambulatory Visit: Payer: Self-pay | Admitting: Urology

## 2019-12-08 ENCOUNTER — Other Ambulatory Visit: Payer: Self-pay

## 2019-12-08 DIAGNOSIS — N3281 Overactive bladder: Secondary | ICD-10-CM

## 2019-12-08 DIAGNOSIS — R350 Frequency of micturition: Secondary | ICD-10-CM

## 2019-12-08 MED ORDER — MYRBETRIQ 50 MG PO TB24: ORAL_TABLET | ORAL | 0 refills | Status: DC

## 2019-12-11 DIAGNOSIS — H9312 Tinnitus, left ear: Secondary | ICD-10-CM | POA: Diagnosis not present

## 2019-12-11 DIAGNOSIS — H903 Sensorineural hearing loss, bilateral: Secondary | ICD-10-CM | POA: Diagnosis not present

## 2019-12-20 ENCOUNTER — Other Ambulatory Visit: Payer: Self-pay | Admitting: Family Medicine

## 2019-12-20 DIAGNOSIS — E785 Hyperlipidemia, unspecified: Secondary | ICD-10-CM

## 2019-12-28 ENCOUNTER — Other Ambulatory Visit: Payer: Self-pay | Admitting: Family Medicine

## 2019-12-28 DIAGNOSIS — I10 Essential (primary) hypertension: Secondary | ICD-10-CM

## 2020-01-20 ENCOUNTER — Other Ambulatory Visit: Payer: Self-pay | Admitting: Family Medicine

## 2020-01-20 DIAGNOSIS — N400 Enlarged prostate without lower urinary tract symptoms: Secondary | ICD-10-CM

## 2020-01-26 ENCOUNTER — Ambulatory Visit: Payer: Medicare Other | Admitting: Urology

## 2020-01-30 ENCOUNTER — Other Ambulatory Visit: Payer: Self-pay | Admitting: Family Medicine

## 2020-01-30 DIAGNOSIS — I10 Essential (primary) hypertension: Secondary | ICD-10-CM

## 2020-02-04 ENCOUNTER — Encounter: Payer: Self-pay | Admitting: Urology

## 2020-02-04 ENCOUNTER — Other Ambulatory Visit: Payer: Self-pay

## 2020-02-04 ENCOUNTER — Ambulatory Visit (INDEPENDENT_AMBULATORY_CARE_PROVIDER_SITE_OTHER): Payer: Medicare Other | Admitting: Urology

## 2020-02-04 VITALS — BP 159/71 | HR 65 | Temp 98.8°F | Ht 64.0 in | Wt 125.0 lb

## 2020-02-04 DIAGNOSIS — N401 Enlarged prostate with lower urinary tract symptoms: Secondary | ICD-10-CM | POA: Diagnosis not present

## 2020-02-04 DIAGNOSIS — R3915 Urgency of urination: Secondary | ICD-10-CM

## 2020-02-04 DIAGNOSIS — N400 Enlarged prostate without lower urinary tract symptoms: Secondary | ICD-10-CM

## 2020-02-04 DIAGNOSIS — R351 Nocturia: Secondary | ICD-10-CM

## 2020-02-04 DIAGNOSIS — N3281 Overactive bladder: Secondary | ICD-10-CM

## 2020-02-04 LAB — POCT URINALYSIS DIPSTICK
Bilirubin, UA: NEGATIVE
Glucose, UA: NEGATIVE
Ketones, UA: NEGATIVE
Leukocytes, UA: NEGATIVE
Nitrite, UA: NEGATIVE
Protein, UA: NEGATIVE
Spec Grav, UA: 1.02 (ref 1.010–1.025)
Urobilinogen, UA: 0.2 E.U./dL
pH, UA: 7 (ref 5.0–8.0)

## 2020-02-04 MED ORDER — FINASTERIDE 5 MG PO TABS: 5.0000 mg | ORAL_TABLET | Freq: Every day | ORAL | 3 refills | Status: DC

## 2020-02-04 MED ORDER — MIRABEGRON ER 50 MG PO TB24: ORAL_TABLET | ORAL | 11 refills | Status: DC

## 2020-02-04 MED ORDER — ALFUZOSIN HCL ER 10 MG PO TB24
10.0000 mg | ORAL_TABLET | Freq: Every day | ORAL | 3 refills | Status: DC
Start: 2020-02-04 — End: 2020-12-06

## 2020-02-04 NOTE — Patient Instructions (Signed)

## 2020-02-04 NOTE — Progress Notes (Signed)
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
## 2020-03-01 ENCOUNTER — Other Ambulatory Visit: Payer: Self-pay | Admitting: Urology

## 2020-03-01 DIAGNOSIS — R350 Frequency of micturition: Secondary | ICD-10-CM

## 2020-04-27 ENCOUNTER — Other Ambulatory Visit: Payer: Self-pay | Admitting: Family Medicine

## 2020-04-27 DIAGNOSIS — I10 Essential (primary) hypertension: Secondary | ICD-10-CM

## 2020-05-05 ENCOUNTER — Encounter: Payer: Self-pay | Admitting: Family Medicine

## 2020-05-05 ENCOUNTER — Other Ambulatory Visit: Payer: Self-pay

## 2020-05-05 ENCOUNTER — Ambulatory Visit (INDEPENDENT_AMBULATORY_CARE_PROVIDER_SITE_OTHER): Payer: Medicare Other | Admitting: Family Medicine

## 2020-05-05 VITALS — BP 131/64 | HR 55 | Ht 64.0 in | Wt 127.0 lb

## 2020-05-05 DIAGNOSIS — Z23 Encounter for immunization: Secondary | ICD-10-CM | POA: Diagnosis not present

## 2020-05-05 DIAGNOSIS — I10 Essential (primary) hypertension: Secondary | ICD-10-CM

## 2020-05-05 DIAGNOSIS — R7303 Prediabetes: Secondary | ICD-10-CM | POA: Diagnosis not present

## 2020-05-05 DIAGNOSIS — M19011 Primary osteoarthritis, right shoulder: Secondary | ICD-10-CM

## 2020-05-05 DIAGNOSIS — E785 Hyperlipidemia, unspecified: Secondary | ICD-10-CM | POA: Diagnosis not present

## 2020-05-05 LAB — BAYER DCA HB A1C WAIVED: HB A1C (BAYER DCA - WAIVED): 5.7 % (ref <7.0)

## 2020-05-05 MED ORDER — METHYLPREDNISOLONE ACETATE 40 MG/ML IJ SUSP: 80.0000 mg | Freq: Once | INTRAMUSCULAR | Status: DC

## 2020-05-05 MED ORDER — HYDROCHLOROTHIAZIDE 25 MG PO TABS
25.0000 mg | ORAL_TABLET | Freq: Every day | ORAL | 3 refills | Status: DC
Start: 2020-05-05 — End: 2020-11-04

## 2020-05-05 MED ORDER — LISINOPRIL 40 MG PO TABS: 40.0000 mg | ORAL_TABLET | Freq: Every day | ORAL | 3 refills | Status: DC

## 2020-05-05 MED ORDER — ATORVASTATIN CALCIUM 40 MG PO TABS: 40.0000 mg | ORAL_TABLET | Freq: Every day | ORAL | 3 refills | Status: DC

## 2020-05-05 NOTE — Progress Notes (Signed)
BP 131/64   Pulse (!) 55   Ht _0  (1.626 m)   Wt 127 lb (57.6 kg)   SpO2 98%   BMI 21.80 kg/m    Subjective:   Patient ID: Darren Rose, male    DOB: 1931-03-06, 87 y.o.   MRN: [id number]  HPI: Darren Rose is a 87 y.o. male presenting on 05/05/2020 for Medical Management of Chronic Issues, Hyperlipidemia, and prediabetic   HPI Prediabetes Patient comes in today for recheck of his diabetes. Patient has been currently taking no medication has been diet controlled.. Patient is currently on an ACE inhibitor/ARB. Patient has not seen an ophthalmologist this year. Patient denies any issues with their feet. The symptom started onset as an adult hypertension ARE RELATED TO DM   Patient is having difficulty sleeping, has been taking NyQuil sometimes.  Has not really tried anything else, recommended for him to try melatonin  Hypertension Patient is currently on lisinopril hydrochlorothiazide, and their blood pressure today is 131/64. Patient denies any lightheadedness or dizziness. Patient denies headaches, blurred vision, chest pains, shortness of breath, or weakness. Denies any side effects from medication and is content with current medication.   Patient has been having continued right shoulder issues, has difficulty with overhead range of motion.  He has pain in the right shoulder especially in the center.  Relevant past medical, surgical, family and social history reviewed and updated as indicated. Interim medical history since our last visit reviewed. Allergies and medications reviewed and updated.  Review of Systems  Constitutional: Negative for chills and fever.  Respiratory: Negative for shortness of breath and wheezing.   Cardiovascular: Negative for chest pain and leg swelling.  Musculoskeletal: Positive for arthralgias. Negative for back pain and gait problem.  Skin: Negative for rash.  Psychiatric/Behavioral: Positive for sleep disturbance.  All other systems  reviewed and are negative.   Per HPI unless specifically indicated above   Allergies as of 05/05/2020   No Known Allergies     Medication List       Accurate as of May 05, 2020 11:29 AM. If you have any questions, ask your nurse or doctor.        alfuzosin 10 MG 24 hr tablet Commonly known as: UROXATRAL Take 1 tablet (10 mg total) by mouth daily.   aspirin EC 81 MG tablet Take 81 mg by mouth daily.   atorvastatin 40 MG tablet Commonly known as: LIPITOR Take 1 tablet (40 mg total) by mouth daily at 6 PM.   diclofenac sodium 1 % Gel Commonly known as: VOLTAREN APPLY 1 SMALL AMOUNT TO SKIN EVERY TWELVE HOURS   finasteride 5 MG tablet Commonly known as: PROSCAR Take 1 tablet (5 mg total) by mouth daily.   hydrochlorothiazide 25 MG tablet Commonly known as: HYDRODIURIL Take 1 tablet (25 mg total) by mouth daily. What changed: See the new instructions. Changed by: Fransisca Kaufmann Demitra Danley, MD   ketoconazole 2 % cream Commonly known as: NIZORAL Apply topically.   lisinopril 40 MG tablet Commonly known as: ZESTRIL Take 1 tablet (40 mg total) by mouth daily.   methocarbamol 500 MG tablet Commonly known as: Robaxin Take 1 tablet (500 mg total) by mouth every 8 (eight) hours as needed for muscle spasms.   mirabegron ER 50 MG Tb24 tablet Commonly known as: Myrbetriq TAKE 1 TABLET BY MOUTH EVERY DAY   Myrbetriq 50 MG Tb24 tablet Generic drug: mirabegron ER TOME UNA TABLETA TODOS LOS D?AS  Objective:   BP 131/64   Pulse (!) 55   Ht _0  (1.626 m)   Wt 127 lb (57.6 kg)   SpO2 98%   BMI 21.80 kg/m   Wt Readings from Last 3 Encounters:  05/05/20 127 lb (57.6 kg)  02/04/20 125 lb (56.7 kg)  11/03/19 125 lb (56.7 kg)    Physical Exam Vitals and nursing note reviewed.  Constitutional:      General: He is not in acute distress.    Appearance: He is well-developed. He is not diaphoretic.  Eyes:     General: No scleral icterus.    Conjunctiva/sclera:  Conjunctivae normal.  Neck:     Thyroid: No thyromegaly.  Cardiovascular:     Rate and Rhythm: Normal rate and regular rhythm.     Heart sounds: Normal heart sounds. No murmur heard.   Pulmonary:     Effort: Pulmonary effort is normal. No respiratory distress.     Breath sounds: Normal breath sounds. No wheezing.  Musculoskeletal:     Right shoulder: Tenderness (Anterior lateral tenderness) and crepitus present. No swelling, deformity or bony tenderness. Decreased range of motion (Limited to about 100 degrees abduction). Normal strength.     Cervical back: Neck supple.  Lymphadenopathy:     Cervical: No cervical adenopathy.  Skin:    General: Skin is warm and dry.     Findings: No rash.  Neurological:     Mental Status: He is alert and oriented to person, place, and time.     Coordination: Coordination normal.  Psychiatric:        Behavior: Behavior normal.     Right subacromial injection: Consent form signed. Risk factors of bleeding and infection discussed with patient and patient is agreeable towards injection. Patient prepped with Betadine.  Posterior approach towards injection used. Injected 80 mg of Depo-Medrol and 1 mL of 2% lidocaine. Patient tolerated procedure well and no side effects from noted. Minimal to no bleeding. Simple bandage applied after.   Assessment & Plan:   Problem List Items Addressed This Visit      Cardiovascular and Mediastinum   Essential hypertension, benign - Primary   Relevant Medications   atorvastatin (LIPITOR) 40 MG tablet   hydrochlorothiazide (HYDRODIURIL) 25 MG tablet   lisinopril (ZESTRIL) 40 MG tablet   Other Relevant Orders   CBC with Differential/Platelet   CMP14+EGFR     Other   Hyperlipidemia with target LDL less than 100   Relevant Medications   atorvastatin (LIPITOR) 40 MG tablet   hydrochlorothiazide (HYDRODIURIL) 25 MG tablet   lisinopril (ZESTRIL) 40 MG tablet   Other Relevant Orders   Lipid panel   Prediabetes    Relevant Orders   Bayer DCA Hb A1c Waived    Other Visit Diagnoses    Primary osteoarthritis of right shoulder       Relevant Medications   methylPREDNISolone acetate (DEPO-MEDROL) injection 80 mg (Start on 05/05/2020 11:30 AM)    Give right shoulder injection, continue current medication, will do blood work today.  Follow up plan: Return in about 6 months (around 11/04/2020), or if symptoms worsen or fail to improve, for Prediabetes and hypertension and cholesterol.  Counseling provided for all of the vaccine components Orders Placed This Encounter  Procedures  . Pneumococcal conjugate vaccine 13-valent  . Bayer DCA Hb A1c Waived  . CBC with Differential/Platelet  . CMP14+EGFR  . Lipid panel    Caryl Pina, MD [facility] 05/05/2020, 11:29 AM

## 2020-05-06 LAB — LIPID PANEL
Chol/HDL Ratio: 2.5 ratio (ref 0.0–5.0)
Cholesterol, Total: 119 mg/dL (ref 100–199)
HDL: 47 mg/dL (ref >39)
LDL Chol Calc (NIH): 59 mg/dL (ref 0–99)
Triglycerides: 56 mg/dL (ref 0–149)
VLDL Cholesterol Cal: 13 mg/dL (ref 5–40)

## 2020-05-06 LAB — CBC WITH DIFFERENTIAL/PLATELET
Basophils Absolute: 0.1 10*3/uL (ref 0.0–0.2)
Basos: 1 %
EOS (ABSOLUTE): 0.3 10*3/uL (ref 0.0–0.4)
Eos: 5 %
Hematocrit: 38.7 % (ref 37.5–51.0)
Hemoglobin: 13.1 g/dL (ref 13.0–17.7)
Immature Grans (Abs): 0 10*3/uL (ref 0.0–0.1)
Immature Granulocytes: 0 %
Lymphocytes Absolute: 2 10*3/uL (ref 0.7–3.1)
Lymphs: 31 %
MCH: 31.4 pg (ref 26.6–33.0)
MCHC: 33.9 g/dL (ref 31.5–35.7)
MCV: 93 fL (ref 79–97)
Monocytes Absolute: 0.5 10*3/uL (ref 0.1–0.9)
Monocytes: 8 %
Neutrophils Absolute: 3.6 10*3/uL (ref 1.4–7.0)
Neutrophils: 55 %
Platelets: 201 10*3/uL (ref 150–450)
RBC: 4.17 x10E6/uL (ref 4.14–5.80)
RDW: 12.9 % (ref 11.6–15.4)
WBC: 6.5 10*3/uL (ref 3.4–10.8)

## 2020-05-06 LAB — CMP14+EGFR
ALT: 15 IU/L (ref 0–44)
AST: 23 IU/L (ref 0–40)
Albumin/Globulin Ratio: 1.6 (ref 1.2–2.2)
Albumin: 4.2 g/dL (ref 3.6–4.6)
Alkaline Phosphatase: 89 IU/L (ref 44–121)
BUN/Creatinine Ratio: 23 (ref 10–24)
BUN: 20 mg/dL (ref 8–27)
Bilirubin Total: 0.4 mg/dL (ref 0.0–1.2)
CO2: 25 mmol/L (ref 20–29)
Calcium: 9.6 mg/dL (ref 8.6–10.2)
Chloride: 97 mmol/L (ref 96–106)
Creatinine, Ser: 0.88 mg/dL (ref 0.76–1.27)
Globulin, Total: 2.7 g/dL (ref 1.5–4.5)
Glucose: 97 mg/dL (ref 65–99)
Potassium: 4.3 mmol/L (ref 3.5–5.2)
Sodium: 140 mmol/L (ref 134–144)
Total Protein: 6.9 g/dL (ref 6.0–8.5)
eGFR: 83 mL/min/{1.73_m2} (ref >59)

## 2020-05-13 DIAGNOSIS — H905 Unspecified sensorineural hearing loss: Secondary | ICD-10-CM | POA: Diagnosis not present

## 2020-05-18 ENCOUNTER — Encounter: Payer: Self-pay | Admitting: Family Medicine

## 2020-05-19 DIAGNOSIS — Z961 Presence of intraocular lens: Secondary | ICD-10-CM | POA: Diagnosis not present

## 2020-06-01 DIAGNOSIS — I70203 Unspecified atherosclerosis of native arteries of extremities, bilateral legs: Secondary | ICD-10-CM | POA: Diagnosis not present

## 2020-06-01 DIAGNOSIS — L84 Corns and callosities: Secondary | ICD-10-CM | POA: Diagnosis not present

## 2020-06-07 ENCOUNTER — Other Ambulatory Visit: Payer: Self-pay | Admitting: Urology

## 2020-06-07 DIAGNOSIS — R350 Frequency of micturition: Secondary | ICD-10-CM

## 2020-09-03 ENCOUNTER — Other Ambulatory Visit: Payer: Self-pay

## 2020-09-03 ENCOUNTER — Encounter: Payer: Self-pay | Admitting: Nurse Practitioner

## 2020-09-03 ENCOUNTER — Ambulatory Visit (INDEPENDENT_AMBULATORY_CARE_PROVIDER_SITE_OTHER): Payer: Medicare Other | Admitting: Nurse Practitioner

## 2020-09-03 VITALS — BP 134/69 | HR 60 | Temp 97.7°F | Resp 20 | Ht 64.0 in | Wt 126.0 lb

## 2020-09-03 DIAGNOSIS — I10 Essential (primary) hypertension: Secondary | ICD-10-CM | POA: Diagnosis not present

## 2020-09-03 NOTE — Patient Instructions (Signed)
Hypertension, Adult Hypertension is another name for high blood pressure. High blood pressure forces your heart to work harder to pump blood. This can cause problems overtime. There are two numbers in a blood pressure reading. There is a top number (systolic) over a bottom number (diastolic). It is best to have a blood pressure that is below 120/80. Healthy choicescan help lower your blood pressure, or you may need medicine to help lower it. What are the causes? The cause of this condition is not known. Some conditions may be related tohigh blood pressure. What increases the risk? Smoking. Having type 2 diabetes mellitus, high cholesterol, or both. Not getting enough exercise or physical activity. Being overweight. Having too much fat, sugar, calories, or salt (sodium) in your diet. Drinking too much alcohol. Having long-term (chronic) kidney disease. Having a family history of high blood pressure. Age. Risk increases with age. Race. You may be at higher risk if you are African American. Gender. Men are at higher risk than women before age 20. After age 72, women are at higher risk than men. Having obstructive sleep apnea. Stress. What are the signs or symptoms? High blood pressure may not cause symptoms. Very high blood pressure (hypertensive crisis) may cause: Headache. Feelings of worry or nervousness (anxiety). Shortness of breath. Nosebleed. A feeling of being sick to your stomach (nausea). Throwing up (vomiting). Changes in how you see. Very bad chest pain. Seizures. How is this treated? This condition is treated by making healthy lifestyle changes, such as: Eating healthy foods. Exercising more. Drinking less alcohol. Your health care provider may prescribe medicine if lifestyle changes are not enough to get your blood pressure under control, and if: Your top number is above 130. Your bottom number is above 80. Your personal target blood pressure may vary. Follow these  instructions at home: Eating and drinking  If told, follow the DASH eating plan. To follow this plan: Fill one half of your plate at each meal with fruits and vegetables. Fill one fourth of your plate at each meal with whole grains. Whole grains include whole-wheat pasta, brown rice, and whole-grain bread. Eat or drink low-fat dairy products, such as skim milk or low-fat yogurt. Fill one fourth of your plate at each meal with low-fat (lean) proteins. Low-fat proteins include fish, chicken without skin, eggs, beans, and tofu. Avoid fatty meat, cured and processed meat, or chicken with skin. Avoid pre-made or processed food. Eat less than 1,500 mg of salt each day. Do not drink alcohol if: Your doctor tells you not to drink. You are pregnant, may be pregnant, or are planning to become pregnant. If you drink alcohol: Limit how much you use to: 0-1 drink a day for women. 0-2 drinks a day for men. Be aware of how much alcohol is in your drink. In the [place] one drink equals one 12 oz bottle of beer (355 mL), one 5 oz glass of wine (148 mL), or one 1 oz glass of hard liquor (44 mL).  Lifestyle  Work with your doctor to stay at a healthy weight or to lose weight. Ask your doctor what the best weight is for you. Get at least 30 minutes of exercise most days of the week. This may include walking, swimming, or biking. Get at least 30 minutes of exercise that strengthens your muscles (resistance exercise) at least 3 days a week. This may include lifting weights or doing Pilates. Do not use any products that contain nicotine or tobacco, such as cigarettes,  e-cigarettes, and chewing tobacco. If you need help quitting, ask your doctor. Check your blood pressure at home as told by your doctor. Keep all follow-up visits as told by your doctor. This is important.  Medicines Take over-the-counter and prescription medicines only as told by your doctor. Follow directions carefully. Do not skip doses of  blood pressure medicine. The medicine does not work as well if you skip doses. Skipping doses also puts you at risk for problems. Ask your doctor about side effects or reactions to medicines that you should watch for. Contact a doctor if you: Think you are having a reaction to the medicine you are taking. Have headaches that keep coming back (recurring). Feel dizzy. Have swelling in your ankles. Have trouble with your vision. Get help right away if you: Get a very bad headache. Start to feel mixed up (confused). Feel weak or numb. Feel faint. Have very bad pain in your: Chest. Belly (abdomen). Throw up more than once. Have trouble breathing. Summary Hypertension is another name for high blood pressure. High blood pressure forces your heart to work harder to pump blood. For most people, a normal blood pressure is less than 120/80. Making healthy choices can help lower blood pressure. If your blood pressure does not get lower with healthy choices, you may need to take medicine. This information is not intended to replace advice given to you by your health care provider. Make sure you discuss any questions you have with your healthcare provider. Document Revised: 09/26/2017 Document Reviewed: 09/26/2017 Elsevier Patient Education  [place]. Blood Pressure Record Sheet To take your blood pressure, you will need a blood pressure machine. You can buy a blood pressure machine (blood pressure monitor) at your clinic, drug store, or online. When choosing one, consider: An automatic monitor that has an arm cuff. A cuff that wraps snugly around your upper arm. You should be able to fit only one finger between your arm and the cuff. A device that stores blood pressure reading results. Do not choose a monitor that measures your blood pressure from your wrist or finger. Follow your health care provider's instructions for how to take your blood pressure. To use this form: Get one reading  in the morning (a.m.) before you take any medicines. Get one reading in the evening (p.m.) before supper. Take at least 2 readings with each blood pressure check. This makes sure the results are correct. Wait 1-2 minutes between measurements. Write down the results in the spaces on this form. Repeat this once a week, or as told by your health care provider. Make a follow-up appointment with your health care provider to discuss the results. Blood pressure log Date: _______________________ a.m. _____________________(1st reading) _____________________(2nd reading) p.m. _____________________(1st reading) _____________________(2nd reading) Date: _______________________ a.m. _____________________(1st reading) _____________________(2nd reading) p.m. _____________________(1st reading) _____________________(2nd reading) Date: _______________________ a.m. _____________________(1st reading) _____________________(2nd reading) p.m. _____________________(1st reading) _____________________(2nd reading) Date: _______________________ a.m. _____________________(1st reading) _____________________(2nd reading) p.m. _____________________(1st reading) _____________________(2nd reading) Date: _______________________ a.m. _____________________(1st reading) _____________________(2nd reading) p.m. _____________________(1st reading) _____________________(2nd reading) This information is not intended to replace advice given to you by your health care provider. Make sure you discuss any questions you have with your healthcare provider. Document Revised: 05/07/2019 Document Reviewed: 05/07/2019 Elsevier Patient Education  [place].

## 2020-09-03 NOTE — Assessment & Plan Note (Signed)
Patient is following up today for changes in blood pressure readings at home, pt is reporting worsening BP intermittently at home and has been consistently taking his blood pressure medication. No headache, lightheadedness, dizziness, nausea or vomiting associated with elevated blood pressure.Education provided to patient and daughter on maintaining good blood pressure by reducing salt in diet, staying hydrated and keeping a blood pressure log as this is not consistent. patient is advised to return to clinic in about a week, bring blood pressure patient for calibration. daughter was with patient and verbalized understanding. No changes to Blood pressure medications today.
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
## 2020-09-05 ENCOUNTER — Other Ambulatory Visit: Payer: Self-pay | Admitting: Urology

## 2020-09-05 DIAGNOSIS — R350 Frequency of micturition: Secondary | ICD-10-CM

## 2020-09-16 ENCOUNTER — Other Ambulatory Visit: Payer: Self-pay

## 2020-09-16 ENCOUNTER — Ambulatory Visit (INDEPENDENT_AMBULATORY_CARE_PROVIDER_SITE_OTHER): Payer: Medicare Other | Admitting: Family Medicine

## 2020-09-16 ENCOUNTER — Encounter: Payer: Self-pay | Admitting: Family Medicine

## 2020-09-16 VITALS — BP 130/61 | HR 54 | Ht 64.0 in | Wt 127.0 lb

## 2020-09-16 DIAGNOSIS — M19011 Primary osteoarthritis, right shoulder: Secondary | ICD-10-CM | POA: Diagnosis not present

## 2020-09-16 MED ORDER — METHYLPREDNISOLONE ACETATE 80 MG/ML IJ SUSP
80.0000 mg | Freq: Once | INTRAMUSCULAR | Status: AC
  Administered 2020-09-16: 80 mg via INTRAMUSCULAR

## 2020-09-16 NOTE — Progress Notes (Signed)
BP 130/61   Pulse (!) 54   Ht '5\' 4"'$  (1.626 m)   Wt 127 lb (57.6 kg)   SpO2 96%   BMI 21.80 kg/m    Subjective:   Patient ID: Darren Rose, male    DOB: 04/05/1931, 87 y.o.   MRN: [id number]  HPI: Darren Rose is a 87 y.o. male presenting on 09/16/2020 for Shoulder Pain (Right- request injection)   HPI Patient is coming in for right shoulder pain.  He had an injection 4 months ago and is coming in for repeat on that.  He says it gave him 4 months of relief and he was doing really well and wants to get it again.  He says he was able to work and move his arm next to  Relevant past medical, surgical, family and social history reviewed and updated as indicated. Interim medical history since our last visit reviewed. Allergies and medications reviewed and updated.  Review of Systems  Constitutional:  Negative for chills and fever.  Respiratory:  Negative for shortness of breath and wheezing.   Cardiovascular:  Negative for chest pain and leg swelling.  Musculoskeletal:  Positive for arthralgias and myalgias.  Skin:  Negative for rash.  All other systems reviewed and are negative.  Per HPI unless specifically indicated above   Allergies as of 09/16/2020   No Known Allergies      Medication List        Accurate as of September 16, 2020 11:48 AM. If you have any questions, ask your nurse or doctor.          alfuzosin 10 MG 24 hr tablet Commonly known as: UROXATRAL Take 1 tablet (10 mg total) by mouth daily.   aspirin EC 81 MG tablet Take 81 mg by mouth daily.   atorvastatin 40 MG tablet Commonly known as: LIPITOR Take 1 tablet (40 mg total) by mouth daily at 6 PM.   diclofenac sodium 1 % Gel Commonly known as: VOLTAREN APPLY 1 SMALL AMOUNT TO SKIN EVERY TWELVE HOURS   finasteride 5 MG tablet Commonly known as: PROSCAR Take 1 tablet (5 mg total) by mouth daily.   hydrochlorothiazide 25 MG tablet Commonly known as: HYDRODIURIL Take 1 tablet (25 mg  total) by mouth daily.   ketoconazole 2 % cream Commonly known as: NIZORAL Apply topically.   lisinopril 40 MG tablet Commonly known as: ZESTRIL Take 1 tablet (40 mg total) by mouth daily.   Myrbetriq 50 MG Tb24 tablet Generic drug: mirabegron ER TAKE 1 TABLET BY MOUTH EVERY DAY         Objective:   BP 130/61   Pulse (!) 54   Ht '5\' 4"'$  (1.626 m)   Wt 127 lb (57.6 kg)   SpO2 96%   BMI 21.80 kg/m   Wt Readings from Last 3 Encounters:  09/16/20 127 lb (57.6 kg)  09/03/20 126 lb (57.2 kg)  05/05/20 127 lb (57.6 kg)    Physical Exam Vitals and nursing note reviewed.  Constitutional:      General: He is not in acute distress.    Appearance: He is well-developed. He is not diaphoretic.  Eyes:     General: No scleral icterus.    Conjunctiva/sclera: Conjunctivae normal.  Neck:     Thyroid: No thyromegaly.  Musculoskeletal:        General: Normal range of motion.     Right shoulder: Crepitus present. No deformity, tenderness or bony tenderness. Normal range of motion.  Skin:  General: Skin is warm and dry.     Findings: No rash.  Neurological:     Mental Status: He is alert and oriented to person, place, and time.     Coordination: Coordination normal.  Psychiatric:        Behavior: Behavior normal.    Right shoulder subacromial injection: Consent form signed. Risk factors of bleeding and infection discussed with patient and patient is agreeable towards injection. Patient prepped with Betadine.  Posterior approach towards injection used. Injected 80 mg of Depo-Medrol and 1 mL of 2% lidocaine. Patient tolerated procedure well and no side effects from noted. Minimal to no bleeding. Simple bandage applied after.   Assessment & Plan:   Problem List Items Addressed This Visit   None Visit Diagnoses     Primary osteoarthritis of right shoulder    -  Primary   Relevant Medications   methylPREDNISolone acetate (DEPO-MEDROL) injection 80 mg (Completed)         Follow up plan: Return if symptoms worsen or fail to improve.  Counseling provided for all of the vaccine components No orders of the defined types were placed in this encounter.   Caryl Pina, MD [facility] 09/16/2020, 11:48 AM

## 2020-10-05 DIAGNOSIS — M79674 Pain in right toe(s): Secondary | ICD-10-CM | POA: Diagnosis not present

## 2020-10-05 DIAGNOSIS — M2041 Other hammer toe(s) (acquired), right foot: Secondary | ICD-10-CM | POA: Diagnosis not present

## 2020-10-26 DIAGNOSIS — L308 Other specified dermatitis: Secondary | ICD-10-CM | POA: Diagnosis not present

## 2020-11-04 ENCOUNTER — Other Ambulatory Visit: Payer: Self-pay

## 2020-11-04 ENCOUNTER — Ambulatory Visit (INDEPENDENT_AMBULATORY_CARE_PROVIDER_SITE_OTHER): Payer: Medicare Other | Admitting: Family Medicine

## 2020-11-04 ENCOUNTER — Encounter: Payer: Self-pay | Admitting: Family Medicine

## 2020-11-04 VITALS — BP 82/55 | HR 86 | Temp 98.0°F | Ht 64.0 in | Wt 125.0 lb

## 2020-11-04 DIAGNOSIS — E785 Hyperlipidemia, unspecified: Secondary | ICD-10-CM | POA: Diagnosis not present

## 2020-11-04 DIAGNOSIS — Z125 Encounter for screening for malignant neoplasm of prostate: Secondary | ICD-10-CM

## 2020-11-04 DIAGNOSIS — R7303 Prediabetes: Secondary | ICD-10-CM | POA: Diagnosis not present

## 2020-11-04 DIAGNOSIS — Z23 Encounter for immunization: Secondary | ICD-10-CM | POA: Diagnosis not present

## 2020-11-04 DIAGNOSIS — I10 Essential (primary) hypertension: Secondary | ICD-10-CM

## 2020-11-04 LAB — BAYER DCA HB A1C WAIVED: HB A1C (BAYER DCA - WAIVED): 5.5 % (ref 4.8–5.6)

## 2020-11-04 NOTE — Progress Notes (Signed)
BP (!) 82/55   Pulse 86   Temp 98 F (36.7 C)   Ht _0  (1.626 m)   Wt 125 lb (56.7 kg)   SpO2 96%   BMI 21.46 kg/m    Subjective:   Patient ID: Darren Rose, male    DOB: 1931/10/29, 87 y.o.   MRN: [id number]  HPI: Darren Rose is a 87 y.o. male presenting on 11/04/2020 for Medical Management of Chronic Issues   HPI Hypertension Patient is currently on lisinopril and hctz, and their blood pressure today is 82/55. Patient denies any lightheadedness or dizziness. Patient denies headaches, blurred vision, chest pains, shortness of breath, or weakness. Denies any side effects from medication and is content with current medication.   Hyperlipidemia Patient is coming in for recheck of his hyperlipidemia. The patient is currently taking atorvastatin. They deny any issues with myalgias or history of liver damage from it. They deny any focal numbness or weakness or chest pain.   Prediabetes  patient comes in today for recheck of his diabetes. Patient has been currently taking no medications. Patient is currently on an ACE inhibitor/ARB. Patient has seen an ophthalmologist this year. Patient denies any issues with their feet. The symptom started onset as an adult htn and hld ARE RELATED TO DM   Relevant past medical, surgical, family and social history reviewed and updated as indicated. Interim medical history since our last visit reviewed. Allergies and medications reviewed and updated.  Review of Systems  Constitutional:  Negative for chills and fever.  Eyes:  Negative for visual disturbance.  Respiratory:  Negative for shortness of breath and wheezing.   Cardiovascular:  Negative for chest pain and leg swelling.  Musculoskeletal:  Positive for arthralgias. Negative for back pain and gait problem.  Skin:  Negative for rash.  Neurological:  Negative for dizziness, weakness and light-headedness.  All other systems reviewed and are negative.  Per HPI unless specifically  indicated above   Allergies as of 11/04/2020   No Known Allergies      Medication List        Accurate as of November 04, 2020 11:32 AM. If you have any questions, ask your nurse or doctor.          alfuzosin 10 MG 24 hr tablet Commonly known as: UROXATRAL Take 1 tablet (10 mg total) by mouth daily.   aspirin EC 81 MG tablet Take 81 mg by mouth daily.   atorvastatin 40 MG tablet Commonly known as: LIPITOR Take 1 tablet (40 mg total) by mouth daily at 6 PM.   diclofenac sodium 1 % Gel Commonly known as: VOLTAREN APPLY 1 SMALL AMOUNT TO SKIN EVERY TWELVE HOURS   finasteride 5 MG tablet Commonly known as: PROSCAR Take 1 tablet (5 mg total) by mouth daily.   hydrochlorothiazide 25 MG tablet Commonly known as: HYDRODIURIL Take 1 tablet (25 mg total) by mouth daily.   ketoconazole 2 % cream Commonly known as: NIZORAL Apply topically.   lisinopril 40 MG tablet Commonly known as: ZESTRIL Take 1 tablet (40 mg total) by mouth daily.   Myrbetriq 50 MG Tb24 tablet Generic drug: mirabegron ER TAKE 1 TABLET BY MOUTH EVERY DAY         Objective:   Temp 98 F (36.7 C)   Ht _1  (1.626 m)   Wt 125 lb (56.7 kg)   BMI 21.46 kg/m   Wt Readings from Last 3 Encounters:  11/04/20 125 lb (56.7 kg)  09/16/20 127  lb (57.6 kg)  09/03/20 126 lb (57.2 kg)    Physical Exam Vitals and nursing note reviewed.  Constitutional:      General: He is not in acute distress.    Appearance: He is well-developed. He is not diaphoretic.  Eyes:     General: No scleral icterus.    Conjunctiva/sclera: Conjunctivae normal.  Neck:     Thyroid: No thyromegaly.  Cardiovascular:     Rate and Rhythm: Normal rate and regular rhythm.     Heart sounds: Normal heart sounds. No murmur heard. Pulmonary:     Effort: Pulmonary effort is normal. No respiratory distress.     Breath sounds: Normal breath sounds. No wheezing.  Musculoskeletal:        General: Normal range of motion.      Cervical back: Neck supple.  Lymphadenopathy:     Cervical: No cervical adenopathy.  Skin:    General: Skin is warm and dry.     Findings: No rash.  Neurological:     Mental Status: He is alert and oriented to person, place, and time.     Coordination: Coordination normal.  Psychiatric:        Behavior: Behavior normal.      Assessment & Plan:   Problem List Items Addressed This Visit       Cardiovascular and Mediastinum   Essential hypertension, benign     Other   Hyperlipidemia with target LDL less than 100   Relevant Orders   Lipid panel   Prediabetes - Primary   Relevant Orders   Bayer DCA Hb A1c Waived   CMP14+EGFR   CBC with Differential/Platelet   Microalbumin / creatinine urine ratio   Other Visit Diagnoses     Prostate cancer screening       Relevant Orders   PSA, total and free       Right shoulder pain still causing issues but he is not due for injections yet, it only been 2 months, will need to wait a least 2 more months for an injection Will stop hydrochlorothiazide because of low blood pressure Follow up plan: Return in about 6 months (around 05/05/2021), or if symptoms worsen or fail to improve, for Prediabetes hypertension cholesterol recheck.  Counseling provided for all of the vaccine components Orders Placed This Encounter  Procedures   Bayer [place] Hb A1c Waived   CMP14+EGFR   CBC with Differential/Platelet   Lipid panel   PSA, total and free   Microalbumin / creatinine urine ratio    Caryl Pina, MD [facility] 11/04/2020, 11:32 AM

## 2020-11-05 LAB — CMP14+EGFR
ALT: 16 IU/L (ref 0–44)
AST: 21 IU/L (ref 0–40)
Albumin/Globulin Ratio: 1.4 (ref 1.2–2.2)
Albumin: 3.7 g/dL (ref 3.6–4.6)
Alkaline Phosphatase: 76 IU/L (ref 44–121)
BUN/Creatinine Ratio: 19 (ref 10–24)
BUN: 17 mg/dL (ref 8–27)
Bilirubin Total: 0.5 mg/dL (ref 0.0–1.2)
CO2: 24 mmol/L (ref 20–29)
Calcium: 9.6 mg/dL (ref 8.6–10.2)
Chloride: 98 mmol/L (ref 96–106)
Creatinine, Ser: 0.91 mg/dL (ref 0.76–1.27)
Globulin, Total: 2.7 g/dL (ref 1.5–4.5)
Glucose: 107 mg/dL — ABNORMAL HIGH (ref 70–99)
Potassium: 3.4 mmol/L — ABNORMAL LOW (ref 3.5–5.2)
Sodium: 139 mmol/L (ref 134–144)
Total Protein: 6.4 g/dL (ref 6.0–8.5)
eGFR: 82 mL/min/{1.73_m2} (ref >59)

## 2020-11-05 LAB — CBC WITH DIFFERENTIAL/PLATELET
Basophils Absolute: 0.1 10*3/uL (ref 0.0–0.2)
Basos: 1 %
EOS (ABSOLUTE): 0.1 10*3/uL (ref 0.0–0.4)
Eos: 1 %
Hematocrit: 39.7 % (ref 37.5–51.0)
Hemoglobin: 13.1 g/dL (ref 13.0–17.7)
Immature Grans (Abs): 0 10*3/uL (ref 0.0–0.1)
Immature Granulocytes: 0 %
Lymphocytes Absolute: 1.6 10*3/uL (ref 0.7–3.1)
Lymphs: 19 %
MCH: 31.1 pg (ref 26.6–33.0)
MCHC: 33 g/dL (ref 31.5–35.7)
MCV: 94 fL (ref 79–97)
Monocytes Absolute: 0.6 10*3/uL (ref 0.1–0.9)
Monocytes: 7 %
Neutrophils Absolute: 6 10*3/uL (ref 1.4–7.0)
Neutrophils: 72 %
Platelets: 279 10*3/uL (ref 150–450)
RBC: 4.21 x10E6/uL (ref 4.14–5.80)
RDW: 13.1 % (ref 11.6–15.4)
WBC: 8.3 10*3/uL (ref 3.4–10.8)

## 2020-11-05 LAB — MICROALBUMIN / CREATININE URINE RATIO
Creatinine, Urine: 127.8 mg/dL
Microalb/Creat Ratio: 14 mg/g creat (ref 0–29)
Microalbumin, Urine: 17.3 ug/mL

## 2020-11-05 LAB — LIPID PANEL
Chol/HDL Ratio: 2.4 ratio (ref 0.0–5.0)
Cholesterol, Total: 114 mg/dL (ref 100–199)
HDL: 47 mg/dL (ref >39)
LDL Chol Calc (NIH): 54 mg/dL (ref 0–99)
Triglycerides: 55 mg/dL (ref 0–149)
VLDL Cholesterol Cal: 13 mg/dL (ref 5–40)

## 2020-11-05 LAB — PSA, TOTAL AND FREE
PSA, Free Pct: 22.9 %
PSA, Free: 0.16 ng/mL
Prostate Specific Ag, Serum: 0.7 ng/mL (ref 0.0–4.0)

## 2020-11-16 ENCOUNTER — Ambulatory Visit (INDEPENDENT_AMBULATORY_CARE_PROVIDER_SITE_OTHER): Payer: Medicare Other | Admitting: Nurse Practitioner

## 2020-11-16 ENCOUNTER — Encounter: Payer: Self-pay | Admitting: Nurse Practitioner

## 2020-11-16 ENCOUNTER — Ambulatory Visit: Payer: Medicare Other | Admitting: Nurse Practitioner

## 2020-11-16 VITALS — BP 173/70 | HR 57 | Temp 97.5°F | Ht 64.0 in | Wt 131.0 lb

## 2020-11-16 DIAGNOSIS — I10 Essential (primary) hypertension: Secondary | ICD-10-CM | POA: Diagnosis not present

## 2020-11-16 DIAGNOSIS — M79642 Pain in left hand: Secondary | ICD-10-CM

## 2020-11-16 NOTE — Assessment & Plan Note (Signed)
Elevated systolic blood pressure.  patient was seen 11/04/2020 and blood pressure at the time was very low, patients Blood pressure medication was changed and patient also started taking lisinopril 40 mg daily. Patient is not taking medication as prescribed but took BP medication  every other day. patient is in clinic today with a BP of 173/70. Care giver also mentioned that patient has been taking Nyquil for sleep. I provided education to patient to take blood pressure medication as prescribed, and stop Nyquil which can increase blood pressure readings. I advised care giver and patient that melatonin may be a better option for sleep. Patient is not reporting any new symptoms of hypertension and knows to follow up with worsening unresolved symptoms.

## 2020-11-16 NOTE — Patient Instructions (Signed)
Hand Pain Many things can cause hand pain. Some common causes are: An injury. Repeating the same movement with your hand over and over (overuse). Osteoporosis. Arthritis. Lumps in the tendons or joints of the hand and wrist (ganglion cysts). Nerve compression syndromes (carpal tunnel syndrome). Inflammation of the tendons (tendinitis). Infection. Follow these instructions at home: Pay attention to any changes in your symptoms. Take these actions to help with your discomfort: Managing pain, stiffness, and swelling  Take over-the-counter and prescription medicines only as told by your health care provider. Wear a hand splint or support as told by your health care provider. If directed, put ice on the affected area: Put ice in a plastic bag. Place a towel between your skin and the bag. Leave the ice on for 20 minutes, 2-3 times a day. Activity Take breaks from repetitive activity often. Avoid activities that make your pain worse. Minimize stress on your hands and wrists as much as possible. Do stretches or exercises as told by your health care provider. Do not do activities that make your pain worse. Contact a health care provider if: Your pain does not get better after a few days of self-care. Your pain gets worse. Your pain affects your ability to do your daily activities. Get help right away if: Your hand becomes warm, red, or swollen. Your hand is numb or tingling. Your hand is extremely swollen or deformed. Your hand or fingers turn white or blue. You cannot move your hand, wrist, or fingers. Summary Many things can cause hand pain. Contact your health care provider if your pain does not get better after a few days of self care. Minimize stress on your hands and wrists as much as possible. Do not do activities that make your pain worse. This information is not intended to replace advice given to you by your health care provider. Make sure you discuss any questions you have  with your health care provider. Document Revised: 05/06/2020 Document Reviewed: 05/06/2020 Elsevier Patient Education  2022 [place]. Hypertension, Adult Hypertension is another name for high blood pressure. High blood pressure forces your heart to work harder to pump blood. This can cause problems over time. There are two numbers in a blood pressure reading. There is a top number (systolic) over a bottom number (diastolic). It is best to have a blood pressure that is below 120/80. Healthy choices can help lower your blood pressure, or you may need medicine to help lower it. What are the causes? The cause of this condition is not known. Some conditions may be related to high blood pressure. What increases the risk? Smoking. Having type 2 diabetes mellitus, high cholesterol, or both. Not getting enough exercise or physical activity. Being overweight. Having too much fat, sugar, calories, or salt (sodium) in your diet. Drinking too much alcohol. Having long-term (chronic) kidney disease. Having a family history of high blood pressure. Age. Risk increases with age. Race. You may be at higher risk if you are African American. Gender. Men are at higher risk than women before age 3. After age 75, women are at higher risk than men. Having obstructive sleep apnea. Stress. What are the signs or symptoms? High blood pressure may not cause symptoms. Very high blood pressure (hypertensive crisis) may cause: Headache. Feelings of worry or nervousness (anxiety). Shortness of breath. Nosebleed. A feeling of being sick to your stomach (nausea). Throwing up (vomiting). Changes in how you see. Very bad chest pain. Seizures. How is this treated? This condition is  treated by making healthy lifestyle changes, such as: Eating healthy foods. Exercising more. Drinking less alcohol. Your health care provider may prescribe medicine if lifestyle changes are not enough to get your blood pressure  under control, and if: Your top number is above 130. Your bottom number is above 80. Your personal target blood pressure may vary. Follow these instructions at home: Eating and drinking  If told, follow the DASH eating plan. To follow this plan: Fill one half of your plate at each meal with fruits and vegetables. Fill one fourth of your plate at each meal with whole grains. Whole grains include whole-wheat pasta, brown rice, and whole-grain bread. Eat or drink low-fat dairy products, such as skim milk or low-fat yogurt. Fill one fourth of your plate at each meal with low-fat (lean) proteins. Low-fat proteins include fish, chicken without skin, eggs, beans, and tofu. Avoid fatty meat, cured and processed meat, or chicken with skin. Avoid pre-made or processed food. Eat less than 1,500 mg of salt each day. Do not drink alcohol if: Your doctor tells you not to drink. You are pregnant, may be pregnant, or are planning to become pregnant. If you drink alcohol: Limit how much you use to: 0-1 drink a day for women. 0-2 drinks a day for men. Be aware of how much alcohol is in your drink. In the [place] one drink equals one 12 oz bottle of beer (355 mL), one 5 oz glass of wine (148 mL), or one 1 oz glass of hard liquor (44 mL). Lifestyle  Work with your doctor to stay at a healthy weight or to lose weight. Ask your doctor what the best weight is for you. Get at least 30 minutes of exercise most days of the week. This may include walking, swimming, or biking. Get at least 30 minutes of exercise that strengthens your muscles (resistance exercise) at least 3 days a week. This may include lifting weights or doing Pilates. Do not use any products that contain nicotine or tobacco, such as cigarettes, e-cigarettes, and chewing tobacco. If you need help quitting, ask your doctor. Check your blood pressure at home as told by your doctor. Keep all follow-up visits as told by your doctor. This is  important. Medicines Take over-the-counter and prescription medicines only as told by your doctor. Follow directions carefully. Do not skip doses of blood pressure medicine. The medicine does not work as well if you skip doses. Skipping doses also puts you at risk for problems. Ask your doctor about side effects or reactions to medicines that you should watch for. Contact a doctor if you: Think you are having a reaction to the medicine you are taking. Have headaches that keep coming back (recurring). Feel dizzy. Have swelling in your ankles. Have trouble with your vision. Get help right away if you: Get a very bad headache. Start to feel mixed up (confused). Feel weak or numb. Feel faint. Have very bad pain in your: Chest. Belly (abdomen). Throw up more than once. Have trouble breathing. Summary Hypertension is another name for high blood pressure. High blood pressure forces your heart to work harder to pump blood. For most people, a normal blood pressure is less than 120/80. Making healthy choices can help lower blood pressure. If your blood pressure does not get lower with healthy choices, you may need to take medicine. This information is not intended to replace advice given to you by your health care provider. Make sure you discuss any questions you have with  your health care provider. Document Revised: 09/26/2017 Document Reviewed: 09/26/2017 Elsevier Patient Education  [place].

## 2020-11-16 NOTE — Assessment & Plan Note (Addendum)
Patient reports hand pain in the past few days. Symptoms have resolved. Educated patient to keep hand elevated if symptoms return, Voltaren gel as needed, ice or warm compress as tolerated, follow up with unresolved symptoms.
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
## 2020-11-22 ENCOUNTER — Telehealth: Payer: Self-pay | Admitting: Family Medicine

## 2020-11-22 NOTE — Telephone Encounter (Signed)
LVM for pt to rtn my call to schedule AWV with [facility].  

## 2020-12-05 ENCOUNTER — Other Ambulatory Visit: Payer: Self-pay | Admitting: Urology

## 2021-01-17 ENCOUNTER — Ambulatory Visit (INDEPENDENT_AMBULATORY_CARE_PROVIDER_SITE_OTHER): Payer: Medicare Other | Admitting: Family Medicine

## 2021-01-19 NOTE — Progress Notes (Signed)
Erroneous visit, wife was seen in his place

## 2021-02-02 ENCOUNTER — Ambulatory Visit: Payer: Medicare Other | Admitting: Urology

## 2021-02-23 ENCOUNTER — Encounter: Payer: Self-pay | Admitting: Family Medicine

## 2021-02-23 ENCOUNTER — Ambulatory Visit (INDEPENDENT_AMBULATORY_CARE_PROVIDER_SITE_OTHER): Payer: Medicare Other | Admitting: Family Medicine

## 2021-02-23 DIAGNOSIS — I10 Essential (primary) hypertension: Secondary | ICD-10-CM

## 2021-02-23 MED ORDER — ATORVASTATIN CALCIUM 40 MG PO TABS: 40.0000 mg | ORAL_TABLET | Freq: Every day | ORAL | 3 refills | Status: DC

## 2021-02-23 MED ORDER — LISINOPRIL 40 MG PO TABS: 40.0000 mg | ORAL_TABLET | Freq: Every day | ORAL | 3 refills | Status: DC
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
## 2021-03-14 ENCOUNTER — Ambulatory Visit: Payer: Medicare Other | Admitting: Family Medicine

## 2021-04-13 ENCOUNTER — Other Ambulatory Visit: Payer: Self-pay | Admitting: Urology

## 2021-04-13 ENCOUNTER — Telehealth: Payer: Self-pay

## 2021-04-13 DIAGNOSIS — N400 Enlarged prostate without lower urinary tract symptoms: Secondary | ICD-10-CM

## 2021-04-13 NOTE — Telephone Encounter (Signed)
Patients son called and advised patient needing a refill on medication ? ? ? ?Medication: ?finasteride (PROSCAR) 5 MG tablet ? ?Pharmacy: ?[id number] [place], [place] - [address]?

## 2021-04-14 ENCOUNTER — Other Ambulatory Visit: Payer: Self-pay

## 2021-04-14 MED ORDER — FINASTERIDE 5 MG PO TABS: 5.0000 mg | ORAL_TABLET | Freq: Every day | ORAL | 3 refills | Status: DC

## 2021-04-14 NOTE — Telephone Encounter (Signed)
Patient's son calling back to check on status of refill. ? ?Pt is out and needing a refill asap.  Or enough to last until appt. ? ?Thanks, ?Helene Kelp ?

## 2021-04-14 NOTE — Telephone Encounter (Signed)
Refill submitted. 

## 2021-04-18 ENCOUNTER — Other Ambulatory Visit: Payer: Self-pay

## 2021-04-18 ENCOUNTER — Ambulatory Visit (INDEPENDENT_AMBULATORY_CARE_PROVIDER_SITE_OTHER): Payer: Medicare Other | Admitting: Urology

## 2021-04-18 ENCOUNTER — Encounter: Payer: Self-pay | Admitting: Urology

## 2021-04-18 VITALS — BP 197/82 | HR 71

## 2021-04-18 DIAGNOSIS — N3281 Overactive bladder: Secondary | ICD-10-CM

## 2021-04-18 DIAGNOSIS — R350 Frequency of micturition: Secondary | ICD-10-CM

## 2021-04-18 DIAGNOSIS — N400 Enlarged prostate without lower urinary tract symptoms: Secondary | ICD-10-CM

## 2021-04-18 LAB — URINALYSIS, ROUTINE W REFLEX MICROSCOPIC
Bilirubin, UA: NEGATIVE
Glucose, UA: NEGATIVE
Ketones, UA: NEGATIVE
Leukocytes,UA: NEGATIVE
Nitrite, UA: NEGATIVE
Protein,UA: NEGATIVE
Specific Gravity, UA: 1.01 (ref 1.005–1.030)
Urobilinogen, Ur: 1 mg/dL (ref 0.2–1.0)
pH, UA: 7 (ref 5.0–7.5)

## 2021-04-18 LAB — MICROSCOPIC EXAMINATION
Bacteria, UA: NONE SEEN
Epithelial Cells (non renal): NONE SEEN /hpf (ref 0–10)
Renal Epithel, UA: NONE SEEN /hpf
WBC, UA: NONE SEEN /hpf (ref 0–5)

## 2021-04-18 MED ORDER — ALFUZOSIN HCL ER 10 MG PO TB24: 10.0000 mg | ORAL_TABLET | Freq: Every day | ORAL | 3 refills | Status: DC

## 2021-04-18 MED ORDER — MYRBETRIQ 50 MG PO TB24: 50.0000 mg | ORAL_TABLET | Freq: Every day | ORAL | 3 refills | Status: DC

## 2021-04-18 MED ORDER — FINASTERIDE 5 MG PO TABS: 5.0000 mg | ORAL_TABLET | Freq: Every day | ORAL | 3 refills | Status: DC

## 2021-04-18 NOTE — Patient Instructions (Signed)
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
## 2021-04-19 ENCOUNTER — Telehealth: Payer: Self-pay | Admitting: Family Medicine

## 2021-04-19 NOTE — Telephone Encounter (Signed)
Spoke with patient's daughter.  Patient's blood pressure was elevated while at another provider's office yesterday and they are concerned but refuse to see anyone else other than Dr. Warrick Parisian even though he is out of the office today.  I explained the risks to daughter and scheduled an appointment tomorrow afternoon with Dr. Warrick Parisian at 2:40 pm. ? ?

## 2021-04-20 ENCOUNTER — Encounter: Payer: Self-pay | Admitting: Family Medicine

## 2021-04-20 ENCOUNTER — Ambulatory Visit (INDEPENDENT_AMBULATORY_CARE_PROVIDER_SITE_OTHER): Payer: Medicare Other | Admitting: Family Medicine

## 2021-04-20 VITALS — BP 194/72 | HR 64 | Ht 64.0 in | Wt 125.0 lb

## 2021-04-20 DIAGNOSIS — I10 Essential (primary) hypertension: Secondary | ICD-10-CM | POA: Diagnosis not present

## 2021-04-20 DIAGNOSIS — R7303 Prediabetes: Secondary | ICD-10-CM

## 2021-04-20 DIAGNOSIS — E785 Hyperlipidemia, unspecified: Secondary | ICD-10-CM | POA: Diagnosis not present

## 2021-04-20 MED ORDER — AMLODIPINE BESYLATE 5 MG PO TABS: 5.0000 mg | ORAL_TABLET | Freq: Every day | ORAL | 1 refills | Status: DC

## 2021-04-20 NOTE — Progress Notes (Signed)
? ?BP (!) 194/72   Pulse 64   Ht 5' 4"  (1.626 m)   Wt 125 lb (56.7 kg)   SpO2 99%   BMI 21.46 kg/m?   ? ?Subjective:  ? ?Patient ID: Darren Rose, Darren Rose    DOB: 06/19/1931, 88 y.o.   MRN: [id number] ? ?HPI: ?Darren Rose is a 88 y.o. Darren Rose presenting on 04/20/2021 for Hypertension ? ? ?HPI ?Prediabetes  ?patient comes in today for recheck of his diabetes. Patient has been currently taking no medicine currently, has been diet controlled, does not want to be A1c today, wants to save it for next time.. Patient is currently on an ACE inhibitor/ARB. Patient has not seen an ophthalmologist this year. Patient denies any issues with their feet. The symptom started onset as an adult hypertension and hyperlipidemia ARE RELATED TO DM  ? ?Hypertension ?Patient is currently on lisinopril, and their blood pressure today is 194/72 and still running up at home.  He had stopped the hydrochlorothiazide because he was running too low and lightheaded and dizzy but has been running up since then. Patient denies any lightheadedness or dizziness. Patient denies blurred vision, chest pains, shortness of breath, or weakness. Denies any side effects from medication and is content with current medication.  ? ?Hyperlipidemia ?Patient is coming in for recheck of his hyperlipidemia. The patient is currently taking atorvastatin. They deny any issues with myalgias or history of liver damage from it. They deny any focal numbness or weakness or chest pain.  ? ?Relevant past medical, surgical, family and social history reviewed and updated as indicated. Interim medical history since our last visit reviewed. ?Allergies and medications reviewed and updated. ? ?Review of Systems  ?Constitutional:  Negative for chills and fever.  ?Eyes:  Negative for visual disturbance.  ?Respiratory:  Negative for shortness of breath and wheezing.   ?Cardiovascular:  Negative for chest pain and leg swelling.  ?Musculoskeletal:  Negative for back pain and gait  problem.  ?Skin:  Negative for rash.  ?Neurological:  Positive for headaches (Occasional rare headaches but not associated with blood pressure). Negative for dizziness, weakness and light-headedness.  ?All other systems reviewed and are negative. ? ?Per HPI unless specifically indicated above ? ? ?Allergies as of 04/20/2021   ?No Known Allergies ?  ? ?  ?Medication List  ?  ? ?  ? Accurate as of April 20, 2021  3:34 PM. If you have any questions, ask your nurse or doctor.  ?  ?  ? ?  ? ?alfuzosin 10 MG 24 hr tablet ?Commonly known as: UROXATRAL ?Take 1 tablet (10 mg total) by mouth daily. ?  ?amLODipine 5 MG tablet ?Commonly known as: NORVASC ?Take 1 tablet (5 mg total) by mouth daily. ?Started by: Worthy Rancher, MD ?  ?aspirin EC 81 MG tablet ?Take 81 mg by mouth daily. ?  ?atorvastatin 40 MG tablet ?Commonly known as: LIPITOR ?Take 1 tablet (40 mg total) by mouth daily at 6 PM. ?  ?diclofenac sodium 1 % Gel ?Commonly known as: VOLTAREN ?APPLY 1 SMALL AMOUNT TO SKIN EVERY TWELVE HOURS ?  ?finasteride 5 MG tablet ?Commonly known as: PROSCAR ?Take 1 tablet (5 mg total) by mouth daily. ?  ?ketoconazole 2 % cream ?Commonly known as: NIZORAL ?Apply topically. ?  ?lisinopril 40 MG tablet ?Commonly known as: ZESTRIL ?Take 1 tablet (40 mg total) by mouth daily. ?  ?Myrbetriq 50 MG Tb24 tablet ?Generic drug: mirabegron ER ?Take 1 tablet (50 mg total) by mouth  daily. ?  ? ?  ? ? ? ?Objective:  ? ?BP (!) 194/72   Pulse 64   Ht 5' 4"  (1.626 m)   Wt 125 lb (56.7 kg)   SpO2 99%   BMI 21.46 kg/m?   ?Wt Readings from Last 3 Encounters:  ?04/20/21 125 lb (56.7 kg)  ?02/23/21 126 lb (57.2 kg)  ?11/16/20 131 lb (59.4 kg)  ?  ?Physical Exam ?Vitals and nursing note reviewed.  ?Constitutional:   ?   General: He is not in acute distress. ?   Appearance: He is well-developed. He is not diaphoretic.  ?Eyes:  ?   General: No scleral icterus. ?   Conjunctiva/sclera: Conjunctivae normal.  ?Neck:  ?   Thyroid: No thyromegaly.   ?Cardiovascular:  ?   Rate and Rhythm: Normal rate and regular rhythm.  ?   Heart sounds: Normal heart sounds. No murmur heard. ?Pulmonary:  ?   Effort: Pulmonary effort is normal. No respiratory distress.  ?   Breath sounds: Normal breath sounds. No wheezing.  ?Musculoskeletal:     ?   General: Normal range of motion.  ?   Cervical back: Neck supple.  ?Lymphadenopathy:  ?   Cervical: No cervical adenopathy.  ?Skin: ?   General: Skin is warm and dry.  ?   Findings: No rash.  ?Neurological:  ?   Mental Status: He is alert and oriented to person, place, and time.  ?   Coordination: Coordination normal.  ?Psychiatric:     ?   Behavior: Behavior normal.  ? ? ? ? ?Assessment & Plan:  ? ?Problem List Items Addressed This Visit   ? ?  ? Cardiovascular and Mediastinum  ? Essential hypertension, benign - Primary  ? Relevant Medications  ? amLODipine (NORVASC) 5 MG tablet  ? Other Relevant Orders  ? Bayer DCA Hb A1c Waived  ? CBC with Differential/Platelet  ?  ? Other  ? Hyperlipidemia with target LDL less than 100  ? Relevant Medications  ? amLODipine (NORVASC) 5 MG tablet  ? Other Relevant Orders  ? CMP14+EGFR  ? Prediabetes  ? Relevant Orders  ? Bayer DCA Hb A1c Waived  ?  ?We will add amlodipine for blood pressure and see how he does, he wants to hold off on the blood work until next time. ?Follow up plan: ?Return if symptoms worsen or fail to improve, for 2 to 3-week hypertension . ? ?Counseling provided for all of the vaccine components ?Orders Placed This Encounter  ?Procedures  ? Bayer DCA Hb A1c Waived  ? CBC with Differential/Platelet  ? CMP14+EGFR  ? ? ?Caryl Pina, MD ?[facility] ?04/20/2021, 3:34 PM ? ? ? ? ?

## 2021-05-19 ENCOUNTER — Ambulatory Visit (INDEPENDENT_AMBULATORY_CARE_PROVIDER_SITE_OTHER): Payer: Medicare Other | Admitting: Family Medicine

## 2021-05-19 ENCOUNTER — Encounter: Payer: Self-pay | Admitting: Family Medicine

## 2021-05-19 VITALS — BP 115/77 | HR 87 | Ht 64.0 in | Wt 125.0 lb

## 2021-05-19 DIAGNOSIS — J301 Allergic rhinitis due to pollen: Secondary | ICD-10-CM

## 2021-05-19 DIAGNOSIS — I10 Essential (primary) hypertension: Secondary | ICD-10-CM

## 2021-05-19 DIAGNOSIS — R7303 Prediabetes: Secondary | ICD-10-CM

## 2021-05-19 DIAGNOSIS — E785 Hyperlipidemia, unspecified: Secondary | ICD-10-CM

## 2021-05-19 MED ORDER — LORATADINE 10 MG PO TABS: 10.0000 mg | ORAL_TABLET | Freq: Every day | ORAL | 11 refills | Status: DC

## 2021-05-19 NOTE — Progress Notes (Signed)
? ?BP 115/77   Pulse 87   Ht _0  (1.626 m)   Wt 125 lb (56.7 kg)   SpO2 94%   BMI 21.46 kg/m?   ? ?Subjective:  ? ?Patient ID: Darren Rose, male    DOB: Aug 29, 1931, 88 y.o.   MRN: [id number] ? ?HPI: ?Darren Rose is a 88 y.o. male presenting on 05/19/2021 for Medical Management of Chronic Issues and Hypertension ? ? ?HPI ?Prediabetes  ?patient comes in today for recheck of his diabetes. Patient has been currently taking no medicine and is diet controlled. Patient is currently on an ACE inhibitor/ARB. Patient has not seen an ophthalmologist this year. Patient denies any issues with their feet. The symptom started onset as an adult hypertension and hyperlipidemia ARE RELATED TO DM  ? ?Hypertension ?Patient is currently on lisinopril and amlodipine, and their blood pressure today is 115/77. Patient denies any lightheadedness or dizziness. Patient denies headaches, blurred vision, chest pains, shortness of breath, or weakness. Denies any side effects from medication and is content with current medication.  ? ?Hyperlipidemia ?Patient is coming in for recheck of his hyperlipidemia. The patient is currently taking atorvastatin. They deny any issues with myalgias or history of liver damage from it. They deny any focal numbness or weakness or chest pain.  ? ?He is having a little bit of allergies a little bit of cough with it and wants to know if he can take something for it. ? ?Relevant past medical, surgical, family and social history reviewed and updated as indicated. Interim medical history since our last visit reviewed. ?Allergies and medications reviewed and updated. ? ?Review of Systems  ?Constitutional:  Negative for chills and fever.  ?Eyes:  Negative for visual disturbance.  ?Respiratory:  Negative for shortness of breath and wheezing.   ?Cardiovascular:  Negative for chest pain and leg swelling.  ?Musculoskeletal:  Negative for back pain and gait problem.  ?Skin:  Negative for rash.  ?Neurological:   Negative for dizziness and numbness.  ?All other systems reviewed and are negative. ? ?Per HPI unless specifically indicated above ? ? ?Allergies as of 05/19/2021   ?No Known Allergies ?  ? ?  ?Medication List  ?  ? ?  ? Accurate as of May 19, 2021  4:32 PM. If you have any questions, ask your nurse or doctor.  ?  ?  ? ?  ? ?alfuzosin 10 MG 24 hr tablet ?Commonly known as: UROXATRAL ?Take 1 tablet (10 mg total) by mouth daily. ?  ?amLODipine 5 MG tablet ?Commonly known as: NORVASC ?Take 1 tablet (5 mg total) by mouth daily. ?  ?aspirin EC 81 MG tablet ?Take 81 mg by mouth daily. ?  ?atorvastatin 40 MG tablet ?Commonly known as: LIPITOR ?Take 1 tablet (40 mg total) by mouth daily at 6 PM. ?  ?diclofenac sodium 1 % Gel ?Commonly known as: VOLTAREN ?APPLY 1 SMALL AMOUNT TO SKIN EVERY TWELVE HOURS ?  ?finasteride 5 MG tablet ?Commonly known as: PROSCAR ?Take 1 tablet (5 mg total) by mouth daily. ?  ?ketoconazole 2 % cream ?Commonly known as: NIZORAL ?Apply topically. ?  ?lisinopril 40 MG tablet ?Commonly known as: ZESTRIL ?Take 1 tablet (40 mg total) by mouth daily. ?  ?loratadine 10 MG tablet ?Commonly known as: CLARITIN ?Take 1 tablet (10 mg total) by mouth daily. ?Started by: Worthy Rancher, MD ?  ?Myrbetriq 50 MG Tb24 tablet ?Generic drug: mirabegron ER ?Take 1 tablet (50 mg total) by mouth daily. ?  ? ?  ? ? ? ?  Objective:  ? ?BP 115/77   Pulse 87   Ht _0  (1.626 m)   Wt 125 lb (56.7 kg)   SpO2 94%   BMI 21.46 kg/m?   ?Wt Readings from Last 3 Encounters:  ?05/19/21 125 lb (56.7 kg)  ?04/20/21 125 lb (56.7 kg)  ?02/23/21 126 lb (57.2 kg)  ?  ?Physical Exam ?Vitals and nursing note reviewed.  ?Constitutional:   ?   General: He is not in acute distress. ?   Appearance: He is well-developed. He is not diaphoretic.  ?Eyes:  ?   General: No scleral icterus. ?   Conjunctiva/sclera: Conjunctivae normal.  ?Neck:  ?   Thyroid: No thyromegaly.  ?Cardiovascular:  ?   Rate and Rhythm: Normal rate and regular rhythm.   ?   Heart sounds: Normal heart sounds. No murmur heard. ?Pulmonary:  ?   Effort: Pulmonary effort is normal. No respiratory distress.  ?   Breath sounds: Normal breath sounds. No wheezing.  ?Musculoskeletal:     ?   General: No swelling. Normal range of motion.  ?   Cervical back: Neck supple.  ?Lymphadenopathy:  ?   Cervical: No cervical adenopathy.  ?Skin: ?   General: Skin is warm and dry.  ?   Findings: No rash.  ?Neurological:  ?   Mental Status: He is alert and oriented to person, place, and time.  ?   Coordination: Coordination normal.  ?Psychiatric:     ?   Behavior: Behavior normal.  ? ? ? ? ?Assessment & Plan:  ? ?Problem List Items Addressed This Visit   ? ?  ? Cardiovascular and Mediastinum  ? Essential hypertension, benign - Primary  ? Relevant Orders  ? CBC with Differential/Platelet  ? CMP14+EGFR  ?  ? Other  ? Hyperlipidemia with target LDL less than 100  ? Relevant Orders  ? Lipid panel  ? Prediabetes  ? Relevant Orders  ? Bayer DCA Hb A1c Waived  ? ?Other Visit Diagnoses   ? ? Seasonal allergic rhinitis due to pollen      ? Relevant Medications  ? loratadine (CLARITIN) 10 MG tablet  ? Other Relevant Orders  ? CBC with Differential/Platelet  ? ?  ?  ?Blood pressure looks good, continue current medicine, will do blood work. ? ?No changes.  Follow-up in 6 months.  Blood pressure looks good today ?Follow up plan: ?Return in about 6 months (around 11/18/2021), or if symptoms worsen or fail to improve, for Prediabetes and hypertension and cholesterol. ? ?Counseling provided for all of the vaccine components ?Orders Placed This Encounter  ?Procedures  ? Bayer DCA Hb A1c Waived  ? CBC with Differential/Platelet  ? CMP14+EGFR  ? Lipid panel  ? ? ?Caryl Pina, MD ?[place] ?05/19/2021, 4:32 PM ? ? ? ? ?

## 2021-05-20 LAB — CBC WITH DIFFERENTIAL/PLATELET
Basophils Absolute: 0.1 10*3/uL (ref 0.0–0.2)
Basos: 1 %
EOS (ABSOLUTE): 0.5 10*3/uL — ABNORMAL HIGH (ref 0.0–0.4)
Eos: 7 %
Hematocrit: 35.9 % — ABNORMAL LOW (ref 37.5–51.0)
Hemoglobin: 12.1 g/dL — ABNORMAL LOW (ref 13.0–17.7)
Immature Grans (Abs): 0 10*3/uL (ref 0.0–0.1)
Immature Granulocytes: 0 %
Lymphocytes Absolute: 1.8 10*3/uL (ref 0.7–3.1)
Lymphs: 27 %
MCH: 30.6 pg (ref 26.6–33.0)
MCHC: 33.7 g/dL (ref 31.5–35.7)
MCV: 91 fL (ref 79–97)
Monocytes Absolute: 0.7 10*3/uL (ref 0.1–0.9)
Monocytes: 10 %
Neutrophils Absolute: 3.7 10*3/uL (ref 1.4–7.0)
Neutrophils: 55 %
Platelets: 201 10*3/uL (ref 150–450)
RBC: 3.95 x10E6/uL — ABNORMAL LOW (ref 4.14–5.80)
RDW: 14 % (ref 11.6–15.4)
WBC: 6.8 10*3/uL (ref 3.4–10.8)

## 2021-05-20 LAB — CMP14+EGFR
ALT: 17 IU/L (ref 0–44)
AST: 24 IU/L (ref 0–40)
Albumin/Globulin Ratio: 1.6 (ref 1.2–2.2)
Albumin: 3.9 g/dL (ref 3.6–4.6)
Alkaline Phosphatase: 86 IU/L (ref 44–121)
BUN/Creatinine Ratio: 17 (ref 10–24)
BUN: 14 mg/dL (ref 8–27)
Bilirubin Total: 0.3 mg/dL (ref 0.0–1.2)
CO2: 26 mmol/L (ref 20–29)
Calcium: 8.9 mg/dL (ref 8.6–10.2)
Chloride: 104 mmol/L (ref 96–106)
Creatinine, Ser: 0.82 mg/dL (ref 0.76–1.27)
Globulin, Total: 2.4 g/dL (ref 1.5–4.5)
Glucose: 104 mg/dL — ABNORMAL HIGH (ref 70–99)
Potassium: 4.6 mmol/L (ref 3.5–5.2)
Sodium: 141 mmol/L (ref 134–144)
Total Protein: 6.3 g/dL (ref 6.0–8.5)
eGFR: 84 mL/min/{1.73_m2} (ref >59)

## 2021-05-20 LAB — BAYER DCA HB A1C WAIVED: HB A1C (BAYER DCA - WAIVED): 5.1 % (ref 4.8–5.6)

## 2021-05-20 LAB — LIPID PANEL
Chol/HDL Ratio: 2.5 ratio (ref 0.0–5.0)
Cholesterol, Total: 104 mg/dL (ref 100–199)
HDL: 41 mg/dL (ref >39)
LDL Chol Calc (NIH): 49 mg/dL (ref 0–99)
Triglycerides: 63 mg/dL (ref 0–149)
VLDL Cholesterol Cal: 14 mg/dL (ref 5–40)

## 2021-07-08 ENCOUNTER — Other Ambulatory Visit: Payer: Self-pay | Admitting: Family Medicine

## 2021-07-08 DIAGNOSIS — I10 Essential (primary) hypertension: Secondary | ICD-10-CM

## 2021-08-30 ENCOUNTER — Other Ambulatory Visit: Payer: Self-pay | Admitting: *Deleted

## 2021-08-30 NOTE — Patient Outreach (Signed)
  Care Management   Outreach Note  08/30/2021  Name: Darren Rose MRN: [id number] DOB: 05-19-1931  Reason for Referral: Care Coordination - Assessment of Needs.   An unsuccessful telephone outreach was attempted today. The patient was referred to the case management team for assistance with care management and care coordination. A HIPAA compliant message was left on voicemail for patient, providing contact information for CSW, encouraging patient to return the call at his earliest convenience.   Follow Up Plan:  Sharon will reschedule initial telephone outreach call for patient with CSW.   Nat Christen, BSW, MSW, LCSW  Licensed Education officer, [facility]  Mailing [place] [address] [place], [place] [address] Physical Address[address] [place], [place] [address] Toll Free Main # [phone] Fax # [phone] Cell # [phone] [email or web address]

## 2021-09-06 ENCOUNTER — Other Ambulatory Visit: Payer: Self-pay | Admitting: *Deleted

## 2021-09-06 NOTE — Patient Outreach (Signed)
  Care Coordination   09/06/2021  Name: Avyukth Bontempo MRN: [id number] DOB: 1931-06-30   Care Coordination Outreach Attempts:  A second unsuccessful outreach was attempted today to offer the patient with information about available care coordination services as a benefit of their health plan.   HIPAA compliant messages left on voicemail, providing contact information for CSW, encouraging patient to return the call at their earliest convenience.  Follow Up Plan:  Additional outreach attempts will be made to offer the patient care coordination information and services.   Encounter Outcome:  No Answer.   Care Coordination Interventions Activated:  No    Care Coordination Interventions:  No, not indicated.    Nat Christen, BSW, MSW, LCSW  Licensed Education officer, [facility]  Mailing [place] [address], [place], [place] [address] Physical Address[address], [place], [place] [address] Toll Free Main # [phone] Fax # [phone] Cell # [phone] [email or web address]

## 2021-09-13 ENCOUNTER — Other Ambulatory Visit: Payer: Self-pay | Admitting: *Deleted

## 2021-09-13 NOTE — Patient Outreach (Signed)
  Care Coordination   09/13/2021  Name: Darren Rose MRN: [id number] DOB: [birth year]   Care Coordination Outreach Attempts:  A third unsuccessful outreach was attempted today to offer the patient with information about available care coordination services as a benefit of their health plan. HIPAA compliant messages left on voicemail, providing contact information for CSW, encouraging patient to return CSW's call at his earliest convenience.  Follow Up Plan:  No further outreach attempts will be made at this time. We have been unable to contact the patient to offer or enroll patient in care coordination services.  Encounter Outcome:  No Answer.   Care Coordination Interventions Activated:  No.    Care Coordination Interventions:  No, not indicated.    Nat Christen, BSW, MSW, LCSW  Licensed Education officer, [facility]  Mailing [place] [address] [place], [place] [address] Physical Address-[address] [place], [place] [address] Toll Free Main # [phone] Fax # [phone] Cell # [phone] [email or web address]

## 2021-09-30 DIAGNOSIS — H905 Unspecified sensorineural hearing loss: Secondary | ICD-10-CM | POA: Diagnosis not present

## 2021-10-13 ENCOUNTER — Other Ambulatory Visit: Payer: Self-pay | Admitting: Family Medicine

## 2021-10-13 DIAGNOSIS — I10 Essential (primary) hypertension: Secondary | ICD-10-CM

## 2021-11-23 ENCOUNTER — Encounter: Payer: Self-pay | Admitting: Family Medicine

## 2021-11-23 ENCOUNTER — Ambulatory Visit (INDEPENDENT_AMBULATORY_CARE_PROVIDER_SITE_OTHER): Payer: Medicare Other | Admitting: Family Medicine

## 2021-11-23 VITALS — BP 126/72 | HR 53 | Temp 97.0°F | Ht 64.0 in | Wt 127.0 lb

## 2021-11-23 DIAGNOSIS — Z23 Encounter for immunization: Secondary | ICD-10-CM

## 2021-11-23 DIAGNOSIS — Z125 Encounter for screening for malignant neoplasm of prostate: Secondary | ICD-10-CM | POA: Diagnosis not present

## 2021-11-23 DIAGNOSIS — I10 Essential (primary) hypertension: Secondary | ICD-10-CM | POA: Diagnosis not present

## 2021-11-23 DIAGNOSIS — R7303 Prediabetes: Secondary | ICD-10-CM | POA: Diagnosis not present

## 2021-11-23 DIAGNOSIS — E785 Hyperlipidemia, unspecified: Secondary | ICD-10-CM | POA: Diagnosis not present

## 2021-11-23 LAB — BAYER DCA HB A1C WAIVED: HB A1C (BAYER DCA - WAIVED): 5.4 % (ref 4.8–5.6)

## 2021-11-23 MED ORDER — AMLODIPINE BESYLATE 5 MG PO TABS: 5.0000 mg | ORAL_TABLET | Freq: Every day | ORAL | 3 refills | Status: DC

## 2021-11-23 NOTE — Progress Notes (Signed)
BP 126/72   Pulse (!) 53   Temp (!) 97 F (36.1 C)   Ht 5' 4"  (1.626 m)   Wt 127 lb (57.6 kg)   SpO2 99%   BMI 21.80 kg/m    Subjective:   Patient ID: Darren Rose, male    DOB: 1931/04/20, 88 y.o.   MRN: [id number]  HPI: Darren Rose is a 89 y.o. male presenting on 11/23/2021 for Medical Management of Chronic Issues, Hypertension, Hyperlipidemia, and Prediabetes   HPI Prediabetes Patient comes in today for recheck of his diabetes. Patient has been currently taking no medication currently, has been diet controlled. Patient is currently on an ACE inhibitor/ARB. Patient has seen an ophthalmologist this year. Patient denies any issues with their feet. The symptom started onset as an adult hypertension and hyperlipidemia ARE RELATED TO DM   Hypertension Patient is currently on amlodipine and lisinopril, and their blood pressure today is 126/72. Patient denies any lightheadedness or dizziness. Patient denies headaches, blurred vision, chest pains, shortness of breath, or weakness. Denies any side effects from medication and is content with current medication.   Hyperlipidemia Patient is coming in for recheck of his hyperlipidemia. The patient is currently taking atorvastatin. They deny any issues with myalgias or history of liver damage from it. They deny any focal numbness or weakness or chest pain.   Relevant past medical, surgical, family and social history reviewed and updated as indicated. Interim medical history since our last visit reviewed. Allergies and medications reviewed and updated.  Review of Systems  Constitutional:  Negative for chills and fever.  Eyes:  Negative for visual disturbance.  Respiratory:  Negative for shortness of breath and wheezing.   Cardiovascular:  Negative for chest pain and leg swelling.  Musculoskeletal:  Negative for back pain and gait problem.  Skin:  Negative for rash.  Neurological:  Negative for dizziness, weakness and  light-headedness.  All other systems reviewed and are negative.   Per HPI unless specifically indicated above   Allergies as of 11/23/2021   No Known Allergies      Medication List        Accurate as of November 23, 2021 10:43 AM. If you have any questions, ask your nurse or doctor.          alfuzosin 10 MG 24 hr tablet Commonly known as: UROXATRAL Take 1 tablet (10 mg total) by mouth daily.   amLODipine 5 MG tablet Commonly known as: NORVASC Take 1 tablet (5 mg total) by mouth daily.   aspirin EC 81 MG tablet Take 81 mg by mouth daily.   atorvastatin 40 MG tablet Commonly known as: LIPITOR Take 1 tablet (40 mg total) by mouth daily at 6 PM.   diclofenac sodium 1 % Gel Commonly known as: VOLTAREN APPLY 1 SMALL AMOUNT TO SKIN EVERY TWELVE HOURS   finasteride 5 MG tablet Commonly known as: PROSCAR Take 1 tablet (5 mg total) by mouth daily.   ketoconazole 2 % cream Commonly known as: NIZORAL Apply topically.   lisinopril 40 MG tablet Commonly known as: ZESTRIL Take 1 tablet (40 mg total) by mouth daily.   loratadine 10 MG tablet Commonly known as: CLARITIN Take 1 tablet (10 mg total) by mouth daily.   Myrbetriq 50 MG Tb24 tablet Generic drug: mirabegron ER Take 1 tablet (50 mg total) by mouth daily.         Objective:   BP 126/72   Pulse (!) 53   Temp (!) 97 F (  36.1 C)   Ht 5' 4"  (1.626 m)   Wt 127 lb (57.6 kg)   SpO2 99%   BMI 21.80 kg/m   Wt Readings from Last 3 Encounters:  11/23/21 127 lb (57.6 kg)  05/19/21 125 lb (56.7 kg)  04/20/21 125 lb (56.7 kg)    Physical Exam Vitals and nursing note reviewed.  Constitutional:      General: He is not in acute distress.    Appearance: He is well-developed. He is not diaphoretic.  Eyes:     General: No scleral icterus.    Conjunctiva/sclera: Conjunctivae normal.  Neck:     Thyroid: No thyromegaly.  Cardiovascular:     Rate and Rhythm: Normal rate and regular rhythm.     Heart sounds:  Normal heart sounds. No murmur heard. Pulmonary:     Effort: Pulmonary effort is normal. No respiratory distress.     Breath sounds: Normal breath sounds. No wheezing.  Musculoskeletal:        General: No swelling. Normal range of motion.     Cervical back: Neck supple.  Lymphadenopathy:     Cervical: No cervical adenopathy.  Skin:    General: Skin is warm and dry.     Findings: No rash.  Neurological:     Mental Status: He is alert and oriented to person, place, and time.     Coordination: Coordination normal.  Psychiatric:        Behavior: Behavior normal.       Assessment & Plan:   Problem List Items Addressed This Visit       Cardiovascular and Mediastinum   Essential hypertension, benign - Primary   Relevant Medications   amLODipine (NORVASC) 5 MG tablet   Other Relevant Orders   CBC with Differential/Platelet   CMP14+EGFR   Lipid panel   Bayer DCA Hb A1c Waived     Other   Hyperlipidemia with target LDL less than 100   Relevant Medications   amLODipine (NORVASC) 5 MG tablet   Other Relevant Orders   CBC with Differential/Platelet   CMP14+EGFR   Lipid panel   Bayer DCA Hb A1c Waived   Prediabetes   Relevant Orders   CBC with Differential/Platelet   CMP14+EGFR   Lipid panel   Bayer DCA Hb A1c Waived   Other Visit Diagnoses     Prostate cancer screening       Relevant Orders   PSA, total and free       A1c looks good at 5.4, everything looks good, no changes.  Continue current medicine.  Blood pressure is good as well. Follow up plan: Return in about 6 months (around 05/25/2022), or if symptoms worsen or fail to improve, for Hypertension and hyperlipidemia and prediabetes.  Counseling provided for all of the vaccine components Orders Placed This Encounter  Procedures   CBC with Differential/Platelet   CMP14+EGFR   Lipid panel   Bayer DCA Hb A1c Waived   PSA, total and free    Caryl Pina, MD [facility] 11/23/2021, 10:43 AM

## 2021-11-24 LAB — CBC WITH DIFFERENTIAL/PLATELET
Basophils Absolute: 0.1 10*3/uL (ref 0.0–0.2)
Basos: 1 %
EOS (ABSOLUTE): 0.5 10*3/uL — ABNORMAL HIGH (ref 0.0–0.4)
Eos: 7 %
Hematocrit: 35.6 % — ABNORMAL LOW (ref 37.5–51.0)
Hemoglobin: 12 g/dL — ABNORMAL LOW (ref 13.0–17.7)
Immature Grans (Abs): 0 10*3/uL (ref 0.0–0.1)
Immature Granulocytes: 0 %
Lymphocytes Absolute: 2 10*3/uL (ref 0.7–3.1)
Lymphs: 30 %
MCH: 30.8 pg (ref 26.6–33.0)
MCHC: 33.7 g/dL (ref 31.5–35.7)
MCV: 92 fL (ref 79–97)
Monocytes Absolute: 0.6 10*3/uL (ref 0.1–0.9)
Monocytes: 9 %
Neutrophils Absolute: 3.5 10*3/uL (ref 1.4–7.0)
Neutrophils: 53 %
Platelets: 194 10*3/uL (ref 150–450)
RBC: 3.89 x10E6/uL — ABNORMAL LOW (ref 4.14–5.80)
RDW: 12.7 % (ref 11.6–15.4)
WBC: 6.7 10*3/uL (ref 3.4–10.8)

## 2021-11-24 LAB — CMP14+EGFR
ALT: 13 IU/L (ref 0–44)
AST: 22 IU/L (ref 0–40)
Albumin/Globulin Ratio: 1.5 (ref 1.2–2.2)
Albumin: 3.5 g/dL — ABNORMAL LOW (ref 3.7–4.7)
Alkaline Phosphatase: 98 IU/L (ref 44–121)
BUN/Creatinine Ratio: 19 (ref 10–24)
BUN: 16 mg/dL (ref 8–27)
Bilirubin Total: 0.4 mg/dL (ref 0.0–1.2)
CO2: 26 mmol/L (ref 20–29)
Calcium: 9.2 mg/dL (ref 8.6–10.2)
Chloride: 101 mmol/L (ref 96–106)
Creatinine, Ser: 0.84 mg/dL (ref 0.76–1.27)
Globulin, Total: 2.4 g/dL (ref 1.5–4.5)
Glucose: 95 mg/dL (ref 70–99)
Potassium: 3.5 mmol/L (ref 3.5–5.2)
Sodium: 139 mmol/L (ref 134–144)
Total Protein: 5.9 g/dL — ABNORMAL LOW (ref 6.0–8.5)
eGFR: 84 mL/min/{1.73_m2} (ref >59)

## 2021-11-24 LAB — PSA, TOTAL AND FREE
PSA, Free Pct: 25 %
PSA, Free: 0.1 ng/mL
Prostate Specific Ag, Serum: 0.4 ng/mL (ref 0.0–4.0)

## 2021-11-24 LAB — LIPID PANEL
Chol/HDL Ratio: 2.4 ratio (ref 0.0–5.0)
Cholesterol, Total: 92 mg/dL — ABNORMAL LOW (ref 100–199)
HDL: 39 mg/dL — ABNORMAL LOW (ref >39)
LDL Chol Calc (NIH): 40 mg/dL (ref 0–99)
Triglycerides: 55 mg/dL (ref 0–149)
VLDL Cholesterol Cal: 13 mg/dL (ref 5–40)

## 2021-12-12 DIAGNOSIS — D239 Other benign neoplasm of skin, unspecified: Secondary | ICD-10-CM | POA: Diagnosis not present

## 2021-12-12 DIAGNOSIS — D485 Neoplasm of uncertain behavior of skin: Secondary | ICD-10-CM | POA: Diagnosis not present

## 2021-12-12 DIAGNOSIS — L308 Other specified dermatitis: Secondary | ICD-10-CM | POA: Diagnosis not present

## 2021-12-23 DIAGNOSIS — L089 Local infection of the skin and subcutaneous tissue, unspecified: Secondary | ICD-10-CM | POA: Diagnosis not present

## 2021-12-23 DIAGNOSIS — L03113 Cellulitis of right upper limb: Secondary | ICD-10-CM | POA: Diagnosis not present

## 2021-12-23 DIAGNOSIS — B9689 Other specified bacterial agents as the cause of diseases classified elsewhere: Secondary | ICD-10-CM | POA: Diagnosis not present

## 2021-12-23 DIAGNOSIS — S63111A Subluxation of metacarpophalangeal joint of right thumb, initial encounter: Secondary | ICD-10-CM | POA: Diagnosis not present

## 2021-12-23 DIAGNOSIS — M79641 Pain in right hand: Secondary | ICD-10-CM | POA: Diagnosis not present

## 2021-12-28 ENCOUNTER — Encounter: Payer: Self-pay | Admitting: Nurse Practitioner

## 2021-12-28 ENCOUNTER — Ambulatory Visit (INDEPENDENT_AMBULATORY_CARE_PROVIDER_SITE_OTHER): Payer: Medicare Other | Admitting: Nurse Practitioner

## 2021-12-28 VITALS — BP 146/74 | HR 67 | Temp 98.8°F | Ht 64.0 in

## 2021-12-28 DIAGNOSIS — L03113 Cellulitis of right upper limb: Secondary | ICD-10-CM

## 2021-12-28 DIAGNOSIS — Z09 Encounter for follow-up examination after completed treatment for conditions other than malignant neoplasm: Secondary | ICD-10-CM

## 2021-12-28 NOTE — Patient Instructions (Signed)
Cellulitis, Adult  Cellulitis is a skin infection. The infected area is often warm, red, swollen, and sore. It occurs most often in the arms and lower legs. It is very important to get treated for this condition. What are the causes? This condition is caused by bacteria. The bacteria enter through a break in the skin, such as a cut, burn, insect bite, open sore, or crack. What increases the risk? This condition is more likely to occur in people who: Have a weak body defense system (immune system). Have open cuts, burns, bites, or scrapes on the skin. Are older than 86 years of age. Have a blood sugar problem (diabetes). Have a long-lasting (chronic) liver disease (cirrhosis) or kidney disease. Are very overweight (obese). Have a skin problem, such as: Itchy rash (eczema). Slow movement of blood in the veins (venous stasis). Fluid buildup below the skin (edema). Have been treated with high-energy rays (radiation). Use IV drugs. What are the signs or symptoms? Symptoms of this condition include: Skin that is: Red. Streaking. Spotting. Swollen. Sore or painful when you touch it. Warm. A fever. Chills. Blisters. How is this diagnosed? This condition is diagnosed based on: Medical history. Physical exam. Blood tests. Imaging tests. How is this treated? Treatment for this condition may include: Medicines to treat infections or allergies. Home care, such as: Rest. Placing cold or warm cloths (compresses) on the skin. Hospital care, if the condition is very bad. Follow these instructions at home: Medicines Take over-the-counter and prescription medicines only as told by your doctor. If you were prescribed an antibiotic medicine, take it as told by your doctor. Do not stop taking it even if you start to feel better. General instructions  Drink enough fluid to keep your pee (urine) pale yellow. Do not touch or rub the infected area. Raise (elevate) the infected area above  the level of your heart while you are sitting or lying down. Place cold or warm cloths on the area as told by your doctor. Keep all follow-up visits as told by your doctor. This is important. Contact a doctor if: You have a fever. You do not start to get better after 1-2 days of treatment. Your bone or joint under the infected area starts to hurt after the skin has healed. Your infection comes back. This can happen in the same area or another area. You have a swollen bump in the area. You have new symptoms. You feel ill and have muscle aches and pains. Get help right away if: Your symptoms get worse. You feel very sleepy. You throw up (vomit) or have watery poop (diarrhea) for a long time. You see red streaks coming from the area. Your red area gets larger. Your red area turns dark in color. These symptoms may represent a serious problem that is an emergency. Do not wait to see if the symptoms will go away. Get medical help right away. Call your local emergency services (911 in the [place] Do not drive yourself to the hospital. Summary Cellulitis is a skin infection. The area is often warm, red, swollen, and sore. This condition is treated with medicines, rest, and cold and warm cloths. Take all medicines only as told by your doctor. Tell your doctor if symptoms do not start to get better after 1-2 days of treatment. This information is not intended to replace advice given to you by your health care provider. Make sure you discuss any questions you have with your health care provider. Document Revised: 10/27/2020 Document   Reviewed: 10/28/2020 Elsevier Patient Education  2023 Elsevier Inc.  
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
## 2022-01-03 ENCOUNTER — Emergency Department (HOSPITAL_COMMUNITY)
Admission: EM | Admit: 2022-01-03 | Discharge: 2022-01-03 | Disposition: A | Discharge status: Home / Self Care | Payer: Medicare Other | Attending: Emergency Medicine | Admitting: Emergency Medicine

## 2022-01-03 ENCOUNTER — Emergency Department (HOSPITAL_COMMUNITY): Payer: Medicare Other

## 2022-01-03 DIAGNOSIS — L03113 Cellulitis of right upper limb: Secondary | ICD-10-CM | POA: Diagnosis not present

## 2022-01-03 DIAGNOSIS — Z7982 Long term (current) use of aspirin: Secondary | ICD-10-CM | POA: Diagnosis not present

## 2022-01-03 DIAGNOSIS — M7989 Other specified soft tissue disorders: Secondary | ICD-10-CM | POA: Diagnosis not present

## 2022-01-03 DIAGNOSIS — R2231 Localized swelling, mass and lump, right upper limb: Secondary | ICD-10-CM | POA: Diagnosis present

## 2022-01-03 LAB — CBC WITH DIFFERENTIAL/PLATELET
Abs Immature Granulocytes: 0.03 10*3/uL (ref 0.00–0.07)
Basophils Absolute: 0.1 10*3/uL (ref 0.0–0.1)
Basophils Relative: 1 %
Eosinophils Absolute: 0.2 10*3/uL (ref 0.0–0.5)
Eosinophils Relative: 2 %
HCT: 38.3 % — ABNORMAL LOW (ref 39.0–52.0)
Hemoglobin: 12.1 g/dL — ABNORMAL LOW (ref 13.0–17.0)
Immature Granulocytes: 0 %
Lymphocytes Relative: 16 %
Lymphs Abs: 1.6 10*3/uL (ref 0.7–4.0)
MCH: 29.7 pg (ref 26.0–34.0)
MCHC: 31.6 g/dL (ref 30.0–36.0)
MCV: 93.9 fL (ref 80.0–100.0)
Monocytes Absolute: 1.1 10*3/uL — ABNORMAL HIGH (ref 0.1–1.0)
Monocytes Relative: 11 %
Neutro Abs: 7.5 10*3/uL (ref 1.7–7.7)
Neutrophils Relative %: 70 %
Platelets: 223 10*3/uL (ref 150–400)
RBC: 4.08 MIL/uL — ABNORMAL LOW (ref 4.22–5.81)
RDW: 12.7 % (ref 11.5–15.5)
WBC: 10.5 10*3/uL (ref 4.0–10.5)
nRBC: 0 % (ref 0.0–0.2)

## 2022-01-03 LAB — BASIC METABOLIC PANEL
Anion gap: 6 (ref 5–15)
BUN: 14 mg/dL (ref 8–23)
CO2: 31 mmol/L (ref 22–32)
Calcium: 8.6 mg/dL — ABNORMAL LOW (ref 8.9–10.3)
Chloride: 101 mmol/L (ref 98–111)
Creatinine, Ser: 0.69 mg/dL (ref 0.61–1.24)
GFR, Estimated: >60 mL/min (ref >60)
Glucose, Bld: 110 mg/dL — ABNORMAL HIGH (ref 70–99)
Potassium: 3.5 mmol/L (ref 3.5–5.1)
Sodium: 138 mmol/L (ref 135–145)

## 2022-01-03 LAB — URIC ACID: Uric Acid, Serum: 5.4 mg/dL (ref 3.7–8.6)

## 2022-01-03 MED ORDER — DOXYCYCLINE HYCLATE 100 MG PO CAPS: 100.0000 mg | ORAL_CAPSULE | Freq: Two times a day (BID) | ORAL | 0 refills | Status: DC

## 2022-01-03 NOTE — ED Provider Notes (Signed)
[facility] EMERGENCY DEPARTMENT Provider Note   CSN: [id number] Arrival date & time: 01/03/22  1049     History  Chief Complaint  Patient presents with   Hand Pain    Darren Rose is a 88 y.o. male presenting for recurrence of right hand redness, swelling and pain which he woke up with it this morning.  Daughter at the bedside provides with majority of patient's history.  He was seen at [facility] in [place] 2 weeks ago for similar symptoms and was given a prescription for Keflex, he completed the course of antibiotics last week and his symptoms were gone until he woke this morning.  He reports mild discomfort with range of motion of his wrist and palpation across the dorsal hand where the redness and swelling has developed.  He denies any injuries to the hand including falls, direct blow or skin injuries such as abrasions or punctures.  Denies history of gout, he has been afebrile and there is no radiation of pain beyond his hand.  He took a dose of Tylenol earlier this morning which offered significant pain relief.  The history is provided by the patient and a relative.       Home Medications Prior to Admission medications   Medication Sig Start Date End Date Taking? Authorizing Provider  doxycycline (VIBRAMYCIN) 100 MG capsule Take 1 capsule (100 mg total) by mouth 2 (two) times daily. 01/03/22  Yes [place] Bula, Almyra Free, PA-C  alfuzosin (UROXATRAL) 10 MG 24 hr tablet Take 1 tablet (10 mg total) by mouth daily. 04/18/21   McKenzie, Candee Furbish, MD  amLODipine (NORVASC) 5 MG tablet Take 1 tablet (5 mg total) by mouth daily. 11/23/21   Dettinger, Fransisca Kaufmann, MD  aspirin EC 81 MG tablet Take 81 mg by mouth daily.    [provider]  atorvastatin (LIPITOR) 40 MG tablet Take 1 tablet (40 mg total) by mouth daily at 6 PM. 02/23/21   Dettinger, Fransisca Kaufmann, MD  clobetasol cream (TEMOVATE) 0.05 %  12/12/21   [provider]  diclofenac sodium (VOLTAREN) 1 % GEL APPLY 1 SMALL  AMOUNT TO SKIN EVERY TWELVE HOURS 09/03/18   [provider]  finasteride (PROSCAR) 5 MG tablet Take 1 tablet (5 mg total) by mouth daily. 04/18/21   McKenzie, Candee Furbish, MD  ketoconazole (NIZORAL) 2 % cream Apply topically. 01/14/20   [provider]  lisinopril (ZESTRIL) 40 MG tablet Take 1 tablet (40 mg total) by mouth daily. 02/23/21   Dettinger, Fransisca Kaufmann, MD  loratadine (CLARITIN) 10 MG tablet Take 1 tablet (10 mg total) by mouth daily. 05/19/21   Dettinger, Fransisca Kaufmann, MD  MYRBETRIQ 50 MG TB24 tablet Take 1 tablet (50 mg total) by mouth daily. 04/18/21   McKenzie, Candee Furbish, MD      Allergies    Patient has no known allergies.    Review of Systems   Review of Systems  Constitutional:  Negative for fever.  Musculoskeletal:  Positive for arthralgias. Negative for joint swelling and myalgias.  Skin:  Positive for color change. Negative for wound.  Neurological:  Negative for weakness and numbness.  All other systems reviewed and are negative.   Physical Exam Updated Vital Signs BP 133/66 (BP Location: Right Arm)   Pulse 61   Temp (!) 97.5 F (36.4 C) (Oral)   Resp 16   SpO2 97%  Physical Exam Constitutional:      Appearance: He is well-developed.  HENT:     Head: Atraumatic.  Cardiovascular:     Comments: Pulses equal bilaterally Musculoskeletal:        General: Tenderness present.     Cervical back: Normal range of motion.  Skin:    General: Skin is warm and dry.     Findings: Erythema present.     Comments: Mild erythema and edema right dorsal hand.  He has no pain in his fingers and can flex and extend fingers and wrist without discomfort.  There is no pain from his forearm through his elbow.  Distal sensation is intact with less than 2-second cap refill.  His skin is intact, there is no evidence for loss of skin integrity, no abrasions, stings or bites, nails and cuticles are well cared for.  Neurological:     Mental Status: He is alert.     Sensory: No  sensory deficit.     Motor: No weakness.     Deep Tendon Reflexes: Reflexes normal.     ED Results / Procedures / Treatments   Labs (all labs ordered are listed, but only abnormal results are displayed) Labs Reviewed  CBC WITH DIFFERENTIAL/PLATELET - Abnormal; Notable for the following components:      Result Value   RBC 4.08 (*)    Hemoglobin 12.1 (*)    HCT 38.3 (*)    Monocytes Absolute 1.1 (*)    All other components within normal limits  BASIC METABOLIC PANEL - Abnormal; Notable for the following components:   Glucose, Bld 110 (*)    Calcium 8.6 (*)    All other components within normal limits  URIC ACID    EKG None  Radiology DG Hand Complete Right  Result Date: 01/03/2022 CLINICAL DATA:  Pain and swelling for 2 weeks, no reported injury EXAM: RIGHT HAND - COMPLETE 3+ VIEW COMPARISON:  None Available. FINDINGS: No fracture or dislocation of the right hand. 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 Moderate radiocarpal arthrosis. Generally otherwise mild arthrosis about the hand. Screw fragment in the dorsal right third middle phalanx. Soft tissues unremarkable. IMPRESSION: 1. No fracture or dislocation of the right hand. 2. 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 3. 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 4. Moderate radiocarpal arthrosis. 5. Screw fragment in the dorsal right third middle phalanx, possibly surgical. Electronically Signed   By: Delanna Ahmadi M.D.   On: 01/03/2022 12:33    Procedures Procedures    Medications Ordered in ED Medications - No data to display  ED Course/ Medical Decision Making/ A&P                           Medical Decision Making Patient with return of redness and swelling right dorsal hand, no injuries, responded with course of Keflex but now returned, favoring this does represent a  cellulitis and patient may need a stronger antibiotic.  Labs were obtained however to rule out other etiologies such as gout, his uric acid is normal range and he does not have a history of gout.  He is relatively nontender to palpation at the site, doubt this represents gout.  An x-ray was completed when seen at [facility], this was repeated today to rule out occult fracture and is negative.  He does have a chronically subluxed thumb, patient reports this has been like this for many years and denies pain at the site.  He has been  prescribed a 10-day course of doxycycline.  Also advised elevation, gentle heat, plan follow-up with his PCP for recheck in 10 days if not completely resolving or if it returns once again after antibiotic is completed.  Amount and/or Complexity of Data Reviewed Labs: ordered.    Details: Labs are reassuring, specifically has a normal WBC count at 10.5, [place] is normal including a normal glucose, he has a normal uric acid level at 5.4. Radiology: ordered and independent interpretation performed.    Details: No acute fractures.  Risk Prescription drug management.           Final Clinical Impression(s) / ED Diagnoses Final diagnoses:  Cellulitis of right upper extremity    Rx / DC Orders ED Discharge Orders          Ordered    doxycycline (VIBRAMYCIN) 100 MG capsule  2 times daily        01/03/22 1307              Evalee Jefferson, PA-C 01/03/22 1549    Davonna Belling, MD 01/04/22 1020

## 2022-01-03 NOTE — Discharge Instructions (Signed)
Your lab tests today are negative.  You are being prescribed a stronger antibiotic, complete the entire course.  Also recommend elevation and gentle heating pad or warm compresses several times daily to your hand.  Plan to see Dr. Warrick Parisian for recheck of your hand within the next 7 to 10 days, sooner if your symptoms are not improving with this treatment plan, certainly if your symptoms return after completing this course of antibiotics.

## 2022-01-03 NOTE — ED Triage Notes (Signed)
Pt seen in [place] 2 weeks ago for right hand swelling. Was given abx for cellulitis and xrays negative per daughter with pt. States got better but woke this am with same symptoms. Right hand red, swelling and warm. Pt took tylenol at 0830 today.

## 2022-01-04 ENCOUNTER — Ambulatory Visit: Payer: Medicare Other

## 2022-01-11 ENCOUNTER — Telehealth: Payer: Self-pay

## 2022-01-11 NOTE — Telephone Encounter (Signed)
        Patient  visited [facility] on 12/5  Telephone encounter attempt :1st    A HIPAA compliant voice message was left requesting a return call.  Instructed patient to call back    [place]  [phone] [address], [place], [place] [address] Phone: [phone] Email: [email or web address]

## 2022-01-12 ENCOUNTER — Telehealth: Payer: Self-pay

## 2022-01-12 NOTE — Telephone Encounter (Signed)
        Patient  visited [facility] F[facility] on 12/5   Telephone encounter attempt :  2nd  A HIPAA compliant voice message was left requesting a return call.  Instructed patient to call back    [place]  [phone] [address], [place], [place] [address] Phone: [phone] Email: [email or web address]

## 2022-02-03 ENCOUNTER — Ambulatory Visit: Payer: Medicare Other | Admitting: Family Medicine

## 2022-02-08 ENCOUNTER — Ambulatory Visit (INDEPENDENT_AMBULATORY_CARE_PROVIDER_SITE_OTHER): Payer: Medicare Other | Admitting: Family Medicine

## 2022-02-08 ENCOUNTER — Encounter: Payer: Self-pay | Admitting: Family Medicine

## 2022-02-08 VITALS — BP 139/63 | HR 57 | Temp 97.6°F | Ht 64.0 in | Wt 125.0 lb

## 2022-02-08 DIAGNOSIS — M25511 Pain in right shoulder: Secondary | ICD-10-CM | POA: Diagnosis not present

## 2022-02-08 DIAGNOSIS — M19011 Primary osteoarthritis, right shoulder: Secondary | ICD-10-CM

## 2022-02-08 MED ORDER — METHYLPREDNISOLONE ACETATE 40 MG/ML IJ SUSP
40.0000 mg | Freq: Once | INTRAMUSCULAR | Status: AC
  Administered 2022-02-08: 80 mg via INTRAMUSCULAR

## 2022-02-08 NOTE — Progress Notes (Signed)
BP 139/63   Pulse (!) 57   Temp 97.6 F (36.4 C)   Ht '5\' 4"'$  (1.626 m)   Wt 125 lb (56.7 kg)   SpO2 97%   BMI 21.46 kg/m    Subjective:   Patient ID: Darren Rose, male    DOB: [birth year], 89 y.o.   MRN: [id number]  HPI: Darren Rose is a 89 y.o. male presenting on 02/08/2022 for Shoulder Pain (Right. Present for years. Voltaren does not help. Never had surgery or xray)   HPI Right shoulder pain Patient is coming in for right shoulder pain.  Is been chronic and has had 2 injections in the past, one he felt like helped a lot and the second 1 did not help as much.  We discussed other options.  He does currently use the Voltaren gel and has been using it sporadically and it does help some when he uses it.  He is coming in today because it still bothering him and sometimes it is worse especially with the weather changes.  He denies any loss of range of motion significantly but he does have some limited overhead but just gets a lot of popping and grinding and pain in there.  He has been diagnosed with right shoulder osteoarthritis and has actually been talked to about shoulder replacement in the past but he does not want to do that just yet.  Relevant past medical, surgical, family and social history reviewed and updated as indicated. Interim medical history since our last visit reviewed. Allergies and medications reviewed and updated.  Review of Systems  Constitutional:  Negative for chills and fever.  Respiratory:  Negative for shortness of breath and wheezing.   Cardiovascular:  Negative for chest pain and leg swelling.  Musculoskeletal:  Positive for arthralgias. Negative for back pain, myalgias and neck pain.  Skin:  Negative for rash.  All other systems reviewed and are negative.   Per HPI unless specifically indicated above   Allergies as of 02/08/2022   No Known Allergies      Medication List        Accurate as of February 08, 2022 11:59 AM. If you have any  questions, ask your nurse or doctor.          alfuzosin 10 MG 24 hr tablet Commonly known as: UROXATRAL Take 1 tablet (10 mg total) by mouth daily.   amLODipine 5 MG tablet Commonly known as: NORVASC Take 1 tablet (5 mg total) by mouth daily.   aspirin EC 81 MG tablet Take 81 mg by mouth daily.   atorvastatin 40 MG tablet Commonly known as: LIPITOR Take 1 tablet (40 mg total) by mouth daily at 6 PM.   clobetasol cream 0.05 % Commonly known as: TEMOVATE   diclofenac sodium 1 % Gel Commonly known as: VOLTAREN APPLY 1 SMALL AMOUNT TO SKIN EVERY TWELVE HOURS   doxycycline 100 MG capsule Commonly known as: VIBRAMYCIN Take 1 capsule (100 mg total) by mouth 2 (two) times daily.   finasteride 5 MG tablet Commonly known as: PROSCAR Take 1 tablet (5 mg total) by mouth daily.   ketoconazole 2 % cream Commonly known as: NIZORAL Apply topically.   lisinopril 40 MG tablet Commonly known as: ZESTRIL Take 1 tablet (40 mg total) by mouth daily.   loratadine 10 MG tablet Commonly known as: CLARITIN Take 1 tablet (10 mg total) by mouth daily.   Myrbetriq 50 MG Tb24 tablet Generic drug: mirabegron ER Take 1 tablet (50 mg  total) by mouth daily.         Objective:   BP 139/63   Pulse (!) 57   Temp 97.6 F (36.4 C)   Ht '5\' 4"'$  (1.626 m)   Wt 125 lb (56.7 kg)   SpO2 97%   BMI 21.46 kg/m   Wt Readings from Last 3 Encounters:  02/08/22 125 lb (56.7 kg)  11/23/21 127 lb (57.6 kg)  05/19/21 125 lb (56.7 kg)    Physical Exam Vitals and nursing note reviewed.  Constitutional:      Appearance: Normal appearance.  Musculoskeletal:     Right shoulder: Crepitus present. No swelling, deformity, tenderness or bony tenderness. Decreased range of motion (Some decreased overhead range of motion but minimal).  Neurological:     Mental Status: He is alert.     Right shoulder subacromial injection: Consent form signed. Risk factors of bleeding and infection discussed with  patient and patient is agreeable towards injection. Patient prepped with Betadine. Lateral approach towards injection used. Injected 80 mg of Depo-Medrol and 1 mL of 2% lidocaine. Patient tolerated procedure well and no side effects from noted. Minimal to no bleeding. Simple bandage applied after.   Assessment & Plan:   Problem List Items Addressed This Visit   None Visit Diagnoses     Primary osteoarthritis of right shoulder    -  Primary   Relevant Medications   methylPREDNISolone acetate (DEPO-MEDROL) injection 40 mg (Completed) (Start on 02/08/2022 12:00 PM)     Gave shoulder injection will discuss options in the future for possible orthopedic versus continued injections.  Follow up plan: Return if symptoms worsen or fail to improve.  Counseling provided for all of the vaccine components No orders of the defined types were placed in this encounter.   Caryl Pina, MD [facility] 02/08/2022, 11:59 AM

## 2022-02-15 ENCOUNTER — Other Ambulatory Visit: Payer: Self-pay | Admitting: Family Medicine

## 2022-04-21 ENCOUNTER — Ambulatory Visit: Payer: 59 | Admitting: Urology

## 2022-05-15 ENCOUNTER — Other Ambulatory Visit: Payer: Self-pay | Admitting: Family Medicine

## 2022-05-15 DIAGNOSIS — I10 Essential (primary) hypertension: Secondary | ICD-10-CM

## 2022-05-19 ENCOUNTER — Ambulatory Visit: Payer: 59 | Admitting: Urology

## 2022-05-25 ENCOUNTER — Encounter: Payer: Self-pay | Admitting: Family Medicine

## 2022-05-25 ENCOUNTER — Ambulatory Visit (INDEPENDENT_AMBULATORY_CARE_PROVIDER_SITE_OTHER): Payer: 59 | Admitting: Family Medicine

## 2022-05-25 VITALS — BP 124/76 | HR 86 | Ht 64.0 in | Wt 127.0 lb

## 2022-05-25 DIAGNOSIS — E785 Hyperlipidemia, unspecified: Secondary | ICD-10-CM

## 2022-05-25 DIAGNOSIS — Z23 Encounter for immunization: Secondary | ICD-10-CM

## 2022-05-25 DIAGNOSIS — I10 Essential (primary) hypertension: Secondary | ICD-10-CM

## 2022-05-25 DIAGNOSIS — R7303 Prediabetes: Secondary | ICD-10-CM

## 2022-05-25 LAB — BAYER DCA HB A1C WAIVED: HB A1C (BAYER DCA - WAIVED): 5.8 % — ABNORMAL HIGH (ref 4.8–5.6)

## 2022-05-25 NOTE — Progress Notes (Signed)
BP 124/76   Pulse 86   Ht  (1.626 m)   Wt 127 lb (57.6 kg)   SpO2 98%   BMI 21.80 kg/m    Subjective:   Patient ID: Eduard Penkala, male    DOB: 1931-02-06, 89 y.o.   MRN: [id number]  HPI: Andrell Bergeson is a 89 y.o. male presenting on 05/25/2022 for Medical Management of Chronic Issues, Hypertension, Hyperlipidemia, and Prediabetes   HPI Prediabetes Patient comes in today for recheck of his diabetes. Patient has been currently taking no medication, diet control. Patient is currently on an ACE inhibitor/ARB. Patient has not seen an ophthalmologist this year. Patient denies any new issues with their feet. The symptom started onset as an adult hypertension and hyperlipidemia ARE RELATED TO DM   Hypertension Patient is currently on amlodipine and lisinopril, and their blood pressure today is 124/76. Patient denies any lightheadedness or dizziness. Patient denies headaches, blurred vision, chest pains, shortness of breath, or weakness. Denies any side effects from medication and is content with current medication.   Hyperlipidemia Patient is coming in for recheck of his hyperlipidemia. The patient is currently taking atorvastatin. They deny any issues with myalgias or history of liver damage from it. They deny any focal numbness or weakness or chest pain.   Relevant past medical, surgical, family and social history reviewed and updated as indicated. Interim medical history since our last visit reviewed. Allergies and medications reviewed and updated.  Review of Systems  Constitutional:  Negative for chills and fever.  Eyes:  Negative for visual disturbance.  Respiratory:  Negative for shortness of breath and wheezing.   Cardiovascular:  Negative for chest pain and leg swelling.  Musculoskeletal:  Negative for back pain and gait problem.  Skin:  Negative for rash.  Neurological:  Negative for dizziness, weakness and light-headedness.  All other systems reviewed and are  negative.   Per HPI unless specifically indicated above   Allergies as of 05/25/2022   No Known Allergies      Medication List        Accurate as of May 25, 2022 11:13 AM. If you have any questions, ask your nurse or doctor.          STOP taking these medications    doxycycline 100 MG capsule Commonly known as: VIBRAMYCIN Stopped by: Elige Radon Arlisha Patalano, MD       TAKE these medications    alfuzosin 10 MG 24 hr tablet Commonly known as: UROXATRAL Take 1 tablet (10 mg total) by mouth daily.   amLODipine 5 MG tablet Commonly known as: NORVASC Take 1 tablet (5 mg total) by mouth daily.   aspirin EC 81 MG tablet Take 81 mg by mouth daily.   atorvastatin 40 MG tablet Commonly known as: LIPITOR TAKE 1 TABLET BY MOUTH DAILY AT 6 PM.   clobetasol cream 0.05 % Commonly known as: TEMOVATE   diclofenac sodium 1 % Gel Commonly known as: VOLTAREN APPLY 1 SMALL AMOUNT TO SKIN EVERY TWELVE HOURS   finasteride 5 MG tablet Commonly known as: PROSCAR Take 1 tablet (5 mg total) by mouth daily.   ketoconazole 2 % cream Commonly known as: NIZORAL Apply topically.   lisinopril 40 MG tablet Commonly known as: ZESTRIL TAKE 1 TABLET BY MOUTH EVERY DAY   loratadine 10 MG tablet Commonly known as: CLARITIN Take 1 tablet (10 mg total) by mouth daily.   Myrbetriq 50 MG Tb24 tablet Generic drug: mirabegron ER Take 1 tablet (50 mg  total) by mouth daily.         Objective:   BP 124/76   Pulse 86   Ht  (1.626 m)   Wt 127 lb (57.6 kg)   SpO2 98%   BMI 21.80 kg/m   Wt Readings from Last 3 Encounters:  05/25/22 127 lb (57.6 kg)  02/08/22 125 lb (56.7 kg)  11/23/21 127 lb (57.6 kg)    Physical Exam Vitals and nursing note reviewed.  Constitutional:      General: He is not in acute distress.    Appearance: He is well-developed. He is not diaphoretic.  Eyes:     General: No scleral icterus.    Conjunctiva/sclera: Conjunctivae normal.  Neck:      Thyroid: No thyromegaly.  Cardiovascular:     Rate and Rhythm: Normal rate and regular rhythm.     Heart sounds: Normal heart sounds. No murmur heard. Pulmonary:     Effort: Pulmonary effort is normal. No respiratory distress.     Breath sounds: Normal breath sounds. No wheezing.  Musculoskeletal:        General: No swelling. Normal range of motion.     Cervical back: Neck supple.  Lymphadenopathy:     Cervical: No cervical adenopathy.  Skin:    General: Skin is warm and dry.     Findings: No rash.  Neurological:     Mental Status: He is alert and oriented to person, place, and time.     Coordination: Coordination normal.  Psychiatric:        Behavior: Behavior normal.       Assessment & Plan:   Problem List Items Addressed This Visit       Cardiovascular and Mediastinum   Essential hypertension, benign   Relevant Orders   CBC with Differential/Platelet   CMP14+EGFR   Lipid panel   Bayer DCA Hb A1c Waived     Other   Hyperlipidemia with target LDL less than 100 - Primary   Relevant Orders   CBC with Differential/Platelet   CMP14+EGFR   Lipid panel   Bayer DCA Hb A1c Waived   Prediabetes   Relevant Orders   CBC with Differential/Platelet   CMP14+EGFR   Lipid panel   Bayer DCA Hb A1c Waived   Other Visit Diagnoses     Need for shingles vaccine       Relevant Orders   Zoster Recombinant (Shingrix ) (Completed)       Recently had a upper dental extraction and they were concerned because there is no antibiotic, recommended to discuss it with dentist. A1c was good at 5.8. Follow up plan: Return in about 6 months (around 11/24/2022), or if symptoms worsen or fail to improve, for Prediabetes and hypertension and hyperlipidemia.  Counseling provided for all of the vaccine components Orders Placed This Encounter  Procedures   Zoster Recombinant (Shingrix )   CBC with Differential/Platelet   CMP14+EGFR   Lipid panel   Bayer DCA Hb A1c Waived    Arville Care, MD [facility] 05/25/2022, 11:13 AM

## 2022-05-26 LAB — CBC WITH DIFFERENTIAL/PLATELET
Basophils Absolute: 0.1 10*3/uL (ref 0.0–0.2)
Basos: 1 %
EOS (ABSOLUTE): 0.4 10*3/uL (ref 0.0–0.4)
Eos: 5 %
Hematocrit: 34.9 % — ABNORMAL LOW (ref 37.5–51.0)
Hemoglobin: 11.5 g/dL — ABNORMAL LOW (ref 13.0–17.7)
Immature Grans (Abs): 0 10*3/uL (ref 0.0–0.1)
Immature Granulocytes: 0 %
Lymphocytes Absolute: 1.9 10*3/uL (ref 0.7–3.1)
Lymphs: 25 %
MCH: 29.4 pg (ref 26.6–33.0)
MCHC: 33 g/dL (ref 31.5–35.7)
MCV: 89 fL (ref 79–97)
Monocytes Absolute: 0.7 10*3/uL (ref 0.1–0.9)
Monocytes: 9 %
Neutrophils Absolute: 4.4 10*3/uL (ref 1.4–7.0)
Neutrophils: 60 %
Platelets: 236 10*3/uL (ref 150–450)
RBC: 3.91 x10E6/uL — ABNORMAL LOW (ref 4.14–5.80)
RDW: 12.4 % (ref 11.6–15.4)
WBC: 7.4 10*3/uL (ref 3.4–10.8)

## 2022-05-26 LAB — CMP14+EGFR
ALT: 11 IU/L (ref 0–44)
AST: 21 IU/L (ref 0–40)
Albumin/Globulin Ratio: 1.6 (ref 1.2–2.2)
Albumin: 3.9 g/dL (ref 3.7–4.7)
Alkaline Phosphatase: 94 IU/L (ref 44–121)
BUN/Creatinine Ratio: 22 (ref 10–24)
BUN: 18 mg/dL (ref 8–27)
Bilirubin Total: 0.5 mg/dL (ref 0.0–1.2)
CO2: 24 mmol/L (ref 20–29)
Calcium: 9 mg/dL (ref 8.6–10.2)
Chloride: 103 mmol/L (ref 96–106)
Creatinine, Ser: 0.83 mg/dL (ref 0.76–1.27)
Globulin, Total: 2.4 g/dL (ref 1.5–4.5)
Glucose: 91 mg/dL (ref 70–99)
Potassium: 4.1 mmol/L (ref 3.5–5.2)
Sodium: 141 mmol/L (ref 134–144)
Total Protein: 6.3 g/dL (ref 6.0–8.5)
eGFR: 84 mL/min/{1.73_m2} (ref >59)

## 2022-05-26 LAB — LIPID PANEL
Chol/HDL Ratio: 2.3 ratio (ref 0.0–5.0)
Cholesterol, Total: 96 mg/dL — ABNORMAL LOW (ref 100–199)
HDL: 41 mg/dL (ref >39)
LDL Chol Calc (NIH): 43 mg/dL (ref 0–99)
Triglycerides: 46 mg/dL (ref 0–149)
VLDL Cholesterol Cal: 12 mg/dL (ref 5–40)

## 2022-05-27 ENCOUNTER — Other Ambulatory Visit: Payer: Self-pay | Admitting: Family Medicine

## 2022-05-30 ENCOUNTER — Other Ambulatory Visit: Payer: Self-pay

## 2022-05-30 ENCOUNTER — Telehealth: Payer: Self-pay

## 2022-05-30 ENCOUNTER — Other Ambulatory Visit: Payer: Self-pay | Admitting: Urology

## 2022-05-30 DIAGNOSIS — N400 Enlarged prostate without lower urinary tract symptoms: Secondary | ICD-10-CM

## 2022-05-30 MED ORDER — ALFUZOSIN HCL ER 10 MG PO TB24: 10.0000 mg | ORAL_TABLET | Freq: Every day | ORAL | 0 refills | Status: DC

## 2022-05-30 MED ORDER — FINASTERIDE 5 MG PO TABS: 5.0000 mg | ORAL_TABLET | Freq: Every day | ORAL | 0 refills | Status: DC

## 2022-05-30 NOTE — Telephone Encounter (Signed)
Patient called and advised they needed a refill on medication below.   Medication: alfuzosin (UROXATRAL) 10 MG 24 hr tablet  finasteride (PROSCAR) 5 MG tablet      Pharmacy: finasteride (PROSCAR) 5 MG tablet    Thank you

## 2022-05-30 NOTE — Telephone Encounter (Signed)
Rx refilled to last until next apt on 06/24.  Detailed message left informing pt of refill and to keep scheduled apt for future refills.

## 2022-06-02 ENCOUNTER — Telehealth: Payer: Self-pay | Admitting: Family Medicine

## 2022-06-02 NOTE — Telephone Encounter (Signed)
Pts son called regarding pts lab results. Reviewed Dr Oris Drone result notes with him. He voiced understanding. Wants to know what specific foods pt should be eating for protein and is there any other recommendations they could get pt to start doing to help. Also says pt just had a procedure done to remove a cyst and says there was some blood loss. Wants to know if this could be why Hgb is slightly low.

## 2022-06-04 NOTE — Telephone Encounter (Signed)
Yes the procedure could be why his hemoglobin is slightly low.  Will watch it in the future.  As far as protein, I would recommend beans and lentils and fish and chicken and [place] and beef and pork

## 2022-06-05 NOTE — Telephone Encounter (Signed)
Son made aware and understood. He has no further concerns.

## 2022-06-09 ENCOUNTER — Other Ambulatory Visit: Payer: Self-pay | Admitting: Urology

## 2022-06-09 DIAGNOSIS — R350 Frequency of micturition: Secondary | ICD-10-CM

## 2022-06-12 ENCOUNTER — Other Ambulatory Visit: Payer: Self-pay

## 2022-06-12 ENCOUNTER — Telehealth: Payer: Self-pay

## 2022-06-12 DIAGNOSIS — R350 Frequency of micturition: Secondary | ICD-10-CM

## 2022-06-12 MED ORDER — MYRBETRIQ 50 MG PO TB24: 50.0000 mg | ORAL_TABLET | Freq: Every day | ORAL | 3 refills | Status: DC

## 2022-06-12 NOTE — Telephone Encounter (Signed)
Patient called and advised they needed a refill on medication below. He requested a 90 day supply if that was an option.   Medication: MYRBETRIQ 50 MG TB24 tablet    Pharmacy: [id number] - [place], [place] - [address]    Thank you

## 2022-06-12 NOTE — Telephone Encounter (Signed)
90 rx sent to pharmacy per patient request

## 2022-07-08 ENCOUNTER — Other Ambulatory Visit: Payer: Self-pay | Admitting: Urology

## 2022-07-08 DIAGNOSIS — N400 Enlarged prostate without lower urinary tract symptoms: Secondary | ICD-10-CM

## 2022-07-11 ENCOUNTER — Telehealth: Payer: Self-pay

## 2022-07-11 NOTE — Telephone Encounter (Signed)
Return call to patient's son for refill on medicine finasteride. Patient's son is made aware that Dr. Ronne Binning refilled Finasteride and sent to pharmacy on file. Patient's son voiced understanding

## 2022-07-14 ENCOUNTER — Ambulatory Visit (INDEPENDENT_AMBULATORY_CARE_PROVIDER_SITE_OTHER): Payer: 59

## 2022-07-14 VITALS — Ht 65.0 in | Wt 127.0 lb

## 2022-07-14 DIAGNOSIS — Z0000 Encounter for general adult medical examination without abnormal findings: Secondary | ICD-10-CM
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
## 2022-07-14 NOTE — Patient Instructions (Signed)
Darren Rose , Thank you for taking time to come for your Medicare Wellness Visit. I appreciate your ongoing commitment to your health goals. Please review the following plan we discussed and let me know if I can assist you in the future.   These are the goals we discussed:  Goals      DIET - INCREASE WATER INTAKE        This is a list of the screening recommended for you and due dates:  Health Maintenance  Topic Date Due   COVID-19 Vaccine (4 - 2023-24 season) 09/30/2021   Zoster (Shingles) Vaccine (2 of 2) 07/20/2022   Flu Shot  08/31/2022   Medicare Annual Wellness Visit  07/14/2023   DTaP/Tdap/Td vaccine (3 - Td or Tdap) 09/30/2029   Pneumonia Vaccine  Completed   HPV Vaccine  Aged Out    Advanced directives: Advance directive discussed with you today. I have provided a copy for you to complete at home and have notarized. Once this is complete please bring a copy in to our office so we can scan it into your chart.   Conditions/risks identified: Aim for 30 minutes of exercise or brisk walking, 6-8 glasses of water, and 5 servings of fruits and vegetables each day.   Next appointment: Follow up in one year for your annual wellness visit.   Preventive Care 48 Years and Older, Male  Preventive care refers to lifestyle choices and visits with your health care provider that can promote health and wellness. What does preventive care include? A yearly physical exam. This is also called an annual well check. Dental exams once or twice a year. Routine eye exams. Ask your health care provider how often you should have your eyes checked. Personal lifestyle choices, including: Daily care of your teeth and gums. Regular physical activity. Eating a healthy diet. Avoiding tobacco and drug use. Limiting alcohol use. Practicing safe sex. Taking low doses of aspirin every day. Taking vitamin and mineral supplements as recommended by your health care provider. What happens during an annual  well check? The services and screenings done by your health care provider during your annual well check will depend on your age, overall health, lifestyle risk factors, and family history of disease. Counseling  Your health care provider may ask you questions about your: Alcohol use. Tobacco use. Drug use. Emotional well-being. Home and relationship well-being. Sexual activity. Eating habits. History of falls. Memory and ability to understand (cognition). Work and work Astronomer. Screening  You may have the following tests or measurements: Height, weight, and BMI. Blood pressure. Lipid and cholesterol levels. These may be checked every 5 years, or more frequently if you are over 34 years old. Skin check. Lung cancer screening. You may have this screening every year starting at age 54 if you have a 30-pack-year history of smoking and currently smoke or have quit within the past 15 years. Fecal occult blood test (FOBT) of the stool. You may have this test every year starting at age 43. Flexible sigmoidoscopy or colonoscopy. You may have a sigmoidoscopy every 5 years or a colonoscopy every 10 years starting at age 39. Prostate cancer screening. Recommendations will vary depending on your family history and other risks. Hepatitis C blood test. Hepatitis B blood test. Sexually transmitted disease (STD) testing. Diabetes screening. This is done by checking your blood sugar (glucose) after you have not eaten for a while (fasting). You may have this done every 1-3 years. Abdominal aortic aneurysm (AAA) screening. You  may need this if you are a current or former smoker. Osteoporosis. You may be screened starting at age 57 if you are at high risk. Talk with your health care provider about your test results, treatment options, and if necessary, the need for more tests. Vaccines  Your health care provider may recommend certain vaccines, such as: Influenza vaccine. This is recommended every  year. Tetanus, diphtheria, and acellular pertussis (Tdap, Td) vaccine. You may need a Td booster every 10 years. Zoster vaccine. You may need this after age 30. Pneumococcal 13-valent conjugate (PCV13) vaccine. One dose is recommended after age 82. Pneumococcal polysaccharide (PPSV23) vaccine. One dose is recommended after age 42. Talk to your health care provider about which screenings and vaccines you need and how often you need them. This information is not intended to replace advice given to you by your health care provider. Make sure you discuss any questions you have with your health care provider. Document Released: 02/12/2015 Document Revised: 10/06/2015 Document Reviewed: 11/17/2014 Elsevier Interactive Patient Education  2017 [place] Prevention in the Home Falls can cause injuries. They can happen to people of all ages. There are many things you can do to make your home safe and to help prevent falls. What can I do on the outside of my home? Regularly fix the edges of walkways and driveways and fix any cracks. Remove anything that might make you trip as you walk through a door, such as a raised step or threshold. Trim any bushes or trees on the path to your home. Use bright outdoor lighting. Clear any walking paths of anything that might make someone trip, such as rocks or tools. Regularly check to see if handrails are loose or broken. Make sure that both sides of any steps have handrails. Any raised decks and porches should have guardrails on the edges. Have any leaves, snow, or ice cleared regularly. Use sand or salt on walking paths during winter. Clean up any spills in your garage right away. This includes oil or grease spills. What can I do in the bathroom? Use night lights. Install grab bars by the toilet and in the tub and shower. Do not use towel bars as grab bars. Use non-skid mats or decals in the tub or shower. If you need to sit down in the shower, use a  plastic, non-slip stool. Keep the floor dry. Clean up any water that spills on the floor as soon as it happens. Remove soap buildup in the tub or shower regularly. Attach bath mats securely with double-sided non-slip rug tape. Do not have throw rugs and other things on the floor that can make you trip. What can I do in the bedroom? Use night lights. Make sure that you have a light by your bed that is easy to reach. Do not use any sheets or blankets that are too big for your bed. They should not hang down onto the floor. Have a firm chair that has side arms. You can use this for support while you get dressed. Do not have throw rugs and other things on the floor that can make you trip. What can I do in the kitchen? Clean up any spills right away. Avoid walking on wet floors. Keep items that you use a lot in easy-to-reach places. If you need to reach something above you, use a strong step stool that has a grab bar. Keep electrical cords out of the way. Do not use floor polish or wax that  makes floors slippery. If you must use wax, use non-skid floor wax. Do not have throw rugs and other things on the floor that can make you trip. What can I do with my stairs? Do not leave any items on the stairs. Make sure that there are handrails on both sides of the stairs and use them. Fix handrails that are broken or loose. Make sure that handrails are as long as the stairways. Check any carpeting to make sure that it is firmly attached to the stairs. Fix any carpet that is loose or worn. Avoid having throw rugs at the top or bottom of the stairs. If you do have throw rugs, attach them to the floor with carpet tape. Make sure that you have a light switch at the top of the stairs and the bottom of the stairs. If you do not have them, ask someone to add them for you. What else can I do to help prevent falls? Wear shoes that: Do not have high heels. Have rubber bottoms. Are comfortable and fit you  well. Are closed at the toe. Do not wear sandals. If you use a stepladder: Make sure that it is fully opened. Do not climb a closed stepladder. Make sure that both sides of the stepladder are locked into place. Ask someone to hold it for you, if possible. Clearly mark and make sure that you can see: Any grab bars or handrails. First and last steps. Where the edge of each step is. Use tools that help you move around (mobility aids) if they are needed. These include: Canes. Walkers. Scooters. Crutches. Turn on the lights when you go into a dark area. Replace any light bulbs as soon as they burn out. Set up your furniture so you have a clear path. Avoid moving your furniture around. If any of your floors are uneven, fix them. If there are any pets around you, be aware of where they are. Review your medicines with your doctor. Some medicines can make you feel dizzy. This can increase your chance of falling. Ask your doctor what other things that you can do to help prevent falls. This information is not intended to replace advice given to you by your health care provider. Make sure you discuss any questions you have with your health care provider. Document Released: 11/12/2008 Document Revised: 06/24/2015 Document Reviewed: 02/20/2014 Elsevier Interactive Patient Education  2017 [employer].

## 2022-07-22 ENCOUNTER — Other Ambulatory Visit: Payer: Self-pay | Admitting: Urology

## 2022-07-24 ENCOUNTER — Ambulatory Visit (INDEPENDENT_AMBULATORY_CARE_PROVIDER_SITE_OTHER): Payer: 59 | Admitting: Urology

## 2022-07-24 VITALS — BP 105/64 | HR 87

## 2022-07-24 DIAGNOSIS — N3281 Overactive bladder: Secondary | ICD-10-CM | POA: Diagnosis not present

## 2022-07-24 DIAGNOSIS — N400 Enlarged prostate without lower urinary tract symptoms: Secondary | ICD-10-CM

## 2022-07-24 DIAGNOSIS — R350 Frequency of micturition: Secondary | ICD-10-CM

## 2022-07-24 LAB — URINALYSIS, ROUTINE W REFLEX MICROSCOPIC
Bilirubin, UA: NEGATIVE
Glucose, UA: NEGATIVE
Ketones, UA: NEGATIVE
Leukocytes,UA: NEGATIVE
Nitrite, UA: NEGATIVE
Protein,UA: NEGATIVE
RBC, UA: NEGATIVE
Specific Gravity, UA: <1.005 — ABNORMAL LOW (ref 1.005–1.030)
Urobilinogen, Ur: 0.2 mg/dL (ref 0.2–1.0)
pH, UA: 6 (ref 5.0–7.5)

## 2022-07-24 MED ORDER — ALFUZOSIN HCL ER 10 MG PO TB24: 10.0000 mg | ORAL_TABLET | Freq: Every day | ORAL | 3 refills | Status: DC

## 2022-07-24 MED ORDER — FINASTERIDE 5 MG PO TABS: 5.0000 mg | ORAL_TABLET | Freq: Every day | ORAL | 3 refills | Status: DC

## 2022-07-24 MED ORDER — MYRBETRIQ 50 MG PO TB24: 50.0000 mg | ORAL_TABLET | Freq: Every day | ORAL | 3 refills | Status: DC
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
## 2022-07-26 ENCOUNTER — Ambulatory Visit (INDEPENDENT_AMBULATORY_CARE_PROVIDER_SITE_OTHER): Payer: 59

## 2022-07-26 DIAGNOSIS — Z23 Encounter for immunization: Secondary | ICD-10-CM

## 2022-07-26 NOTE — Progress Notes (Signed)
2nd Shingrix Vaccine given right deltoid - patient tolerated well

## 2022-07-30 ENCOUNTER — Encounter: Payer: Self-pay | Admitting: Urology

## 2022-07-30 NOTE — Patient Instructions (Signed)
Vejiga hiperactiva en adultos Overactive Bladder, Adult  Vejiga hiperactiva es una afeccin en la que la persona tiene una necesidad sbita y frecuente de Garment/textile technologist. La persona tambin podra tener una prdida de orina si no puede llegar al bao con la rapidez suficiente (incontinencia urinaria). A veces, los sntomas pueden interferir en el trabajo o las actividades sociales. Cules son las causas? La vejiga hiperactiva est asociada con seales nerviosas deficientes entre la vejiga y Nurse, mental health. La vejiga puede recibir la seal de vaciarse antes de que est llena. Usted tambin puede tener msculos muy sensibles que hacen que la vejiga se contraiga demasiado pronto. Esta afeccin tambin puede deberse a otros factores, como los siguientes: Afecciones mdicas: Infeccin de las vas urinarias. Infeccin de los tejidos cercanos. Agrandamiento de la prstata. Clculos en la vejiga, inflamacin o tumores. Diabetes. Debilidad de los msculos o nervios, especialmente a causa de estas afecciones: Lesin en la mdula espinal. Accidente cerebrovascular. Esclerosis mltiple. Enfermedad de Parkinson. Otras causas: Ciruga en el tero o la uretra. Consumir cafena o alcohol en exceso. Ciertos medicamentos, especialmente aquellos que eliminan el exceso de lquido del cuerpo (diurticos). Estreimiento. Qu incrementa el riesgo? Puede correr un mayor riesgo de desarrollar vejiga hiperactiva si usted: Es Media planner. Fuma. Est atravesando la menopausia. Tiene problemas de prstata. Tiene una enfermedad neurolgica, como accidente cerebrovascular, demencia, enfermedad de Parkinson o esclerosis mltiple (EM). Come o toma alcohol, alimentos picantes, cafena y otras cosas que irritan la vejiga. Tiene sobrepeso u obesidad. Cules son los signos o sntomas? Los sntomas de esta afeccin incluyen una necesidad repentina y fuerte de Garment/textile technologist. Otros sntomas pueden incluir los siguientes: Prdida de  [place]. Orina 8 o ms veces por da. Levantarse para orinar 2 o ms veces durante la noche. Cmo se diagnostica? Esta afeccin se puede diagnosticar en funcin de lo siguiente: Los sntomas y los antecedentes mdicos. Un examen fsico. Anlisis de sangre o de orina para detectar posibles causas, como una infeccin. Es posible que tambin tenga que Teacher, adult education a un mdico especialista en problemas de las vas [place]. Este mdico es Heritage manager. Cmo se trata? El tratamiento para el trastorno de vejiga hiperactiva depende de la causa y la gravedad de su enfermedad. El tratamiento puede incluir: Entrenamiento de la vejiga, por ejemplo: Aprender a controlar la necesidad urgente de Garment/textile technologist siguiendo un programa para orinar en intervalos regulares. Hacer ejercicios de Kegel para fortalecer los msculos del piso plvico que sostienen la vejiga. Dispositivos especiales, por ejemplo: Biorretroalimentacin. Utiliza sensores para ayudarlo a Personnel officer atento a las seales del cuerpo. Estimulacin elctrica. Utiliza electrodos que se colocan dentro del cuerpo (implantados) o fuera del cuerpo. Estos electrodos envan pulsos elctricos suaves para fortalecer los nervios o los msculos que controlan la vejiga. Las mujeres pueden utilizar un dispositivo de plstico, llamado pesario, que calza en la vagina y sostiene la vejiga. Medicamentos, como por ejemplo: Antibiticos para tratar las infecciones en la vejiga. Antiespasmdicos para evitar que la vejiga elimine orina en el momento incorrecto. Antidepresivos tricclicos para relajar los msculos de la vejiga. Inyecciones de toxina botulnica tipo A directamente en el tejido de la vejiga para relajar los msculos de la vejiga. Ciruga, como: Puede implantarse un dispositivo para ayudar a Chief Technology Officer las seales nerviosas que controlan la miccin. Puede implantarse un electrodo para estimular las seales elctricas en la vejiga. Puede realizarse un procedimiento  para cambiar la forma de la vejiga. Esto solo se Merchandiser, retail graves. Siga estas instrucciones en su casa: Comida y  bebida  [employer] en su estilo de vida o dieta segn las recomendaciones de su mdico. Estos pueden incluir: Product manager lquidos a lo largo del da y no solo con las comidas. Reducir la ingesta de cafena o alcohol. Ingerir una dieta saludable y equilibrada para Multimedia programmer estreimiento. Esto puede incluir: Elegir alimentos ricos en fibra, como frijoles, cereales integrales y frutas y verduras frescas. Limitar el consumo de alimentos ricos en grasas y azcares procesados, como alimentos fritos o dulces. Estilo de vida  Baje de [place], si es necesario. No consuma ningn producto que contenga nicotina o tabaco. Estos incluyen cigarrillos, tabaco para mascar y aparatos de vapeo, como los Administrator, Civil Service. Si necesita ayuda para dejar de consumir estos productos, consulte al mdico. Indicaciones generales Use los medicamentos de venta libre y los recetados solamente como se lo haya indicado el mdico. Si le recetaron un antibitico, tmelo como se lo haya indicado el mdico. No deje de tomar el antibitico aunque comience a sentirse mejor. Use los implantes o el pesario como se lo haya indicado el mdico. Si es necesario, use apsitos para Environmental health practitioner cualquier prdida de orina que pueda [place]. Lleve un diario para registrar la cantidad de lquidos que ingiere y cundo lo hace, y cundo Special educational needs teacher. Esto ayudar a su mdico a Interior and spatial designer. Cumpla con todas las visitas de seguimiento. Esto es importante. Comunquese con un mdico si: Tiene fiebre o escalofros. Los sntomas no mejoran con Scientist, research (medical). El dolor y las molestias [place]. Tiene necesidad urgente de orinar con mayor frecuencia. Solicite ayuda de inmediato si: No puede controlar la vejiga. Resumen Vejiga hiperactiva se refiere a Insurance claims handler en la que la persona tiene una necesidad sbita y  frecuente de Geographical information systems officer. Varias afecciones pueden causar sntomas de vejiga hiperactiva. El tratamiento para la vejiga hiperactiva depende de la causa y la gravedad de la afeccin. Hacer cambios en el estilo de vida, hacer ejercicios de Kegel, llevar un diario y tomar medicamentos puede ayudar con esta afeccin. Esta informacin no tiene Theme park manager el consejo del mdico. Asegrese de hacerle al mdico cualquier pregunta que tenga. Document Revised: 11/21/2019 Document Reviewed: 10/28/2019 Elsevier Patient Education  2024 [employer].

## 2022-08-18 ENCOUNTER — Other Ambulatory Visit: Payer: Self-pay | Admitting: Family Medicine

## 2022-08-18 DIAGNOSIS — I10 Essential (primary) hypertension: Secondary | ICD-10-CM

## 2022-11-03 ENCOUNTER — Ambulatory Visit (INDEPENDENT_AMBULATORY_CARE_PROVIDER_SITE_OTHER): Payer: 59 | Admitting: Family Medicine

## 2022-11-03 ENCOUNTER — Encounter: Payer: Self-pay | Admitting: Family Medicine

## 2022-11-03 VITALS — BP 120/73 | HR 61 | Ht 65.0 in

## 2022-11-03 DIAGNOSIS — E785 Hyperlipidemia, unspecified: Secondary | ICD-10-CM | POA: Diagnosis not present

## 2022-11-03 DIAGNOSIS — R7303 Prediabetes: Secondary | ICD-10-CM | POA: Diagnosis not present

## 2022-11-03 DIAGNOSIS — I10 Essential (primary) hypertension: Secondary | ICD-10-CM

## 2022-11-03 DIAGNOSIS — Z23 Encounter for immunization: Secondary | ICD-10-CM

## 2022-11-03 DIAGNOSIS — N400 Enlarged prostate without lower urinary tract symptoms: Secondary | ICD-10-CM | POA: Diagnosis not present

## 2022-11-03 LAB — BAYER DCA HB A1C WAIVED: HB A1C (BAYER DCA - WAIVED): 5.3 % (ref 4.8–5.6)

## 2022-11-03 NOTE — Progress Notes (Signed)
BP 120/73   Pulse 61   Ht 5\' 5"  (1.651 m)   SpO2 98%   BMI 21.13 kg/m    Subjective:   Patient ID: Darren Rose, male    DOB: 03-05-1931, 89 y.o.   MRN: [id number]  HPI: Darren Rose is a 89 y.o. male presenting on 11/03/2022 for Medical Management of Chronic Issues, Hyperlipidemia, and Hypertension   HPI Hypertension Patient is currently on amlodipine and lisinopril, and their blood pressure today is 120/73. Patient denies any lightheadedness or dizziness. Patient denies headaches, blurred vision, chest pains, shortness of breath, or weakness. Denies any side effects from medication and is content with current medication.   Hyperlipidemia Patient is coming in for recheck of his hyperlipidemia. The patient is currently taking atorvastatin. They deny any issues with myalgias or history of liver damage from it. They deny any focal numbness or weakness or chest pain.   Prediabetes Patient comes in today for recheck of his diabetes. Patient has been currently taking no medicine. Patient is currently on an ACE inhibitor/ARB. Patient has not seen an ophthalmologist this year. Patient denies any new issues with their feet. The symptom started onset as an adult hypertension and hyperlipidemia ARE RELATED TO DM   BPH Patient is coming in for recheck on BPH Symptoms: None currently  Medication: alfuzosin and finasteride and Myrbetriq and sees urology Last PSA: Sees urology  Relevant past medical, surgical, family and social history reviewed and updated as indicated. Interim medical history since our last visit reviewed. Allergies and medications reviewed and updated.  Review of Systems  Constitutional:  Negative for chills and fever.  Eyes:  Negative for visual disturbance.  Respiratory:  Negative for shortness of breath and wheezing.   Cardiovascular:  Negative for chest pain and leg swelling.  Musculoskeletal:  Negative for back pain and gait problem.  Skin:  Negative for  rash.  Neurological:  Negative for dizziness and light-headedness.  All other systems reviewed and are negative.   Per HPI unless specifically indicated above   Allergies as of 11/03/2022   No Known Allergies      Medication List        Accurate as of November 03, 2022 11:22 AM. If you have any questions, ask your nurse or doctor.          alfuzosin 10 MG 24 hr tablet Commonly known as: UROXATRAL Take 1 tablet (10 mg total) by mouth daily.   alfuzosin 10 MG 24 hr tablet Commonly known as: UROXATRAL TAKE 1 TABLET BY MOUTH EVERY DAY   amLODipine 5 MG tablet Commonly known as: NORVASC Take 1 tablet (5 mg total) by mouth daily.   aspirin EC 81 MG tablet Take 81 mg by mouth daily.   atorvastatin 40 MG tablet Commonly known as: LIPITOR TAKE 1 TABLET BY MOUTH DAILY AT 6 PM.   clobetasol cream 0.05 % Commonly known as: TEMOVATE   diclofenac sodium 1 % Gel Commonly known as: VOLTAREN   finasteride 5 MG tablet Commonly known as: PROSCAR Take 1 tablet (5 mg total) by mouth daily.   ketoconazole 2 % cream Commonly known as: NIZORAL Apply topically.   lisinopril 40 MG tablet Commonly known as: ZESTRIL TAKE 1 TABLET BY MOUTH EVERY DAY   loratadine 10 MG tablet Commonly known as: CLARITIN Take 1 tablet (10 mg total) by mouth daily.   Myrbetriq 50 MG Tb24 tablet Generic drug: mirabegron ER Take 1 tablet (50 mg total) by mouth daily.  Objective:   BP 120/73   Pulse 61   Ht 5\' 5"  (1.651 m)   SpO2 98%   BMI 21.13 kg/m   Wt Readings from Last 3 Encounters:  07/14/22 127 lb (57.6 kg)  05/25/22 127 lb (57.6 kg)  02/08/22 125 lb (56.7 kg)    Physical Exam Vitals and nursing note reviewed.  Constitutional:      General: He is not in acute distress.    Appearance: He is well-developed. He is not diaphoretic.  Eyes:     General: No scleral icterus.    Conjunctiva/sclera: Conjunctivae normal.  Neck:     Thyroid: No thyromegaly.  Cardiovascular:      Rate and Rhythm: Normal rate and regular rhythm.     Heart sounds: Normal heart sounds. No murmur heard. Pulmonary:     Effort: Pulmonary effort is normal. No respiratory distress.     Breath sounds: Normal breath sounds. No wheezing.  Musculoskeletal:        General: No swelling. Normal range of motion.     Cervical back: Neck supple.  Lymphadenopathy:     Cervical: No cervical adenopathy.  Skin:    General: Skin is warm and dry.     Findings: No rash.  Neurological:     Mental Status: He is alert and oriented to person, place, and time.     Coordination: Coordination normal.  Psychiatric:        Behavior: Behavior normal.       Assessment & Plan:   Problem List Items Addressed This Visit       Cardiovascular and Mediastinum   Essential hypertension, benign - Primary   Relevant Orders   Bayer DCA Hb A1c Waived   CBC with Differential/Platelet   CMP14+EGFR   Lipid panel     Genitourinary   BPH (benign prostatic hyperplasia)     Other   Hyperlipidemia with target LDL less than 100   Relevant Orders   Bayer DCA Hb A1c Waived   CBC with Differential/Platelet   CMP14+EGFR   Lipid panel   Prediabetes   Relevant Orders   Bayer DCA Hb A1c Waived   CBC with Differential/Platelet   CMP14+EGFR   Lipid panel    Continue current medicine, will do blood work today. Follow up plan: Return in about 6 months (around 05/04/2023), or if symptoms worsen or fail to improve, for Prediabetes and hypertension and hyperlipidemia.  Counseling provided for all of the vaccine components Orders Placed This Encounter  Procedures   Bayer DCA Hb A1c Waived   CBC with Differential/Platelet   CMP14+EGFR   Lipid panel    Arville Care, MD [facility] 11/03/2022, 11:22 AM

## 2022-11-04 LAB — CMP14+EGFR
ALT: 12 IU/L (ref 0–44)
AST: 22 IU/L (ref 0–40)
Albumin: 3.8 g/dL (ref 3.7–4.7)
Alkaline Phosphatase: 94 IU/L (ref 44–121)
BUN/Creatinine Ratio: 22 (ref 10–24)
BUN: 16 mg/dL (ref 8–27)
Bilirubin Total: 0.5 mg/dL (ref 0.0–1.2)
CO2: 26 mmol/L (ref 20–29)
Calcium: 8.8 mg/dL (ref 8.6–10.2)
Chloride: 103 mmol/L (ref 96–106)
Creatinine, Ser: 0.73 mg/dL — ABNORMAL LOW (ref 0.76–1.27)
Globulin, Total: 2.2 g/dL (ref 1.5–4.5)
Glucose: 110 mg/dL — ABNORMAL HIGH (ref 70–99)
Potassium: 3.8 mmol/L (ref 3.5–5.2)
Sodium: 142 mmol/L (ref 134–144)
Total Protein: 6 g/dL (ref 6.0–8.5)
eGFR: 87 mL/min/{1.73_m2} (ref >59)

## 2022-11-04 LAB — CBC WITH DIFFERENTIAL/PLATELET
Basophils Absolute: 0.1 10*3/uL (ref 0.0–0.2)
Basos: 1 %
EOS (ABSOLUTE): 0.5 10*3/uL — ABNORMAL HIGH (ref 0.0–0.4)
Eos: 7 %
Hematocrit: 36.3 % — ABNORMAL LOW (ref 37.5–51.0)
Hemoglobin: 11.8 g/dL — ABNORMAL LOW (ref 13.0–17.7)
Immature Grans (Abs): 0.1 10*3/uL (ref 0.0–0.1)
Immature Granulocytes: 1 %
Lymphocytes Absolute: 1.7 10*3/uL (ref 0.7–3.1)
Lymphs: 26 %
MCH: 30.1 pg (ref 26.6–33.0)
MCHC: 32.5 g/dL (ref 31.5–35.7)
MCV: 93 fL (ref 79–97)
Monocytes Absolute: 0.6 10*3/uL (ref 0.1–0.9)
Monocytes: 9 %
Neutrophils Absolute: 3.6 10*3/uL (ref 1.4–7.0)
Neutrophils: 56 %
Platelets: 198 10*3/uL (ref 150–450)
RBC: 3.92 x10E6/uL — ABNORMAL LOW (ref 4.14–5.80)
RDW: 13.5 % (ref 11.6–15.4)
WBC: 6.5 10*3/uL (ref 3.4–10.8)

## 2022-11-04 LAB — LIPID PANEL
Cholesterol, Total: 98 mg/dL — ABNORMAL LOW (ref 100–199)
HDL: 46 mg/dL (ref >39)
LDL CALC COMMENT:: 2.1 ratio (ref 0.0–5.0)
LDL Chol Calc (NIH): 40 mg/dL (ref 0–99)
Triglycerides: 44 mg/dL (ref 0–149)
VLDL Cholesterol Cal: 12 mg/dL (ref 5–40)

## 2022-11-14 DIAGNOSIS — L308 Other specified dermatitis: Secondary | ICD-10-CM | POA: Diagnosis not present

## 2022-11-14 DIAGNOSIS — L218 Other seborrheic dermatitis: Secondary | ICD-10-CM | POA: Diagnosis not present

## 2023-01-07 ENCOUNTER — Other Ambulatory Visit: Payer: Self-pay | Admitting: Family Medicine

## 2023-01-07 DIAGNOSIS — I10 Essential (primary) hypertension: Secondary | ICD-10-CM

## 2023-01-30 ENCOUNTER — Other Ambulatory Visit: Payer: Self-pay | Admitting: Family Medicine

## 2023-01-30 DIAGNOSIS — I10 Essential (primary) hypertension: Secondary | ICD-10-CM

## 2023-03-15 ENCOUNTER — Encounter: Payer: Self-pay | Admitting: Family Medicine

## 2023-03-15 ENCOUNTER — Ambulatory Visit (INDEPENDENT_AMBULATORY_CARE_PROVIDER_SITE_OTHER): Payer: 59 | Admitting: Family Medicine

## 2023-03-15 VITALS — BP 134/70 | HR 72 | Ht 65.0 in | Wt 130.0 lb

## 2023-03-15 DIAGNOSIS — M109 Gout, unspecified: Secondary | ICD-10-CM

## 2023-03-15 NOTE — Progress Notes (Signed)
BP 134/70   Pulse 72   Ht 5\' 5"  (1.651 m)   Wt 130 lb (59 kg)   SpO2 96%   BMI 21.63 kg/m    Subjective:   Patient ID: Darren Rose, male    DOB: 1931-06-08, 89 y.o.   MRN: [id number]  HPI: Darren Rose is a 89 y.o. male presenting on 03/15/2023 for Wrist Pain (Right. Was red, swollen, painful. Denies injury and site has greatly improved since scheduling appt.)   HPI Right wrist pain and swelling Patient is coming in today with right wrist pain and swelling in his right wrist.  He he says it started about a week ago and calm down about 2 or 3 days ago.  He denies any fevers or chills.  He did take 1 or 2 doses of an antibiotic from his family member and that is calm down and he has not had any further.  He says he can move around just fine and does not have any pain in the right now.  Relevant past medical, surgical, family and social history reviewed and updated as indicated. Interim medical history since our last visit reviewed. Allergies and medications reviewed and updated.  Review of Systems  Constitutional:  Negative for chills and fever.  Eyes:  Negative for discharge.  Respiratory:  Negative for shortness of breath and wheezing.   Cardiovascular:  Negative for chest pain and leg swelling.  Musculoskeletal:  Positive for arthralgias and joint swelling. Negative for back pain and gait problem.  Skin:  Negative for rash.  All other systems reviewed and are negative.   Per HPI unless specifically indicated above   Allergies as of 03/15/2023   No Known Allergies      Medication List        Accurate as of March 15, 2023  4:14 PM. If you have any questions, ask your nurse or doctor.          alfuzosin 10 MG 24 hr tablet Commonly known as: UROXATRAL Take 1 tablet (10 mg total) by mouth daily.   alfuzosin 10 MG 24 hr tablet Commonly known as: UROXATRAL TAKE 1 TABLET BY MOUTH EVERY DAY   amLODipine 5 MG tablet Commonly known as: NORVASC TAKE 1  TABLET (5 MG TOTAL) BY MOUTH DAILY.   aspirin EC 81 MG tablet Take 81 mg by mouth daily.   atorvastatin 40 MG tablet Commonly known as: LIPITOR TAKE 1 TABLET BY MOUTH DAILY AT 6 PM.   clobetasol cream 0.05 % Commonly known as: TEMOVATE   diclofenac sodium 1 % Gel Commonly known as: VOLTAREN   finasteride 5 MG tablet Commonly known as: PROSCAR Take 1 tablet (5 mg total) by mouth daily.   ketoconazole 2 % cream Commonly known as: NIZORAL Apply topically.   lisinopril 40 MG tablet Commonly known as: ZESTRIL TAKE 1 TABLET BY MOUTH EVERY DAY   loratadine 10 MG tablet Commonly known as: CLARITIN Take 1 tablet (10 mg total) by mouth daily.   Myrbetriq 50 MG Tb24 tablet Generic drug: mirabegron ER Take 1 tablet (50 mg total) by mouth daily.         Objective:   BP 134/70   Pulse 72   Ht 5\' 5"  (1.651 m)   Wt 130 lb (59 kg)   SpO2 96%   BMI 21.63 kg/m   Wt Readings from Last 3 Encounters:  03/15/23 130 lb (59 kg)  07/14/22 127 lb (57.6 kg)  05/25/22 127 lb (57.6 kg)  Physical Exam Vitals and nursing note reviewed.  Constitutional:      Appearance: Normal appearance.  Musculoskeletal:     Right wrist: Normal. No swelling, deformity, effusion or tenderness. Normal range of motion.     Left wrist: Normal. No swelling, deformity, effusion or tenderness. Normal range of motion.  Neurological:     Mental Status: He is alert.       Assessment & Plan:   Problem List Items Addressed This Visit   None Visit Diagnoses       Acute gout of right wrist, unspecified cause    -  Primary   Relevant Orders   Uric acid     No pain or swelling or tenderness today, it does sound like he had gout and it is calm down, will test uric acid today.  Follow up plan: Return if symptoms worsen or fail to improve.  Counseling provided for all of the vaccine components Orders Placed This Encounter  Procedures   Uric acid    Arville Care, MD [facility]  [facility] 03/15/2023, 4:14 PM

## 2023-03-16 LAB — URIC ACID: Uric Acid: 5 mg/dL (ref 3.8–8.4)

## 2023-03-29 ENCOUNTER — Other Ambulatory Visit: Payer: Self-pay | Admitting: Urology

## 2023-04-24 DIAGNOSIS — H905 Unspecified sensorineural hearing loss: Secondary | ICD-10-CM | POA: Diagnosis not present

## 2023-05-04 ENCOUNTER — Encounter: Payer: Self-pay | Admitting: Family Medicine

## 2023-05-04 ENCOUNTER — Ambulatory Visit: Payer: 59 | Admitting: Family Medicine

## 2023-05-04 VITALS — BP 108/69 | HR 73 | Ht 65.0 in | Wt 132.0 lb

## 2023-05-04 DIAGNOSIS — I10 Essential (primary) hypertension: Secondary | ICD-10-CM

## 2023-05-04 DIAGNOSIS — E785 Hyperlipidemia, unspecified: Secondary | ICD-10-CM | POA: Diagnosis not present

## 2023-05-04 DIAGNOSIS — R7303 Prediabetes: Secondary | ICD-10-CM

## 2023-05-04 DIAGNOSIS — J301 Allergic rhinitis due to pollen: Secondary | ICD-10-CM

## 2023-05-04 DIAGNOSIS — N400 Enlarged prostate without lower urinary tract symptoms: Secondary | ICD-10-CM

## 2023-05-04 LAB — BAYER DCA HB A1C WAIVED: HB A1C (BAYER DCA - WAIVED): 5.8 % — ABNORMAL HIGH (ref 4.8–5.6)

## 2023-05-04 MED ORDER — LORATADINE 10 MG PO TABS: 10.0000 mg | ORAL_TABLET | Freq: Every day | ORAL | 3 refills | Status: AC

## 2023-05-04 NOTE — Progress Notes (Signed)
 BP 108/69   Pulse 73   Ht 5\' 5"  (1.651 m)   Wt 132 lb (59.9 kg)   SpO2 94%   BMI 21.97 kg/m    Subjective:   Patient ID: Darren Rose, male    DOB: [birth year], 89 y.o.   MRN: [id number]  HPI: Darren Rose is a 89 y.o. male presenting on 05/04/2023 for Medical Management of Chronic Issues and Prediabetes   HPI Hypertension Patient is currently on lisinopril and amlodipine, and their blood pressure today is 108/69. Patient denies any lightheadedness or dizziness. Patient denies headaches, blurred vision, chest pains, shortness of breath, or weakness. Denies any side effects from medication and is content with current medication.   Hyperlipidemia Patient is coming in for recheck of his hyperlipidemia. The patient is currently taking atorvastatin. They deny any issues with myalgias or history of liver damage from it. They deny any focal numbness or weakness or chest pain.   Prediabetes Patient comes in today for recheck of his diabetes. Patient has been currently taking no medicine currently, diet control. Patient is currently on an ACE inhibitor/ARB. Patient has not seen an ophthalmologist this year. Patient denies any new issues with their feet. The symptom started onset as an adult hypertension and hyperlipidemia ARE RELATED TO DM   BPH Patient is coming in for recheck on BPH Symptoms: None currently Medication: Alfuzosin and finasteride Last PSA: 2023, will check this time   Relevant past medical, surgical, family and social history reviewed and updated as indicated. Interim medical history since our last visit reviewed. Allergies and medications reviewed and updated.  Review of Systems  Constitutional:  Negative for chills and fever.  Eyes:  Negative for visual disturbance.  Respiratory:  Negative for shortness of breath and wheezing.   Cardiovascular:  Negative for chest pain and leg swelling.  Musculoskeletal:  Positive for arthralgias. Negative for back pain and  gait problem.  Skin:  Negative for rash.  All other systems reviewed and are negative.   Per HPI unless specifically indicated above   Allergies as of 05/04/2023   No Known Allergies      Medication List        Accurate as of May 04, 2023 11:07 AM. If you have any questions, ask your nurse or doctor.          alfuzosin 10 MG 24 hr tablet Commonly known as: UROXATRAL Take 1 tablet (10 mg total) by mouth daily.   alfuzosin 10 MG 24 hr tablet Commonly known as: UROXATRAL TAKE 1 TABLET BY MOUTH EVERY DAY   amLODipine 5 MG tablet Commonly known as: NORVASC TAKE 1 TABLET (5 MG TOTAL) BY MOUTH DAILY.   aspirin EC 81 MG tablet Take 81 mg by mouth daily.   atorvastatin 40 MG tablet Commonly known as: LIPITOR TAKE 1 TABLET BY MOUTH DAILY AT 6 PM.   clobetasol cream 0.05 % Commonly known as: TEMOVATE   diclofenac sodium 1 % Gel Commonly known as: VOLTAREN   finasteride 5 MG tablet Commonly known as: PROSCAR Take 1 tablet (5 mg total) by mouth daily.   ketoconazole 2 % cream Commonly known as: NIZORAL Apply topically.   lisinopril 40 MG tablet Commonly known as: ZESTRIL TAKE 1 TABLET BY MOUTH EVERY DAY   loratadine 10 MG tablet Commonly known as: CLARITIN Take 1 tablet (10 mg total) by mouth daily.   Myrbetriq 50 MG Tb24 tablet Generic drug: mirabegron ER Take 1 tablet (50 mg total) by mouth daily.  Objective:   BP 108/69   Pulse 73   Ht 5\' 5"  (1.651 m)   Wt 132 lb (59.9 kg)   SpO2 94%   BMI 21.97 kg/m   Wt Readings from Last 3 Encounters:  05/04/23 132 lb (59.9 kg)  03/15/23 130 lb (59 kg)  07/14/22 127 lb (57.6 kg)    Physical Exam Vitals and nursing note reviewed.  Constitutional:      General: He is not in acute distress.    Appearance: He is well-developed. He is not diaphoretic.  Eyes:     General: No scleral icterus.    Conjunctiva/sclera: Conjunctivae normal.  Neck:     Thyroid: No thyromegaly.  Cardiovascular:      Rate and Rhythm: Normal rate and regular rhythm.     Heart sounds: Normal heart sounds. No murmur heard. Pulmonary:     Effort: Pulmonary effort is normal. No respiratory distress.     Breath sounds: Normal breath sounds. No wheezing.  Musculoskeletal:        General: Normal range of motion.     Cervical back: Neck supple.  Lymphadenopathy:     Cervical: No cervical adenopathy.  Skin:    General: Skin is warm and dry.     Findings: No rash.  Neurological:     Mental Status: He is alert and oriented to person, place, and time.     Coordination: Coordination normal.  Psychiatric:        Behavior: Behavior normal.       Assessment & Plan:   Problem List Items Addressed This Visit       Cardiovascular and Mediastinum   Essential hypertension, benign - Primary   Relevant Orders   CBC with Differential/Platelet   CMP14+EGFR   Lipid panel     Genitourinary   BPH (benign prostatic hyperplasia)   Relevant Orders   PSA, total and free     Other   Hyperlipidemia with target LDL less than 100   Relevant Orders   CBC with Differential/Platelet   CMP14+EGFR   Lipid panel   Prediabetes   Relevant Orders   Bayer DCA Hb A1c Waived   Other Visit Diagnoses       Seasonal allergic rhinitis due to pollen       Relevant Medications   loratadine (CLARITIN) 10 MG tablet     Blood work is pending.  Seems like he is doing well.  No change in medication.  Blood pressure and everything else looks good.  Follow up plan: Return in about 6 months (around 11/03/2023), or if symptoms worsen or fail to improve, for Hypertension and prediabetes .  Counseling provided for all of the vaccine components Orders Placed This Encounter  Procedures   Bayer DCA Hb A1c Waived   CBC with Differential/Platelet   CMP14+EGFR   Lipid panel   PSA, total and free    Arville Care, MD [facility] 05/04/2023, 11:07 AM

## 2023-05-05 LAB — CBC WITH DIFFERENTIAL/PLATELET
Basophils Absolute: 0.1 10*3/uL (ref 0.0–0.2)
Basos: 1 %
EOS (ABSOLUTE): 0.4 10*3/uL (ref 0.0–0.4)
Eos: 5 %
Hematocrit: 32.4 % — ABNORMAL LOW (ref 37.5–51.0)
Hemoglobin: 10.9 g/dL — ABNORMAL LOW (ref 13.0–17.7)
Immature Grans (Abs): 0 10*3/uL (ref 0.0–0.1)
Immature Granulocytes: 0 %
Lymphocytes Absolute: 1.8 10*3/uL (ref 0.7–3.1)
Lymphs: 26 %
MCH: 30.2 pg (ref 26.6–33.0)
MCHC: 33.6 g/dL (ref 31.5–35.7)
MCV: 90 fL (ref 79–97)
Monocytes Absolute: 0.6 10*3/uL (ref 0.1–0.9)
Monocytes: 8 %
Neutrophils Absolute: 4.4 10*3/uL (ref 1.4–7.0)
Neutrophils: 60 %
Platelets: 213 10*3/uL (ref 150–450)
RBC: 3.61 x10E6/uL — ABNORMAL LOW (ref 4.14–5.80)
RDW: 12.7 % (ref 11.6–15.4)
WBC: 7.2 10*3/uL (ref 3.4–10.8)

## 2023-05-05 LAB — PSA, TOTAL AND FREE
PSA, Free Pct: 16.7 %
PSA, Free: 0.05 ng/mL
Prostate Specific Ag, Serum: 0.3 ng/mL (ref 0.0–4.0)

## 2023-05-05 LAB — LIPID PANEL
Chol/HDL Ratio: 2.2 ratio (ref 0.0–5.0)
Cholesterol, Total: 97 mg/dL — ABNORMAL LOW (ref 100–199)
HDL: 45 mg/dL (ref >39)
LDL Chol Calc (NIH): 40 mg/dL (ref 0–99)
Triglycerides: 48 mg/dL (ref 0–149)
VLDL Cholesterol Cal: 12 mg/dL (ref 5–40)

## 2023-05-05 LAB — CMP14+EGFR
ALT: 17 IU/L (ref 0–44)
AST: 24 IU/L (ref 0–40)
Albumin: 3.7 g/dL (ref 3.6–4.6)
Alkaline Phosphatase: 93 IU/L (ref 44–121)
BUN/Creatinine Ratio: 22 (ref 10–24)
BUN: 15 mg/dL (ref 10–36)
Bilirubin Total: 0.5 mg/dL (ref 0.0–1.2)
CO2: 22 mmol/L (ref 20–29)
Calcium: 9.1 mg/dL (ref 8.6–10.2)
Chloride: 102 mmol/L (ref 96–106)
Creatinine, Ser: 0.67 mg/dL — ABNORMAL LOW (ref 0.76–1.27)
Globulin, Total: 2.5 g/dL (ref 1.5–4.5)
Glucose: 95 mg/dL (ref 70–99)
Potassium: 3.8 mmol/L (ref 3.5–5.2)
Sodium: 139 mmol/L (ref 134–144)
Total Protein: 6.2 g/dL (ref 6.0–8.5)
eGFR: 89 mL/min/{1.73_m2} (ref >59)

## 2023-05-14 MED ORDER — IRON (FERROUS SULFATE) 325 (65 FE) MG PO TABS: 325.0000 mg | ORAL_TABLET | Freq: Every day | ORAL | 1 refills | Status: DC

## 2023-05-14 NOTE — Addendum Note (Signed)
 Addended by: Wynelle Heather on: 05/14/2023 08:17 AM   Modules accepted: Orders

## 2023-07-04 ENCOUNTER — Other Ambulatory Visit: Payer: Self-pay | Admitting: Urology

## 2023-07-04 ENCOUNTER — Other Ambulatory Visit: Payer: Self-pay | Admitting: Family Medicine

## 2023-07-04 DIAGNOSIS — I10 Essential (primary) hypertension: Secondary | ICD-10-CM

## 2023-07-04 DIAGNOSIS — N400 Enlarged prostate without lower urinary tract symptoms: Secondary | ICD-10-CM

## 2023-07-16 ENCOUNTER — Ambulatory Visit (INDEPENDENT_AMBULATORY_CARE_PROVIDER_SITE_OTHER): Payer: 59

## 2023-07-16 VITALS — BP 108/69 | HR 73 | Ht 65.0 in | Wt 132.0 lb

## 2023-07-16 DIAGNOSIS — Z0000 Encounter for general adult medical examination without abnormal findings: Secondary | ICD-10-CM | POA: Diagnosis not present

## 2023-07-16 NOTE — Patient Instructions (Signed)
 Darren Rose , Thank you for taking time out of your busy schedule to complete your Annual Wellness Visit with me. I enjoyed our conversation and look forward to speaking with you again next year. I, as well as your care team,  appreciate your ongoing commitment to your health goals. Please review the following plan we discussed and let me know if I can assist you in the future. Your Game plan/ To Do List     Follow up Visits: Next Medicare AWV with our clinical staff: 07/16/24 at 3:50p.m.   Next Office Visit with your provider: 11/05/23 at 10:40a.m.  Clinician Recommendations:  Aim for 30 minutes of exercise or brisk walking, 6-8 glasses of water, and 5 servings of fruits and vegetables each day.       This is a list of the screening recommended for you and due dates:  Health Maintenance  Topic Date Due   COVID-19 Vaccine (4 - 2024-25 season) 07/31/2024*   Flu Shot  08/31/2023   Medicare Annual Wellness Visit  07/15/2024   DTaP/Tdap/Td vaccine (3 - Td or Tdap) 09/30/2029   Pneumococcal Vaccine for age over 49  Completed   Zoster (Shingles) Vaccine  Completed   HPV Vaccine  Aged Out   Meningitis B Vaccine  Aged Out  *Topic was postponed. The date shown is not the original due date.    Advanced directives: (Declined) Advance directive discussed with you today. Even though you declined this today, please call our office should you change your mind, and we can give you the proper paperwork for you to fill out. Advance Care Planning is important because it:  [x]  Makes sure you receive the medical care that is consistent with your values, goals, and preferences  [x]  It provides guidance to your family and loved ones and reduces their decisional burden about whether or not they are making the right decisions based on your wishes.  Follow the link provided in your after visit summary or read over the paperwork we have mailed to you to help you started getting your Advance Directives in place.  If you need assistance in completing these, please reach out to us  so that we can help you!  See attachments for Preventive Care and Fall Prevention Tips.
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
## 2023-07-23 ENCOUNTER — Encounter: Payer: Self-pay | Admitting: Urology

## 2023-07-23 ENCOUNTER — Ambulatory Visit: Payer: 59 | Admitting: Urology

## 2023-07-23 VITALS — BP 112/58 | HR 86

## 2023-07-23 DIAGNOSIS — N400 Enlarged prostate without lower urinary tract symptoms: Secondary | ICD-10-CM | POA: Diagnosis not present

## 2023-07-23 DIAGNOSIS — N3281 Overactive bladder: Secondary | ICD-10-CM

## 2023-07-23 DIAGNOSIS — R350 Frequency of micturition: Secondary | ICD-10-CM

## 2023-07-23 LAB — URINALYSIS, ROUTINE W REFLEX MICROSCOPIC
Bilirubin, UA: NEGATIVE
Glucose, UA: NEGATIVE
Ketones, UA: NEGATIVE
Leukocytes,UA: NEGATIVE
Nitrite, UA: NEGATIVE
Protein,UA: NEGATIVE
RBC, UA: NEGATIVE
Specific Gravity, UA: <1.005 — ABNORMAL LOW (ref 1.005–1.030)
Urobilinogen, Ur: 0.2 mg/dL (ref 0.2–1.0)
pH, UA: 6.5 (ref 5.0–7.5)

## 2023-07-23 MED ORDER — FINASTERIDE 5 MG PO TABS: 5.0000 mg | ORAL_TABLET | Freq: Every day | ORAL | 3 refills | Status: AC

## 2023-07-23 MED ORDER — MYRBETRIQ 50 MG PO TB24: 50.0000 mg | ORAL_TABLET | Freq: Every day | ORAL | 3 refills | Status: AC

## 2023-07-23 MED ORDER — ALFUZOSIN HCL ER 10 MG PO TB24: 10.0000 mg | ORAL_TABLET | Freq: Every day | ORAL | 3 refills | Status: AC
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
## 2023-07-23 NOTE — Patient Instructions (Signed)
 Hiperplasia prosttica benigna Benign Prostatic Hyperplasia  La hiperplasia prosttica benigna (HBP) es un aumento del tamao de la prstata que es causado por el proceso normal de envejecimiento. La prstata puede aumentar de tamao a medida que un hombre envejece. Esta afeccin no es causada por Management consultant. La prstata es una glndula del tamao de una nuez que participa en la produccin de semen. Est ubicada frente al recto y debajo de la vejiga. La vejiga almacena la orina. La uretra lleva la orina hacia fuera del cuerpo. Una prstata agrandada puede presionar la uretra. Esto puede dificultar el pasaje de la orina. La acumulacin de orina en la vejiga puede causar una infeccin. La presin y la infeccin pueden provocar daos en la vejiga y una insuficiencia en los riones (renal). Cules son las causas? Esta afeccin es parte del proceso normal de envejecimiento. Sin embargo, no todos los hombres desarrollan problemas por esta afeccin. Si la prstata se agranda lejos de la uretra, el flujo de orina no se obstruir. Si se agranda hacia la uretra y la comprime, habr problemas con el paso de la [place]. Qu incrementa el riesgo? Es ms probable que Copy se desarrolle en los hombres mayores de 50 aos. Cules son los signos o sntomas? Los sntomas de esta afeccin incluyen: Levantarse con frecuencia durante la noche para orinar. Necesidad de Geographical information systems officer con ms frecuencia Administrator. Dificultad para comenzar a eliminar la orina. Disminucin del tamao y de la fuerza del chorro de [place]. Prdida (goteo) luego de Geographical information systems officer. Imposibilidad para orinar. En este caso, es necesario un tratamiento inmediato. Imposibilidad para vaciar la vejiga completamente. Dolor al Beatrix Shipper. Esto es ms comn si tambin hay una infeccin. Infeccin de las vas urinarias (IU). Cmo se diagnostica? Esta afeccin se diagnostica en funcin de los antecedentes mdicos, un examen fsico y los sntomas. Tambin se  le realizarn pruebas, por ejemplo: Un estudio despus de vaciar la vejiga. Este mide la cantidad de orina que queda en la vejiga despus de terminar de Geographical information systems officer. Examen rectal digital. En un examen rectal, el mdico controla la prstata colocando un dedo lubricado y enguantado en el recto para sentir la parte posterior de la prstata. Este examen detecta el tamao de la glndula y si hay algn bulto o crecimiento anormal. Anlisis de orina (urinlisis). Estudio de antgeno prosttico especfico (PSA). Este es un anlisis de sangre que se [place] para Arts administrator de prstata. Una ecografa. En [employer], se utilizan ondas de sonido para producir de Careers information officer una imagen de la prstata. El mdico puede derivarlo a Music therapist en enfermedades del rin y de la prstata (urlogo). Cmo se trata? Una vez que los sntomas comienzan, el mdico controlar la afeccin (vigilancia activa u observacin cautelosa). El tratamiento depender de la gravedad de la afeccin. El tratamiento puede incluir: Observacin y exmenes anuales. Este puede ser el nico tratamiento necesario si la afeccin y los sntomas son leves. Medicamentos para [employer], entre los que se incluyen los siguientes: Medicamentos para IT trainer. Medicamentos para relajar el msculo de la prstata. Kandis Ban, solo [employer] son graves. La ciruga puede incluir lo siguiente: Prostatectoma. En este procedimiento, se extrae el tejido de la prstata completamente, a travs de una incisin [place], con un laparoscopio o con robtica. Reseccin transuretral de la prstata (RTUP). En este procedimiento, se inserta una herramienta a travs de la abertura en la punta del pene (uretra). Se utiliza para cortar tejido del centro interior de la  prstata. Los trozos se retiran a Games developer de la misma abertura del pene. De este modo, se libera la obstruccin. Incisin transuretral (ITUP). En este  procedimiento, se hacen pequeos cortes en la prstata. Esto alivia la presin de la prstata sobre la uretra. Termoterapia transuretral con microondas (TTUM). En este procedimiento, se utilizan microondas para Facilities manager. El calor destruye y extirpa una pequea cantidad de tejido prosttico. Ablacin transuretral con aguja (ATUA). En este procedimiento, se utiliza la radiofrecuencia para destruir y extirpar una pequea cantidad de tejido prosttico. Coagulacin intersticial con lser (CIL). En este procedimiento, se utiliza un lser para destruir y extirpar una pequea cantidad de tejido prosttico. Electrovaporizacin transuretral (EVTU). En este procedimiento, se utilizan electrodos para destruir y extirpar una pequea cantidad de tejido prosttico. Liberacin uretral prosttica. En este procedimiento, se inserta un implante para ejercer presin en los lbulos de la prstata, en direccin contraria a la uretra. Siga estas instrucciones en su casa: Use los medicamentos de venta libre y los recetados solamente como se lo haya indicado el mdico. Controle si hay cambios en los sntomas. Hable con su mdico antes de hacer cualquier cambio. Evite beber grandes cantidades de lquido antes de irse a la cama o de salir de su casa. Evite o reduzca la cantidad de cafena o alcohol que consume. Tmese tiempo para orinar. Concurra a todas las visitas de seguimiento. Esto es importante. Comunquese con un mdico si: Siente dolor en la espalda sin explicacin. Los sntomas no mejoran con Scientist, research (medical). Los [employer] causan efectos secundarios. La orina se vuelve muy oscura o tiene mal olor. Siente que la parte inferior del abdomen est distendida y tiene problemas para Geographical information systems officer. Solicite ayuda de inmediato si: Tiene fiebre o escalofros. De repente no [employer]. Se siente aturdido o [employer], o se [place]. Observa gran cantidad de sangre o cogulos en la orina. Sus problemas urinarios se  vuelven difciles de Chief Operating Officer. Siente dolor moderado a intenso en la espalda o en la fosa lumbar. La fosa lumbar es el costado del cuerpo entre las costillas y la cadera. Estos sntomas pueden Customer service manager. Solicite ayuda de inmediato. Llame al 911. No espere a ver si los sntomas desaparecen. No conduzca por sus propios medios [employer] hospital. Resumen La hiperplasia prosttica benigna (HBP) es un aumento del tamao de la prstata que es causado por el proceso normal de envejecimiento. No es causada por Management consultant. Una prstata agrandada puede presionar la uretra. Esto puede dificultar el paso de la [place]. Es ms probable que Copy se desarrolle en los hombres mayores de 50 aos. Obtenga atencin de inmediato si de repente no puede orinar. Esta informacin no tiene Theme park manager el consejo del mdico. Asegrese de hacerle al mdico cualquier pregunta que tenga. Document Revised: 08/24/2020 Document Reviewed: 08/24/2020 Elsevier Patient Education  2024 [employer].

## 2023-08-09 ENCOUNTER — Other Ambulatory Visit: Payer: Self-pay | Admitting: Family Medicine

## 2023-08-09 DIAGNOSIS — I10 Essential (primary) hypertension: Secondary | ICD-10-CM

## 2023-09-11 ENCOUNTER — Ambulatory Visit: Payer: Self-pay

## 2023-09-11 NOTE — Telephone Encounter (Signed)
 FYI Only or Action Required?: FYI only for provider.  Patient was last seen in primary care on 05/04/2023 by Dettinger, Fonda LABOR, MD.  Called Nurse Triage reporting Fall.  Symptoms began several days ago.  Interventions attempted: Rest, hydration, or home remedies.  Symptoms are: unchanged.  Triage Disposition: See PCP When Office is Open (Within 3 Days)  Patient/caregiver understands and will follow disposition?:   Copied from CRM [id number]. Topic: Clinical - Red Word Triage >> Sep 11, 2023  4:06 PM [place] J wrote: [facility] that prompted transfer to Nurse Triage: Patient had a fall and now his leg is red and swollen. ----------------------------------------------------------------------- From previous Reason for Contact - Scheduling: Patient/patient representative is calling to schedule an appointment. Refer to attachments for appointment information. Reason for Disposition  MILD weakness (e.g., does not interfere with ability to work, go to school, normal activities)  (Exception: Mild weakness is a chronic symptom.)  Answer Assessment - Initial Assessment Questions 1. MECHANISM: How did the fall happen?     Patient was outside to get the mail and daughter believes he tripped and hurt his leg 2. DOMESTIC VIOLENCE AND ELDER ABUSE SCREENING: Did you fall because someone pushed you or tried to hurt you? If Yes, ask: Are you safe now?     no 3. ONSET: When did the fall happen? (e.g., minutes, hours, or days ago)     Daughter is unsure of when it happened.  4. LOCATION: What part of the body hit the ground? (e.g., back, buttocks, head, hips, knees, hands, head, stomach)     Leg hit the ground 5. INJURY: Did you hurt (injure) yourself when you fell? If Yes, ask: What did you injure? Tell me more about this? (e.g., body area; type of injury; pain severity)     Right leg is red and swollen 6. PAIN: Is there any pain? If Yes, ask: How bad is the pain? (e.g., Scale 0-10; or  none, mild,      No pain per patient 7. SIZE: For cuts, bruises, or swelling, ask: How large is it? (e.g., inches or centimeters)      Medium sized area of right lower leg that is red and swollen 9. OTHER SYMPTOMS: Do you have any other symptoms? (e.g., dizziness, fever, weakness; new-onset or worsening).      no 10. CAUSE: What do you think caused the fall (or falling)? (e.g., dizzy spell, tripped)       Tripped  Daughter called wanting to get him checked out due to the fall he had. Redness and swelling to right leg.  Protocols used: Falls and [facility]

## 2023-09-11 NOTE — Telephone Encounter (Signed)
 APPT MADE

## 2023-09-12 ENCOUNTER — Encounter: Payer: Self-pay | Admitting: Nurse Practitioner

## 2023-09-12 ENCOUNTER — Ambulatory Visit: Admitting: Nurse Practitioner

## 2023-09-12 VITALS — BP 136/78 | HR 66 | Temp 97.0°F | Ht 65.0 in | Wt 125.0 lb

## 2023-09-12 DIAGNOSIS — S81802A Unspecified open wound, left lower leg, initial encounter: Secondary | ICD-10-CM | POA: Insufficient documentation

## 2023-09-12 DIAGNOSIS — L03116 Cellulitis of left lower limb: Secondary | ICD-10-CM | POA: Insufficient documentation

## 2023-09-12 MED ORDER — SULFAMETHOXAZOLE-TRIMETHOPRIM 800-160 MG PO TABS: 1.0000 | ORAL_TABLET | Freq: Two times a day (BID) | ORAL | 0 refills | Status: AC

## 2023-09-12 NOTE — Progress Notes (Signed)
 Acute Office Visit  Subjective:     Patient ID: Darren Rose, male    DOB: 1931-03-07, 89 y.o.   MRN: [id number]  Chief Complaint  Patient presents with   Fall    Has fell 3 times in last month but last fall 1 week ago scraped left leg looks infected now     HPI Darren Rose is a 89 year old male presenting for an acute visit on 09/12/2023. His son served as the interpreter. Per son he fell 3 times within the last month, and injured his leg with the last oneThe patient reports a fall resulting in injury to his left leg.Denies LOC or hitting head. Since the fall, he has developed redness and swelling concerning for cellulitis and possible infection. He denies any systemic symptoms such as fever, chills, or malaise.  Active Ambulatory Problems    Diagnosis Date Noted   Essential hypertension, benign 07/23/2012   BPH (benign prostatic hyperplasia) 07/23/2012   Cervicalgia 07/23/2012   Hyperlipidemia with target LDL less than 100 07/23/2012   Prediabetes 05/25/2016   OAB (overactive bladder) 03/10/2019   Pain of left hand 11/16/2020   Wound of left leg 09/12/2023   Cellulitis of left lower extremity 09/12/2023   Resolved Ambulatory Problems    Diagnosis Date Noted   Hyperglycemia 06/15/2016   Dyslipidemia 06/15/2016   Past Medical History:  Diagnosis Date   BPH (benign prostatic hypertrophy)    Hyperlipidemia    Hypertension     Review of Systems  Constitutional:  Negative for chills and fever.  Respiratory:  Negative for cough, shortness of breath and wheezing.   Cardiovascular:  Negative for chest pain and leg swelling.  Musculoskeletal:  Positive for falls.       Left leg erythema  Skin:  Negative for itching and rash.  Neurological:  Negative for dizziness and headaches.   Negative unless indicated in HPI    Objective:    BP 136/78   Pulse 66   Temp (!) 97 F (36.1 C) (Temporal)   Ht 5' 5 (1.651 m)   Wt 125 lb (56.7 kg)   SpO2 98%   BMI 20.80  kg/m  BP Readings from Last 3 Encounters:  09/12/23 136/78  07/23/23 (!) 112/58  07/16/23 108/69   Wt Readings from Last 3 Encounters:  09/12/23 125 lb (56.7 kg)  07/16/23 132 lb (59.9 kg)  05/04/23 132 lb (59.9 kg)      Physical Exam  General: Alert, in no acute distress Skin/Extremities: Left leg with localized erythema, mild swelling, warmth; no open wounds or drainage noted (culture may be obtained) Musculoskeletal: Tenderness over left leg; range of motion limited due to pain Neuro: Grossly intact; patient ambulates with assistance   Pertinent labs & imaging results that were available during my care of the patient were reviewed by me and considered in my medical decision making.  No results found for any visits on 09/12/23.      Assessment & Plan:  Cellulitis of left lower extremity -     Sulfamethoxazole -Trimethoprim ; Take 1 tablet by mouth 2 (two) times daily for 7 days.  Dispense: 14 tablet; Refill: 0   Darren Rose is a 89 yrs old Hispanic male seen today fro cellulitis, no acute distress Left lower extremity cellulitis following recent fall, without systemic symptoms Fall risk secondary to age -Start empiric oral antibiotics for cellulitis Bactrim  BID for 7-10 days. -Elevate leg and apply warm compresses as tolerated. -Monitor for systemic signs (fever, chills, spreading redness);  instruct caregiver to seek urgent care if they occur.  -Pain management with acetaminophen as needed. -Evaluate mobility and fall risk;  Future: May consider referral to physical therapy or home safety evaluation. Return if symptoms worsen or fail to improve.  Saloni Lablanc [place] Thompson, DNP [facility] [address] [place], [place] [address] [phone]  Note: This document was prepared by [employer] voice dictation technology and any errors that results from this process are unintentional.

## 2023-09-24 ENCOUNTER — Ambulatory Visit: Payer: Self-pay

## 2023-09-24 NOTE — Telephone Encounter (Signed)
 Pt has appt tomorrow

## 2023-09-24 NOTE — Telephone Encounter (Signed)
 FYI Only or Action Required?: FYI only for provider.  Patient was last seen in primary care on 09/12/2023 by Deitra Morton Sebastian Nena, NP.  Called Nurse Triage reporting Leg Swelling.  Symptoms began 2 weeks ago.  Interventions attempted: Prescription medications: Bactrim .  Symptoms are: gradually worsening.  Triage Disposition: See PCP When Office is Open (Within 3 Days)  Patient/caregiver understands and will follow disposition?: Yes         Copied from CRM #[id number]. Topic: Clinical - Red Word Triage >> Sep 24, 2023 10:55 AM Delon DASEN wrote: Red Word that prompted transfer to Nurse Triage: left leg, red, swollen and warm to touch- fell two weeks ago         Reason for Disposition  [1] Finished taking antibiotics AND [2] symptoms are BETTER but [3] not completely gone  Answer Assessment - Initial Assessment Questions 1. INFECTION: What infection is the antibiotic being given for?     Cellulitis  2. ANTIBIOTIC: What antibiotic are you taking How many times per day?     Bactrim   3. DURATION: When was the antibiotic started?     8/13 4. MAIN CONCERN OR SYMPTOM:  What is your main concern right now?     Redness and warmth returning  5. BETTER-SAME-WORSE: Are you getting better, staying the same, or getting worse compared to when you first started the antibiotics? If getting worse, ask: In what way?      Better but now returning  6. FEVER: Do you have a fever? If Yes, ask: What is your temperature, how was it measured, and when did it start?     No 7. SYMPTOMS: Are there any other symptoms you're concerned about? If Yes, ask: When did it start?     No 8. FOLLOW-UP APPOINTMENT: Do you have a follow-up appointment with your doctor?     No  Protocols used: Infection on Antibiotic Follow-up Call-A-AH

## 2023-09-25 ENCOUNTER — Encounter: Payer: Self-pay | Admitting: Nurse Practitioner

## 2023-09-25 ENCOUNTER — Ambulatory Visit (INDEPENDENT_AMBULATORY_CARE_PROVIDER_SITE_OTHER): Admitting: Nurse Practitioner

## 2023-09-25 VITALS — BP 107/60 | HR 60 | Temp 98.3°F | Ht 65.0 in | Wt 125.0 lb

## 2023-09-25 DIAGNOSIS — L03116 Cellulitis of left lower limb: Secondary | ICD-10-CM

## 2023-09-25 MED ORDER — DOXYCYCLINE HYCLATE 100 MG PO TABS: 100.0000 mg | ORAL_TABLET | Freq: Two times a day (BID) | ORAL | 0 refills | Status: DC

## 2023-09-25 NOTE — Progress Notes (Signed)
   Subjective:    Patient ID: Darren Rose, male    DOB: [birth year], 89 y.o.   MRN: [id number]   Chief Complaint: Edema (Recent dx cellulitis)   HPI  Patient was seen on 09/15/23 by [facility]. Thomas<FNP. He had sores on thefront of left leg and was dx with cellulitis. He was placed on bactrim  BID and has completed that course of meds. Today his main concern is the knot at the top of wound. Redness has improved but area still feels warm to touch. Patient Active Problem List   Diagnosis Date Noted   Wound of left leg 09/12/2023   Cellulitis of left lower extremity 09/12/2023   Pain of left hand 11/16/2020   OAB (overactive bladder) 03/10/2019   Prediabetes 05/25/2016   Essential hypertension, benign 07/23/2012   BPH (benign prostatic hyperplasia) 07/23/2012   Cervicalgia 07/23/2012   Hyperlipidemia with target LDL less than 100 07/23/2012       Review of Systems  Constitutional:  Negative for diaphoresis.  Eyes:  Negative for pain.  Respiratory:  Negative for shortness of breath.   Cardiovascular:  Negative for chest pain, palpitations and leg swelling.  Gastrointestinal:  Negative for abdominal pain.  Endocrine: Negative for polydipsia.  Skin:  Negative for rash.  Neurological:  Negative for dizziness, weakness and headaches.  Hematological:  Does not bruise/bleed easily.  All other systems reviewed and are negative.      Objective:   Physical Exam Constitutional:      Appearance: Normal appearance.  Cardiovascular:     Rate and Rhythm: Normal rate and regular rhythm.     Heart sounds: Normal heart sounds.  Pulmonary:     Breath sounds: Normal breath sounds.  Skin:    General: Skin is warm.     Comments: 8cm contusion at top of wound on left shin area. Minimal erythema with slight heat to touch.  Neurological:     General: No focal deficit present.     Mental Status: He is alert and oriented to person, place, and time.  Psychiatric:        Mood and Affect: Mood  normal.        Behavior: Behavior normal.    BP 107/60   Pulse 60   Temp 98.3 F (36.8 C)   Ht 5' 5 (1.651 m)   Wt 125 lb (56.7 kg)   SpO2 98%   BMI 20.80 kg/m         Assessment & Plan:   Darren Rose in today with chief complaint of Edema (Recent dx cellulitis)   1. Cellulitis of left lower extremity (Primary) Elevate leg when sitting Ice swollen area RTO prn - doxycycline  (VIBRA -TABS) 100 MG tablet; Take 1 tablet (100 mg total) by mouth 2 (two) times daily.  Dispense: 20 tablet; Refill: 0    The above assessment and management plan was discussed with the patient. The patient verbalized understanding of and has agreed to the management plan. Patient is aware to call the clinic if symptoms persist or worsen. Patient is aware when to return to the clinic for a follow-up visit. Patient educated on when it is appropriate to go to the emergency department.   Mary-Margaret Gladis, FNP

## 2023-10-02 ENCOUNTER — Other Ambulatory Visit: Payer: Self-pay | Admitting: Family Medicine

## 2023-10-03 ENCOUNTER — Ambulatory Visit: Payer: Self-pay

## 2023-10-03 NOTE — Telephone Encounter (Signed)
 Apt scheduled.

## 2023-10-03 NOTE — Telephone Encounter (Signed)
 FYI Only or Action Required?: FYI only for provider.  Patient was last seen in primary care on 09/25/2023 by [name], FNP.  Called Nurse Triage reporting Leg Swelling, Fall, Mass, and Pain.  Symptoms began about a month ago.  Interventions attempted: Other: PCP visits.  Symptoms are: some improving, persisting.  Triage Disposition: See HCP Within 4 Hours (Or PCP Triage)  Patient/caregiver understands and will follow disposition?: No, refuses disposition     Copied from CRM [id number]. Topic: Clinical - Red Word Triage >> Oct 03, 2023  3:34 PM Lauren C wrote: Red Word that prompted transfer to Nurse Triage: left lower leg swelling following a fall that happened about a month ago. Delon Najjar from [employer] from [facility] on the line. Reason for Disposition  [1] Thigh, calf, or ankle swelling AND [2] only 1 side  Answer Assessment - Initial Assessment Questions Had a fall a month ago, been examined twice since, x-ray not been done  1. ONSET: When did the swelling start? (e.g., minutes, hours, days)     Just after the fall a month ago, injured leg, landed on leg when he fell 2. LOCATION: What part of the leg is swollen?  Are both legs swollen or just one leg?     Left lower leg from knee down to ankle 3. SEVERITY: How bad is the swelling? (e.g., localized; mild, moderate, severe)     pitting edema 1+ 4. REDNESS: Is there redness or signs of infection?     No redness, warmth, or sores to area 5. PAIN: Is the swelling painful to touch? If Yes, ask: How painful is it?   (Scale 1-10; mild, moderate or severe)     Firm knot not like soft hematoma, to anterior lower leg just below the knee, tender to touch, mild, pain at the knot mostly Still able to walk 6. FEVER: Do you have a fever? If Yes, ask: What is it, how was it measured, and when did it start?      no 8. MEDICAL HISTORY: Do you have a history of blood clots (e.g., DVT), cancer,  heart failure, kidney disease, or liver failure?     no 9. RECURRENT SYMPTOM: Have you had leg swelling before? If Yes, ask: When was the last time? What happened that time?     no 10. OTHER SYMPTOMS: Do you have any other symptoms? (e.g., chest pain, difficulty breathing)       Seen in office 8/26 for cellulitis, no sores now Swelling is somewhat better just persistent No chest pain, SOB No blood thinners Whether hit head or chest or other injury with fall - not that was reported to me    Advised pt be examined in next 4 hours, Delon reported that pt wife may be more acute, so Delon chose to schedule appt for him next day due to no further availability today with office. Advised call back or seek immediate care for worsening symptoms.  Protocols used: Leg Swelling and Edema-A-AH

## 2023-10-04 ENCOUNTER — Ambulatory Visit (INDEPENDENT_AMBULATORY_CARE_PROVIDER_SITE_OTHER): Admitting: Family

## 2023-10-04 ENCOUNTER — Ambulatory Visit (INDEPENDENT_AMBULATORY_CARE_PROVIDER_SITE_OTHER)

## 2023-10-04 ENCOUNTER — Encounter: Payer: Self-pay | Admitting: Family

## 2023-10-04 ENCOUNTER — Telehealth: Payer: Self-pay

## 2023-10-04 VITALS — BP 131/69 | HR 74 | Temp 97.6°F | Ht 65.0 in | Wt 126.8 lb

## 2023-10-04 DIAGNOSIS — R600 Localized edema: Secondary | ICD-10-CM

## 2023-10-04 DIAGNOSIS — L03116 Cellulitis of left lower limb: Secondary | ICD-10-CM

## 2023-10-04 DIAGNOSIS — M7989 Other specified soft tissue disorders: Secondary | ICD-10-CM | POA: Diagnosis not present

## 2023-10-04 NOTE — Patient Instructions (Addendum)
 Edema perifrico Peripheral Edema  El edema perifrico es la hinchazn causada por la acumulacin de lquido. En la [employer], el edema perifrico afecta la parte inferior de las piernas, los tobillos y los pies. Tambin puede afectar los brazos, las manos y Dance movement psychotherapist. La zona del cuerpo que presenta edema perifrico se ver hinchada. Tambin puede sentirse pesada o caliente. Es posible que sienta que la ropa comienza a apretarle. La presin sobre la zona puede dejar una marca temporal en la piel (edema con fvea). Tal vez no pueda mover el brazo o la pierna hinchados como lo hace habitualmente. Hay muchas causas posibles de edema perifrico. Puede deberse a una complicacin de otras afecciones, como insuficiencia cardaca, enfermedad renal o un problema con la circulacin. Tambin puede ser un efecto secundario de ciertos medicamentos o deberse a una infeccin. Es frecuente durante el embarazo. A veces, la causa es desconocida. Siga estas instrucciones en su casa: Control del dolor, la rigidez y Pharmacist, community (eleve) las piernas mientras est sentado o recostado. Muvase con frecuencia para evitar la rigidez y reducir la hinchazn. No permanezca sentado o de pie durante largos perodos. No use ropa ajustada. No use ligas en la parte superior de las piernas. Ejercite las piernas para Publishing rights manager. Esto ayuda a que el lquido pase nuevamente a sus vasos sanguneos, y puede ayudar a Building services engineer. Use medias de compresin como se lo haya indicado su mdico. Estas medias ayudan a evitar la formacin de cogulos de sangre y a reducir la hinchazn de las piernas. Es importante que sean del tamao correcto. Estas medias deben ser prescritas por su mdico para evitar posibles lesiones. Si le recomiendan vendajes, selos como se lo haya indicado el mdico. Medicamentos Use los medicamentos de venta libre y los recetados solamente como se lo haya indicado el mdico. El  mdico puede recetarle un medicamento para ayudar a que el cuerpo elimine el exceso de agua (diurtico). Tome este medicamento si se lo indican. Instrucciones generales Siga una dieta con poco contenido de sal (sodio) segn las indicaciones del mdico. A veces, disminuir el consumo de sal puede reducir la hinchazn. Est atento a cualquier cambio en los sntomas. Humctese la piel todos los das para evitar que se agriete y se seque. Concurra a todas las visitas de seguimiento. Esto es importante. Comunquese con un mdico si: Tiene fiebre. Tiene hinchazn en una sola pierna. Tiene ms hinchazn, enrojecimiento o dolor en una pierna o en ambas. Tiene secrecin o llagas en la zona donde tiene edema. Solicite ayuda de inmediato si: Tiene un edema que aparece de forma repentina o empeora, en especial, si est embarazada o tiene una enfermedad. Le falta el aire, especialmente al estar acostado. Tiene dolor en el pecho o el abdomen. Se siente dbil. Se siente como si fuera a desmayarse. Estos sntomas pueden Customer service manager. Solicite ayuda de inmediato. Llame al 911. No espere a ver si los sntomas desaparecen. No conduzca por sus propios medios [employer] [facility]. Resumen El edema perifrico es la hinchazn causada por la acumulacin de lquido. En la [employer], el edema perifrico afecta la parte inferior de las piernas, los tobillos y los pies. Muvase con frecuencia para evitar la rigidez y reducir la hinchazn. No permanezca sentado o de pie durante largos perodos. Est atento a cualquier cambio en los sntomas. Comunquese con un mdico si tiene un edema que aparece de forma repentina o empeora, en especial si  usted est embarazada o tiene una afeccin mdica. Busque ayuda de inmediato si le falta el aire, especialmente al estar acostado. Esta informacin no tiene Theme park manager el consejo del mdico. Asegrese de hacerle al mdico cualquier pregunta que  tenga. Document Revised: 10/11/2020 Document Reviewed: 10/11/2020 Elsevier Patient Education  2024 [employer].

## 2023-10-04 NOTE — Progress Notes (Signed)
 Subjective:    Patient ID: Darren Rose, male    DOB: [birth year], 89 y.o.   MRN: [id number]  Chief Complaint  Patient presents with   Leg Injury    SWELLING, RED, SORE TO TOUCH   Pt presents to the office today with left lower leg. 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 He was seen on 08/13 and given Bactrim . He then seen on 09/25/23 and given doxycyline.   Reports his pain is a 0 out 10 when sitting, but if it touched 5 out 10. Reports the redness and is improved, but continues to have swelling.  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  Lives with son and wife.    Social Drivers of Corporate investment banker Strain: Low Risk  (07/16/2023)   Overall Financial Resource Strain (CARDIA)    Difficulty of Paying Living Expenses: Not hard at all  Food Insecurity: No Food Insecurity (07/16/2023)   Hunger Vital Sign    Worried About Running Out of Food in the Last Year: Never true    Ran Out of Food in the Last Year: Never true  Transportation Needs: No Transportation Needs (07/16/2023)   PRAPARE - Administrator, Civil Service (Medical): No    Lack of Transportation (Non-Medical): No  Physical Activity: Insufficiently Active (07/16/2023)   Exercise Vital Sign    Days of Exercise per Week: 7 days    Minutes of Exercise per  Session: 10 min  Stress: No Stress Concern Present (07/16/2023)   [employer] of Occupational Health - Occupational Stress Questionnaire    Feeling of Stress: Not at all  Social Connections: Moderately Isolated (07/16/2023)   Social Connection and Isolation Panel    Frequency of Communication with Friends and Family: More than three times a week    Frequency of Social Gatherings with Friends and Family: More than three times a week    Attends Religious Services: Never    Database administrator or Organizations: No    Attends Engineer, structural: Never    Marital Status: Married   Family History  Problem Relation Age of Onset   Diabetes Sister    Lung cancer Brother    Diabetes Sister         Objective:   Physical Exam    BP 131/69   Pulse 74   Temp 97.6 F (36.4 C)   Ht 5' 5 (1.651 m)   Wt 126 lb 12.8 oz (57.5 kg)   SpO2 99%   BMI 21.10 kg/m      Assessment & Plan:  Darren Rose comes in today with chief complaint of  Leg Injury (SWELLING, RED, SORE TO TOUCH)   Diagnosis and orders addressed:  1. Cellulitis of left lower extremity (Primary) Redness greatly improved Continue doxycycline  After completing if redness, pain increased call office and let me know. I will send in antibiotic.    2. Peripheral edema Discussed compression hose Low salt diet  Elevated feet - Compression stockings  3. Leg swelling - DG Tibia/Fibula Left; Future     [name], FNP

## 2023-10-04 NOTE — Telephone Encounter (Signed)
 Copied from CRM [id number]. Topic: Clinical - Home Health Verbal Orders >> Oct 04, 2023 10:15 AM Delon DASEN wrote: Caller/Agency: Delon with [facility] Callback Number: [phone] Service Requested: nurse through insurance Frequency: n/a Any new concerns about the patient? Yes- patient has cardiac murmur

## 2023-10-04 NOTE — Telephone Encounter (Signed)
 Will discuss at next visit, if he does not have 1 then make one

## 2023-10-05 DIAGNOSIS — M7989 Other specified soft tissue disorders: Secondary | ICD-10-CM | POA: Diagnosis not present

## 2023-10-05 NOTE — Telephone Encounter (Signed)
 Appt made for 9/11. Julio made aware. Reg follow up in Oct.

## 2023-10-11 ENCOUNTER — Encounter: Payer: Self-pay | Admitting: Family Medicine

## 2023-10-11 ENCOUNTER — Ambulatory Visit: Admitting: Family Medicine

## 2023-10-11 VITALS — BP 132/80 | HR 64 | Ht 65.0 in | Wt 127.0 lb

## 2023-10-11 DIAGNOSIS — I10 Essential (primary) hypertension: Secondary | ICD-10-CM | POA: Diagnosis not present

## 2023-10-11 DIAGNOSIS — S8012XD Contusion of left lower leg, subsequent encounter: Secondary | ICD-10-CM

## 2023-10-11 DIAGNOSIS — Z23 Encounter for immunization: Secondary | ICD-10-CM | POA: Diagnosis not present

## 2023-10-11 DIAGNOSIS — R011 Cardiac murmur, unspecified: Secondary | ICD-10-CM

## 2023-10-11 MED ORDER — AMLODIPINE BESYLATE 5 MG PO TABS
5.0000 mg | ORAL_TABLET | Freq: Every day | ORAL | 3 refills | Status: AC
Start: 2023-10-11 — End: ?

## 2023-10-11 MED ORDER — LISINOPRIL 40 MG PO TABS
40.0000 mg | ORAL_TABLET | Freq: Every day | ORAL | 3 refills | Status: AC
Start: 2023-10-11 — End: ?

## 2023-10-11 MED ORDER — IRON (FERROUS SULFATE) 325 (65 FE) MG PO TABS: 325.0000 mg | ORAL_TABLET | Freq: Every day | ORAL | 3 refills | Status: AC

## 2023-10-11 NOTE — Progress Notes (Signed)
 BP 132/80   Pulse 64   Ht 5' 5 (1.651 m)   Wt 127 lb (57.6 kg)   SpO2 99%   BMI 21.13 kg/m    Subjective:   Patient ID: Darren Rose, male    DOB: [birth year], 89 y.o.   MRN: [id number]  HPI: Darren Rose is a 89 y.o. male presenting on 10/11/2023 for Heart Problem ([place]) and peripheral edema   Discussed the use of AI scribe software for clinical note transcription with the patient, who gave verbal consent to proceed.  History of Present Illness   Darren Rose is a 89 year old male who presents with a leg injury and concerns about a heart murmur.  He experienced a leg injury approximately three to four weeks ago, resulting in a persistent hematoma described as a 'pelota' or lump. He finds the compression sock uncomfortable and questions the effectiveness of using ice.  He has a history of a heart murmur identified during an echocardiogram in 2018. He experiences occasional dizziness, described as feeling 'borracho' or drunk, and a sensation of wanting to fall, which occurs sporadically.          Relevant past medical, surgical, family and social history reviewed and updated as indicated. Interim medical history since our last visit reviewed. Allergies and medications reviewed and updated.  Review of Systems  Constitutional:  Negative for chills and fever.  Eyes:  Negative for visual disturbance.  Respiratory:  Negative for shortness of breath and wheezing.   Cardiovascular:  Negative for chest pain and leg swelling.  Musculoskeletal:  Positive for arthralgias. Negative for back pain and gait problem.  Skin:  Negative for rash.  All other systems reviewed and are negative.   Per HPI unless specifically indicated above   Allergies as of 10/11/2023   No Known Allergies      Medication List        Accurate as of October 11, 2023  9:21 AM. If you have any questions, ask your nurse or doctor.          STOP taking these medications     doxycycline  100 MG tablet Commonly known as: VIBRA -TABS Stopped by: Fonda LABOR Aureliano Oshields       TAKE these medications    alfuzosin  10 MG 24 hr tablet Commonly known as: UROXATRAL  TAKE 1 TABLET BY MOUTH EVERY DAY   alfuzosin  10 MG 24 hr tablet Commonly known as: UROXATRAL  Take 1 tablet (10 mg total) by mouth daily.   amLODipine  5 MG tablet Commonly known as: NORVASC  Take 1 tablet (5 mg total) by mouth daily.   aspirin EC 81 MG tablet Take 81 mg by mouth daily.   atorvastatin  40 MG tablet Commonly known as: LIPITOR TAKE 1 TABLET BY MOUTH DAILY AT 6 PM.   clobetasol  cream 0.05 % Commonly known as: TEMOVATE    diclofenac  sodium 1 % Gel Commonly known as: VOLTAREN    finasteride  5 MG tablet Commonly known as: PROSCAR  Take 1 tablet (5 mg total) by mouth daily.   Iron  (Ferrous Sulfate ) 325 (65 Fe) MG Tabs Take 325 mg by mouth daily.   ketoconazole 2 % cream Commonly known as: NIZORAL Apply topically.   lisinopril  40 MG tablet Commonly known as: ZESTRIL  Take 1 tablet (40 mg total) by mouth daily.   loratadine  10 MG tablet Commonly known as: CLARITIN  Take 1 tablet (10 mg total) by mouth daily.   Myrbetriq  50 MG Tb24 tablet Generic drug: mirabegron  ER Take 1 tablet (50 mg total)  by mouth daily.         Objective:   BP 132/80   Pulse 64   Ht 5' 5 (1.651 m)   Wt 127 lb (57.6 kg)   SpO2 99%   BMI 21.13 kg/m   Wt Readings from Last 3 Encounters:  10/11/23 127 lb (57.6 kg)  10/04/23 126 lb 12.8 oz (57.5 kg)  09/25/23 125 lb (56.7 kg)    Physical Exam Physical Exam   CARDIOVASCULAR: Heart murmur present.  Systolic 2 out of 6 diastolic second intercostal space Small hematoma on left shin, appears to be healing well        Assessment & Plan:   Problem List Items Addressed This Visit       Cardiovascular and Mediastinum   Essential hypertension, benign   Relevant Medications   amLODipine  (NORVASC ) 5 MG tablet   lisinopril  (ZESTRIL ) 40 MG  tablet   Other Relevant Orders   ECHOCARDIOGRAM COMPLETE   Other Visit Diagnoses       Leg hematoma, left, subsequent encounter    -  Primary     Systolic murmur       Relevant Orders   ECHOCARDIOGRAM COMPLETE          Lower extremity hematoma Chronic hematoma with pain, persisting for weeks. - Advise alternating heat and cold therapy: 15 minutes each, multiple times daily. - Inform that resolution may take months.  Cardiac murmur Mild murmur likely due to age-related valvular changes, asymptomatic, no progression since 2018 echocardiogram. - Order echocardiogram to reassess murmur. - Discuss potential surgical intervention if significant stenosis develops.  General Health Maintenance Discussed importance of influenza vaccination. - Administer influenza vaccine today.          Follow up plan: Return in about 3 months (around 01/10/2024), or if symptoms worsen or fail to improve, for Hypertension recheck and blood work.  Counseling provided for all of the vaccine components Orders Placed This Encounter  Procedures   ECHOCARDIOGRAM COMPLETE    Fonda Levins, MD [facility] 10/11/2023, 9:21 AM

## 2023-10-15 ENCOUNTER — Ambulatory Visit: Payer: Self-pay | Admitting: Family

## 2023-10-17 ENCOUNTER — Ambulatory Visit: Payer: Self-pay | Admitting: Family Medicine

## 2023-10-17 ENCOUNTER — Ambulatory Visit (HOSPITAL_COMMUNITY)
Admission: RE | Admit: 2023-10-17 | Discharge: 2023-10-17 | Disposition: A | Discharge status: Home / Self Care | Source: Ambulatory Visit | Attending: Family Medicine | Admitting: Family Medicine

## 2023-10-17 DIAGNOSIS — R011 Cardiac murmur, unspecified: Secondary | ICD-10-CM | POA: Diagnosis not present

## 2023-10-17 DIAGNOSIS — I10 Essential (primary) hypertension: Secondary | ICD-10-CM

## 2023-10-17 LAB — ECHOCARDIOGRAM COMPLETE
AR max vel: 1.25 cm2
AV Area VTI: 1.08 cm2
AV Area mean vel: 1.19 cm2
AV Mean grad: 7 mmHg
AV Peak grad: 13.1 mmHg
Ao pk vel: 1.81 m/s
Area-P 1/2: 4.89 cm2
P 1/2 time: 807 ms
S' Lateral: 3.15 cm

## 2023-11-05 ENCOUNTER — Ambulatory Visit: Admitting: Family Medicine

## 2023-12-28 ENCOUNTER — Other Ambulatory Visit: Payer: Self-pay | Admitting: Family Medicine

## 2024-01-09 ENCOUNTER — Ambulatory Visit

## 2024-01-09 ENCOUNTER — Emergency Department (HOSPITAL_BASED_OUTPATIENT_CLINIC_OR_DEPARTMENT_OTHER)
Admission: EM | Admit: 2024-01-09 | Discharge: 2024-01-09 | Disposition: A | Discharge status: Home / Self Care | Attending: Emergency Medicine | Admitting: Emergency Medicine

## 2024-01-09 ENCOUNTER — Ambulatory Visit: Admitting: Nurse Practitioner

## 2024-01-09 ENCOUNTER — Other Ambulatory Visit: Payer: Self-pay

## 2024-01-09 ENCOUNTER — Encounter (HOSPITAL_BASED_OUTPATIENT_CLINIC_OR_DEPARTMENT_OTHER): Payer: Self-pay | Admitting: Emergency Medicine

## 2024-01-09 ENCOUNTER — Ambulatory Visit: Payer: Self-pay

## 2024-01-09 DIAGNOSIS — L03113 Cellulitis of right upper limb: Secondary | ICD-10-CM | POA: Diagnosis present

## 2024-01-09 DIAGNOSIS — E876 Hypokalemia: Secondary | ICD-10-CM | POA: Diagnosis not present

## 2024-01-09 DIAGNOSIS — Z7982 Long term (current) use of aspirin: Secondary | ICD-10-CM | POA: Insufficient documentation

## 2024-01-09 DIAGNOSIS — I10 Essential (primary) hypertension: Secondary | ICD-10-CM | POA: Diagnosis not present

## 2024-01-09 LAB — CBC WITH DIFFERENTIAL/PLATELET
Abs Immature Granulocytes: 0.02 K/uL (ref 0.00–0.07)
Basophils Absolute: 0.1 K/uL (ref 0.0–0.1)
Basophils Relative: 1 %
Eosinophils Absolute: 0.1 K/uL (ref 0.0–0.5)
Eosinophils Relative: 2 %
HCT: 40.8 % (ref 39.0–52.0)
Hemoglobin: 13.3 g/dL (ref 13.0–17.0)
Immature Granulocytes: 0 %
Lymphocytes Relative: 14 %
Lymphs Abs: 1.3 K/uL (ref 0.7–4.0)
MCH: 31.8 pg (ref 26.0–34.0)
MCHC: 32.6 g/dL (ref 30.0–36.0)
MCV: 97.6 fL (ref 80.0–100.0)
Monocytes Absolute: 0.7 K/uL (ref 0.1–1.0)
Monocytes Relative: 7 %
Neutro Abs: 7.2 K/uL (ref 1.7–7.7)
Neutrophils Relative %: 76 %
Platelets: 202 K/uL (ref 150–400)
RBC: 4.18 MIL/uL — ABNORMAL LOW (ref 4.22–5.81)
RDW: 13 % (ref 11.5–15.5)
WBC: 9.4 K/uL (ref 4.0–10.5)
nRBC: 0 % (ref 0.0–0.2)

## 2024-01-09 LAB — URINALYSIS, W/ REFLEX TO CULTURE (INFECTION SUSPECTED)
Bacteria, UA: NONE SEEN
Bilirubin Urine: NEGATIVE
Glucose, UA: NEGATIVE mg/dL
Hgb urine dipstick: NEGATIVE
Ketones, ur: NEGATIVE mg/dL
Leukocytes,Ua: NEGATIVE
Nitrite: NEGATIVE
Protein, ur: NEGATIVE mg/dL
Specific Gravity, Urine: 1.013 (ref 1.005–1.030)
pH: 7 (ref 5.0–8.0)

## 2024-01-09 LAB — COMPREHENSIVE METABOLIC PANEL WITH GFR
ALT: 13 U/L (ref 0–44)
AST: 25 U/L (ref 15–41)
Albumin: 4.2 g/dL (ref 3.5–5.0)
Alkaline Phosphatase: 89 U/L (ref 38–126)
Anion gap: 12 (ref 5–15)
BUN: 12 mg/dL (ref 8–23)
CO2: 27 mmol/L (ref 22–32)
Calcium: 9.8 mg/dL (ref 8.9–10.3)
Chloride: 102 mmol/L (ref 98–111)
Creatinine, Ser: 0.52 mg/dL — ABNORMAL LOW (ref 0.61–1.24)
GFR, Estimated: >60 mL/min (ref >60)
Glucose, Bld: 98 mg/dL (ref 70–99)
Potassium: 3.4 mmol/L — ABNORMAL LOW (ref 3.5–5.1)
Sodium: 141 mmol/L (ref 135–145)
Total Bilirubin: 0.9 mg/dL (ref 0.0–1.2)
Total Protein: 7.5 g/dL (ref 6.5–8.1)

## 2024-01-09 LAB — LACTIC ACID, PLASMA: Lactic Acid, Venous: 1.5 mmol/L (ref 0.5–1.9)

## 2024-01-09 MED ORDER — CEPHALEXIN 500 MG PO CAPS: 500.0000 mg | ORAL_CAPSULE | Freq: Two times a day (BID) | ORAL | 0 refills | Status: AC

## 2024-01-09 MED ORDER — PREDNISONE 20 MG PO TABS
40.0000 mg | ORAL_TABLET | Freq: Once | ORAL | Status: AC
  Administered 2024-01-09: 40 mg via ORAL
  Filled 2024-01-09: qty 2

## 2024-01-09 MED ORDER — PREDNISONE 10 MG PO TABS: 40.0000 mg | ORAL_TABLET | Freq: Every day | ORAL | 0 refills | Status: AC

## 2024-01-09 MED ORDER — IBUPROFEN 400 MG PO TABS
600.0000 mg | ORAL_TABLET | Freq: Once | ORAL | Status: AC
  Administered 2024-01-09: 600 mg via ORAL
  Filled 2024-01-09: qty 1

## 2024-01-09 MED ORDER — CEPHALEXIN 250 MG PO CAPS
500.0000 mg | ORAL_CAPSULE | Freq: Once | ORAL | Status: AC
  Administered 2024-01-09: 500 mg via ORAL
  Filled 2024-01-09: qty 2

## 2024-01-09 NOTE — Telephone Encounter (Signed)
 Noted. Patient is scheduled to be seen today.

## 2024-01-09 NOTE — ED Provider Notes (Incomplete)
 I provided a substantive portion of the care of this patient.  I personally made/approved the management plan for this patient and take responsibility for the patient management. {Remember to document shared critical care using "edcritical" dot phrase:1}

## 2024-01-09 NOTE — ED Triage Notes (Signed)
 Pt via pov from home with right hand swelling since Monday. Pt states he awakened on Monday with the swelling. He reports that it is painful and he has a hard time manipulating the hand and grasping due to the swelling. No known injury. Cap refill and pulses WNL. Pt a&o x 4; presents with son, who helped translate for triage purposes.

## 2024-01-09 NOTE — ED Provider Notes (Signed)
 [place] EMERGENCY DEPARTMENT AT [facility] Provider Note   CSN: [id number] Arrival date & time: 01/09/24  1130     Patient presents with: Arm Swelling   Darren Rose is a 89 y.o. male With history of gout, with history of gout, hypertension, presents with concern for right wrist swelling and redness that started 3 days ago.  Reports that it is somewhat painful to move his wrist.  Denies any injuries to the hand.  Denies any numbness in his fingers.  Reports this feels similar to an episode of gout he had in his hands/wrist previously.   Patient is Spanish-speaking and requested that his son at bedside interpret for him.  He declines a mining engineer.   HPI     Prior to Admission medications   Medication Sig Start Date End Date Taking? Authorizing Provider  cephALEXin  (KEFLEX ) 500 MG capsule Take 1 capsule (500 mg total) by mouth 2 (two) times daily for 7 days. 01/09/24 01/16/24 Yes Veta Palma, PA-C  fluticasone (CUTIVATE) 0.05 % cream Apply 1 Application topically 2 (two) times daily as needed. 11/06/23  Yes [provider]  predniSONE  (DELTASONE ) 10 MG tablet Take 4 tablets (40 mg total) by mouth daily for 4 days. 01/10/24 01/14/24 Yes Veta Palma, PA-C  alfuzosin  (UROXATRAL ) 10 MG 24 hr tablet TAKE 1 TABLET BY MOUTH EVERY DAY 03/29/23   McKenzie, Belvie CROME, MD  alfuzosin  (UROXATRAL ) 10 MG 24 hr tablet Take 1 tablet (10 mg total) by mouth daily. 07/23/23   McKenzie, Belvie CROME, MD  amLODipine  (NORVASC ) 5 MG tablet Take 1 tablet (5 mg total) by mouth daily. 10/11/23   Dettinger, Fonda LABOR, MD  aspirin EC 81 MG tablet Take 81 mg by mouth daily.    [provider]  atorvastatin  (LIPITOR) 40 MG tablet TAKE 1 TABLET BY MOUTH DAILY AT 6 PM. 12/31/23   Dettinger, Fonda LABOR, MD  clobetasol  cream (TEMOVATE ) 0.05 %  12/12/21   [provider]  diclofenac  sodium (VOLTAREN ) 1 % GEL  09/03/18   [provider]  finasteride  (PROSCAR ) 5  MG tablet Take 1 tablet (5 mg total) by mouth daily. 07/23/23   McKenzie, Belvie CROME, MD  Iron , Ferrous Sulfate , 325 (65 Fe) MG TABS Take 325 mg by mouth daily. 10/11/23   Dettinger, Fonda LABOR, MD  ketoconazole (NIZORAL) 2 % cream Apply topically. 01/14/20   [provider]  lisinopril  (ZESTRIL ) 40 MG tablet Take 1 tablet (40 mg total) by mouth daily. 10/11/23   Dettinger, Fonda LABOR, MD  loratadine  (CLARITIN ) 10 MG tablet Take 1 tablet (10 mg total) by mouth daily. 05/04/23   Dettinger, Fonda LABOR, MD  MYRBETRIQ  50 MG TB24 tablet Take 1 tablet (50 mg total) by mouth daily. 07/23/23   McKenzie, Belvie CROME, MD    Allergies: Patient has no known allergies.    Review of Systems  Skin:  Positive for color change.    Updated Vital Signs BP (!) 143/77   Pulse 85   Temp 98.2 F (36.8 C)   Resp 20   Ht 5' 5 (1.651 m)   Wt 57.6 kg   SpO2 100%   BMI 21.13 kg/m   Physical Exam Vitals and nursing note reviewed.  Constitutional:      Appearance: Normal appearance.  HENT:     Head: Atraumatic.  Cardiovascular:     Rate and Rhythm: Normal rate and regular rhythm.     Comments: Brisk cap refill in the digits of the right  hand.  Radial pulse 2+ on the right upper extremity Pulmonary:     Effort: Pulmonary effort is normal.  Musculoskeletal:     Comments: Right upper extremity:  General Erythema and edema that extends from the middle of the right hand to the proximal aspect of the wrist.  Only affects the dorsum. Not significantly hot to touch  Palpation Mild tenderness over the wrist diffusely, no significant tenderness to palpation No point tenderness of the carpal bones diffusely, no snuffbox TTP Nontender over the 1st through 5th metacarpals, 1st through 5th phalanges  ROM Full flexion extension at the wrist Full flexion extension at the 1st through 5th MCPs, PIPs, DIPs  Sensation: Sensation intact throughout the 1st-5th digits   Neurological:     General: No focal deficit  present.     Mental Status: He is alert.  Psychiatric:        Mood and Affect: Mood normal.        Behavior: Behavior normal.       (all labs ordered are listed, but only abnormal results are displayed) Labs Reviewed  COMPREHENSIVE METABOLIC PANEL WITH GFR - Abnormal; Notable for the following components:      Result Value   Potassium 3.4 (*)    Creatinine, Ser 0.52 (*)    All other components within normal limits  CBC WITH DIFFERENTIAL/PLATELET - Abnormal; Notable for the following components:   RBC 4.18 (*)    All other components within normal limits  LACTIC ACID, PLASMA  URINALYSIS, W/ REFLEX TO CULTURE (INFECTION SUSPECTED)    EKG: None  Radiology: No results found.   Procedures   Medications Ordered in the ED  cephALEXin  (KEFLEX ) capsule 500 mg (has no administration in time range)  ibuprofen  (ADVIL ) tablet 600 mg (has no administration in time range)  predniSONE  (DELTASONE ) tablet 40 mg (has no administration in time range)                                    Medical Decision Making Amount and/or Complexity of Data Reviewed Labs: ordered.  Risk Prescription drug management.     Differential diagnosis includes but is not limited to gout, cellulitis, septic arthritis, osteoarthritis, DVT  ED Course:  Upon initial evaluation, patient is well-appearing, no acute distress.  Normal vitals aside from elevated blood pressure 143/77.  Reporting swelling and redness to his right wrist that started 3 days ago.  On exam, he does have erythema that extends from the middle of the right hand down to the right wrist.  Only affects the dorsum of the hand.  There is mild associated edema.  Patient has full range of motion of the right wrist without significant pain or difficulty with range of motion.  Not significantly tender to palpation of the right wrist.  Doubt gout or septic arthritis.  Labs were obtained in triage and patient without leukocytosis.  Normal lactic  acid.  No fever or tachycardia to suggest systemic infection.  Patient denies any injuries to the hand, no point bony tenderness, no indication for x-ray imaging at this time.  Brisk cap refill in the right hand, 2+ radial pulse, no diffuse edema, doubt DVT. My attending Dr. Zavitz also personally evaluated patient, and recommends treatment for cellulitis with 7-day course of Keflex .  He also recommends starting patient on 40 mg of prednisone  for 5 days in case this is gout.  Will have patient follow-up  closely with his PCP.  Patient is stable and appropriate for discharge home  Labs Ordered: I Ordered, and personally interpreted labs.  The pertinent results include:   CBC without leukocytosis, unremarkable CMP with hypokalemia 3.4.  No elevations in creatinine or LFTs. Lactic acid within normal limits Urinalysis without signs of infection  Medications Given: Keflex  Ibuprofen  Prednisone   Impression: Right wrist cellulitis Possible right wrist gout  Disposition:  The patient was discharged home with instructions to take 7-day course of Keflex  as prescribed.  Tylenol and ibuprofen  as needed for pain.  5-day course of prednisone  as prescribed.  Follow-up with PCP within the next 5 days for recheck of the wrist. Return precautions given and patient verbalized understanding.    Record Review: External records from outside source obtained and reviewed including most recent hemoglobin A1c from last year without evidence of diabetes     This chart was dictated using voice recognition software, Dragon. Despite the best efforts of this provider to proofread and correct errors, errors may still occur which can change documentation meaning.       Final diagnoses:  Cellulitis of right wrist    ED Discharge Orders          Ordered    predniSONE  (DELTASONE ) 10 MG tablet  Daily        01/09/24 1427    cephALEXin  (KEFLEX ) 500 MG capsule  2 times daily        01/09/24 1412                Veta Palma, PA-C 01/09/24 1432    Tonia Chew, MD 01/09/24 1547

## 2024-01-09 NOTE — Telephone Encounter (Signed)
 FYI Only or Action Required?: FYI only for provider: appointment scheduled on 01/09/24.  Patient was last seen in primary care on 10/11/2023 by Dettinger, Fonda LABOR, MD.  Called Nurse Triage reporting Hand Pain.  Symptoms began yesterday.  Interventions attempted: Nothing.  Symptoms are: stable.  Triage Disposition: See PCP When Office is Open (Within 3 Days)  Patient/caregiver understands and will follow disposition?: Yes Reason for Disposition  [1] MODERATE pain (e.g., interferes with normal activities) AND [2] present > 3 days  Answer Assessment - Initial Assessment Questions Patient's daughter Darren Rose calling in today. She states it looks red and the whole hand is swollen.   1. ONSET: When did the pain start?     Yesterday  2. LOCATION: Where is the pain located?     Patient's daughter thinks its right hand  3. PAIN: How bad is the pain? (Scale 1-10; or mild, moderate, severe)     Moderate 4. WORK OR EXERCISE: Has there been any recent work or exercise that involved this part (i.e., hand or wrist) of the body?     Denies  5. CAUSE: What do you think is causing the pain?     Unsure  6. AGGRAVATING FACTORS: What makes the pain worse? (e.g., using computer)     Using the hand  7. OTHER SYMPTOMS: Do you have any other symptoms? (e.g., fever, neck pain, numbness or tingling, rash, swelling)     Swelling and redness  Protocols used: Hand Pain-A-AH  Copied from CRM #[id number]. Topic: Clinical - Red Word Triage >> Jan 09, 2024  8:58 AM Darren Rose ORN wrote: Red Word that prompted transfer to Nurse Triage: Patient's daughter, Darren Rose, calling in stating that her dad says his hand is swollen, red, & it hurts. She doesn't know which hand it is, as she's not there with him. She stated this started yesterday and would like to see his provider.

## 2024-01-09 NOTE — Discharge Instructions (Addendum)
 Tiene una infeccin cutnea en la mueca derecha. Est recibiendo tratamiento para esta infeccin con un antibitico llamado cefalexina (Keflex ). Tmelo dos veces al [facility] prximos 7 das. Le administraron la primera dosis hoy aqu. Tome la siguiente dosis esta noche.  Puede tomar hasta 600 mg de ibuprofeno cada 6 horas segn sea necesario para el dolor. No exceda los 2,4 g de ibuprofeno al da. Le administraron la primera dosis hoy aqu. Su siguiente dosis no puede ser antes de las 20:15 de esta noche.  Puede tomar hasta 650 mg de paracetamol cada 6 horas segn sea necesario para el dolor. No tome ms de 4 g al da.  Es menos probable que se trate de gota. Le han recetado un tratamiento con prednisona, un antiinflamatorio, que puede tomar por si acaso para aliviar la inflamacin. Le administraron la primera dosis hoy aqu. 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  Por favor, consulte con su mdico de cabecera la prxima semana para que le revisen la mueca y as asegurarse de que no necesite ms tratamientos.  Por favor, regrese a urgencias si presenta un empeoramiento de la hinchazn o el enrojecimiento, fiebre o cualquier otro sntoma nuevo o preocupante.  You have a skin infection of your right wrist.  You are being treated for this infection with an antibiotic called cephalexin  (Keflex ).  Please take this twice daily for the next 7 days.  You were given your first dose here today.  Take your next dose this evening.   You may use up to 600mg  ibuprofen  every 6 hours as needed for pain.  Do not exceed 2.4g of ibuprofen  per day. You were given your first dose here today. Your next dose can be no sooner than 8:15pm tonight  You may take up to 650mg  of tylenol every 6 hours as needed for pain.  Do not take more then 4g per day.  This is less likely gout.  You have been prescribed a course  of prednisone  which is an anti-inflammatory to take just in case to help the inflammation.  You were given your first dose here today.  Take your next dose starting tomorrow morning.  Take this medication the morning as it can make it harder to fall sleep at night.  Please follow-up with your PCP within the next week for a recheck of your wrist to ensure you do not need any further treatments.   Please return to the ER if you have worsening swelling or redness, fevers, any other new or concerning symptoms

## 2024-01-18 ENCOUNTER — Ambulatory Visit: Payer: Self-pay | Admitting: Family Medicine

## 2024-01-21 ENCOUNTER — Encounter: Payer: Self-pay | Admitting: Family Medicine

## 2024-07-16 ENCOUNTER — Ambulatory Visit: Payer: Self-pay

## 2024-07-23 ENCOUNTER — Ambulatory Visit: Admitting: Urology

## 2024-08-06 ENCOUNTER — Ambulatory Visit
